# Patient Record
Sex: Male | Born: 1990 | Race: White | Hispanic: No | Marital: Single | Smoking: Never smoker
Health system: Southern US, Community
[De-identification: ages and names within clinical notes are randomized; demographics above are authoritative.]

## PROBLEM LIST (undated history)

## (undated) HISTORY — PX: KNEE SURGERY: SHX244

## (undated) HISTORY — PX: INCISION AND DRAINAGE ABSCESS: SHX5864

---
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
## 2012-03-13 ENCOUNTER — Emergency Department (HOSPITAL_COMMUNITY): Admission: EM | Admit: 2012-03-13 | Payer: BC Managed Care – PPO | Source: Home / Self Care
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
## 2012-11-01 ENCOUNTER — Ambulatory Visit
Admission: RE | Admit: 2012-11-01 | Discharge: 2012-11-01 | Disposition: A | Discharge status: Home / Self Care | Payer: No Typology Code available for payment source | Source: Ambulatory Visit | Attending: Physical Medicine and Rehabilitation | Admitting: Physical Medicine and Rehabilitation

## 2012-11-01 ENCOUNTER — Other Ambulatory Visit: Payer: Self-pay | Admitting: Physical Medicine and Rehabilitation

## 2012-11-01 DIAGNOSIS — Z0289 Encounter for other administrative examinations: Secondary | ICD-10-CM

## 2013-03-11 ENCOUNTER — Encounter (HOSPITAL_COMMUNITY): Payer: Self-pay | Admitting: Emergency Medicine

## 2013-03-11 ENCOUNTER — Inpatient Hospital Stay (HOSPITAL_COMMUNITY)
Admission: EM | Admit: 2013-03-11 | Discharge: 2013-03-15 | DRG: 872 | Disposition: A | Discharge status: Home / Self Care | Payer: 59 | Attending: Internal Medicine | Admitting: Internal Medicine

## 2013-03-11 ENCOUNTER — Emergency Department (HOSPITAL_COMMUNITY): Payer: 59

## 2013-03-11 DIAGNOSIS — E109 Type 1 diabetes mellitus without complications: Secondary | ICD-10-CM | POA: Diagnosis present

## 2013-03-11 DIAGNOSIS — E860 Dehydration: Secondary | ICD-10-CM | POA: Diagnosis present

## 2013-03-11 DIAGNOSIS — R748 Abnormal levels of other serum enzymes: Secondary | ICD-10-CM | POA: Diagnosis present

## 2013-03-11 DIAGNOSIS — Z794 Long term (current) use of insulin: Secondary | ICD-10-CM

## 2013-03-11 DIAGNOSIS — R6510 Systemic inflammatory response syndrome (SIRS) of non-infectious origin without acute organ dysfunction: Secondary | ICD-10-CM | POA: Diagnosis present

## 2013-03-11 DIAGNOSIS — B349 Viral infection, unspecified: Secondary | ICD-10-CM

## 2013-03-11 DIAGNOSIS — J209 Acute bronchitis, unspecified: Secondary | ICD-10-CM | POA: Diagnosis present

## 2013-03-11 DIAGNOSIS — B9789 Other viral agents as the cause of diseases classified elsewhere: Secondary | ICD-10-CM | POA: Diagnosis present

## 2013-03-11 DIAGNOSIS — R21 Rash and other nonspecific skin eruption: Secondary | ICD-10-CM

## 2013-03-11 DIAGNOSIS — R197 Diarrhea, unspecified: Secondary | ICD-10-CM

## 2013-03-11 DIAGNOSIS — E119 Type 2 diabetes mellitus without complications: Secondary | ICD-10-CM | POA: Diagnosis present

## 2013-03-11 DIAGNOSIS — R109 Unspecified abdominal pain: Secondary | ICD-10-CM

## 2013-03-11 DIAGNOSIS — A088 Other specified intestinal infections: Secondary | ICD-10-CM | POA: Diagnosis present

## 2013-03-11 DIAGNOSIS — I959 Hypotension, unspecified: Secondary | ICD-10-CM | POA: Diagnosis present

## 2013-03-11 DIAGNOSIS — A419 Sepsis, unspecified organism: Secondary | ICD-10-CM | POA: Diagnosis present

## 2013-03-11 DIAGNOSIS — R1110 Vomiting, unspecified: Secondary | ICD-10-CM

## 2013-03-11 DIAGNOSIS — Z833 Family history of diabetes mellitus: Secondary | ICD-10-CM

## 2013-03-11 DIAGNOSIS — R112 Nausea with vomiting, unspecified: Secondary | ICD-10-CM

## 2013-03-11 LAB — COMPREHENSIVE METABOLIC PANEL
ALT: 44 U/L (ref 0–53)
AST: 64 U/L — ABNORMAL HIGH (ref 0–37)
Albumin: 3.4 g/dL — ABNORMAL LOW (ref 3.5–5.2)
Alkaline Phosphatase: 102 U/L (ref 39–117)
BUN: 20 mg/dL (ref 6–23)
CO2: 22 mEq/L (ref 19–32)
Calcium: 9.1 mg/dL (ref 8.4–10.5)
Chloride: 99 mEq/L (ref 96–112)
Creatinine, Ser: 1.2 mg/dL (ref 0.50–1.35)
GFR calc Af Amer: >90 mL/min (ref >90)
GFR calc non Af Amer: 85 mL/min — ABNORMAL LOW (ref >90)
Glucose, Bld: 263 mg/dL — ABNORMAL HIGH (ref 70–99)
Potassium: 3.4 mEq/L — ABNORMAL LOW (ref 3.7–5.3)
Sodium: 139 mEq/L (ref 137–147)
Total Bilirubin: 0.6 mg/dL (ref 0.3–1.2)
Total Protein: 6.9 g/dL (ref 6.0–8.3)

## 2013-03-11 LAB — BASIC METABOLIC PANEL
BUN: 19 mg/dL (ref 6–23)
CO2: 21 mEq/L (ref 19–32)
Calcium: 7.1 mg/dL — ABNORMAL LOW (ref 8.4–10.5)
Chloride: 102 mEq/L (ref 96–112)
Creatinine, Ser: 1.12 mg/dL (ref 0.50–1.35)
GFR calc Af Amer: >90 mL/min (ref >90)
GFR calc non Af Amer: >90 mL/min (ref >90)
Glucose, Bld: 277 mg/dL — ABNORMAL HIGH (ref 70–99)
Potassium: 3.5 mEq/L — ABNORMAL LOW (ref 3.7–5.3)
Sodium: 134 mEq/L — ABNORMAL LOW (ref 137–147)

## 2013-03-11 LAB — CBC WITH DIFFERENTIAL/PLATELET
Basophils Absolute: 0 10*3/uL (ref 0.0–0.1)
Basophils Relative: 0 % (ref 0–1)
Eosinophils Absolute: 0.1 10*3/uL (ref 0.0–0.7)
Eosinophils Relative: 1 % (ref 0–5)
HCT: 45.4 % (ref 39.0–52.0)
Hemoglobin: 16.1 g/dL (ref 13.0–17.0)
Lymphocytes Relative: 4 % — ABNORMAL LOW (ref 12–46)
Lymphs Abs: 0.3 10*3/uL — ABNORMAL LOW (ref 0.7–4.0)
MCH: 31.6 pg (ref 26.0–34.0)
MCHC: 35.5 g/dL (ref 30.0–36.0)
MCV: 89 fL (ref 78.0–100.0)
Monocytes Absolute: 0.5 10*3/uL (ref 0.1–1.0)
Monocytes Relative: 6 % (ref 3–12)
Neutro Abs: 7.3 10*3/uL (ref 1.7–7.7)
Neutrophils Relative %: 90 % — ABNORMAL HIGH (ref 43–77)
Platelets: 213 10*3/uL (ref 150–400)
RBC: 5.1 MIL/uL (ref 4.22–5.81)
RDW: 13.4 % (ref 11.5–15.5)
WBC: 8.2 10*3/uL (ref 4.0–10.5)

## 2013-03-11 LAB — URINALYSIS, ROUTINE W REFLEX MICROSCOPIC
Glucose, UA: 250 mg/dL — AB
Hgb urine dipstick: NEGATIVE
Leukocytes, UA: NEGATIVE
Nitrite: NEGATIVE
Protein, ur: 100 mg/dL — AB
Specific Gravity, Urine: 1.015 (ref 1.005–1.030)
Urobilinogen, UA: 0.2 mg/dL (ref 0.0–1.0)
pH: 6 (ref 5.0–8.0)

## 2013-03-11 LAB — URINE MICROSCOPIC-ADD ON

## 2013-03-11 LAB — CG4 I-STAT (LACTIC ACID): Lactic Acid, Venous: 1.49 mmol/L (ref 0.5–2.2)

## 2013-03-11 LAB — LIPASE, BLOOD: Lipase: 152 U/L — ABNORMAL HIGH (ref 11–59)

## 2013-03-11 LAB — GLUCOSE, CAPILLARY: Glucose-Capillary: 221 mg/dL — ABNORMAL HIGH (ref 70–99)

## 2013-03-11 MED ORDER — ENOXAPARIN SODIUM 40 MG/0.4ML ~~LOC~~ SOLN
40.0000 mg | SUBCUTANEOUS | Status: DC
  Administered 2013-03-12 – 2013-03-13 (×2): 40 mg via SUBCUTANEOUS
  Filled 2013-03-11 (×2): qty 0.4

## 2013-03-11 MED ORDER — INSULIN ASPART 100 UNIT/ML ~~LOC~~ SOLN
0.0000 [IU] | Freq: Every day | SUBCUTANEOUS | Status: DC
  Administered 2013-03-12 – 2013-03-13 (×2): 2 [IU] via SUBCUTANEOUS
  Administered 2013-03-14: 3 [IU] via SUBCUTANEOUS

## 2013-03-11 MED ORDER — SODIUM CHLORIDE 0.9 % IV SOLN
1000.0000 mL | INTRAVENOUS | Status: DC
  Administered 2013-03-12 (×2): 1000 mL via INTRAVENOUS

## 2013-03-11 MED ORDER — ONDANSETRON HCL 4 MG PO TABS: 4.0000 mg | ORAL_TABLET | Freq: Four times a day (QID) | ORAL | Status: DC

## 2013-03-11 MED ORDER — ONDANSETRON HCL 4 MG/2ML IJ SOLN: 4.0000 mg | Freq: Three times a day (TID) | INTRAMUSCULAR | Status: AC | PRN

## 2013-03-11 MED ORDER — POTASSIUM CHLORIDE CRYS ER 20 MEQ PO TBCR
40.0000 meq | EXTENDED_RELEASE_TABLET | ORAL | Status: AC
  Administered 2013-03-12 (×2): 40 meq via ORAL
  Filled 2013-03-11 (×2): qty 2

## 2013-03-11 MED ORDER — ONDANSETRON HCL 4 MG/2ML IJ SOLN
4.0000 mg | Freq: Once | INTRAMUSCULAR | Status: AC
  Administered 2013-03-11: 4 mg via INTRAVENOUS
  Filled 2013-03-11: qty 2

## 2013-03-11 MED ORDER — INSULIN ASPART 100 UNIT/ML ~~LOC~~ SOLN
0.0000 [IU] | Freq: Three times a day (TID) | SUBCUTANEOUS | Status: DC
  Administered 2013-03-12: 2 [IU] via SUBCUTANEOUS
  Administered 2013-03-12: 5 [IU] via SUBCUTANEOUS
  Administered 2013-03-12: 3 [IU] via SUBCUTANEOUS
  Administered 2013-03-13: 2 [IU] via SUBCUTANEOUS
  Administered 2013-03-13 (×2): 3 [IU] via SUBCUTANEOUS
  Administered 2013-03-14: 2 [IU] via SUBCUTANEOUS
  Administered 2013-03-14 (×2): 3 [IU] via SUBCUTANEOUS
  Administered 2013-03-15: 2 [IU] via SUBCUTANEOUS

## 2013-03-11 MED ORDER — IBUPROFEN 800 MG PO TABS
800.0000 mg | ORAL_TABLET | Freq: Once | ORAL | Status: AC
  Administered 2013-03-11: 800 mg via ORAL
  Filled 2013-03-11: qty 1

## 2013-03-11 MED ORDER — ACETAMINOPHEN 650 MG RE SUPP: 650.0000 mg | Freq: Four times a day (QID) | RECTAL | Status: DC | PRN

## 2013-03-11 MED ORDER — SODIUM CHLORIDE 0.9 % IV SOLN
1000.0000 mL | Freq: Once | INTRAVENOUS | Status: AC
  Administered 2013-03-11: 1000 mL via INTRAVENOUS

## 2013-03-11 MED ORDER — OXYCODONE HCL 5 MG PO TABS: 5.0000 mg | ORAL_TABLET | ORAL | Status: DC | PRN

## 2013-03-11 MED ORDER — IOHEXOL 300 MG/ML  SOLN
50.0000 mL | Freq: Once | INTRAMUSCULAR | Status: AC | PRN
  Administered 2013-03-11: 50 mL via ORAL

## 2013-03-11 MED ORDER — DIPHENOXYLATE-ATROPINE 2.5-0.025 MG PO TABS: 2.0000 | ORAL_TABLET | Freq: Four times a day (QID) | ORAL | Status: DC | PRN

## 2013-03-11 MED ORDER — MORPHINE SULFATE 4 MG/ML IJ SOLN
4.0000 mg | Freq: Once | INTRAMUSCULAR | Status: AC
  Administered 2013-03-11: 4 mg via INTRAVENOUS
  Filled 2013-03-11: qty 1

## 2013-03-11 MED ORDER — SODIUM CHLORIDE 0.9 % IV SOLN: INTRAVENOUS | Status: DC

## 2013-03-11 MED ORDER — SODIUM CHLORIDE 0.9 % IV BOLUS (SEPSIS)
1000.0000 mL | Freq: Once | INTRAVENOUS | Status: AC
  Administered 2013-03-11: 1000 mL via INTRAVENOUS

## 2013-03-11 MED ORDER — SODIUM CHLORIDE 0.9 % IV SOLN
1000.0000 mL | Freq: Once | INTRAVENOUS | Status: AC
  Administered 2013-03-11 – 2013-03-12 (×2): 1000 mL via INTRAVENOUS

## 2013-03-11 MED ORDER — OSELTAMIVIR PHOSPHATE 75 MG PO CAPS
75.0000 mg | ORAL_CAPSULE | Freq: Two times a day (BID) | ORAL | Status: DC
  Administered 2013-03-11: 75 mg via ORAL
  Filled 2013-03-11: qty 1

## 2013-03-11 MED ORDER — ACETAMINOPHEN 500 MG PO TABS
1000.0000 mg | ORAL_TABLET | Freq: Once | ORAL | Status: AC
  Administered 2013-03-11: 1000 mg via ORAL
  Filled 2013-03-11: qty 2

## 2013-03-11 MED ORDER — IOHEXOL 300 MG/ML  SOLN
100.0000 mL | Freq: Once | INTRAMUSCULAR | Status: AC | PRN
  Administered 2013-03-11: 100 mL via INTRAVENOUS

## 2013-03-11 MED ORDER — INSULIN GLARGINE 100 UNIT/ML ~~LOC~~ SOLN
34.0000 [IU] | Freq: Every day | SUBCUTANEOUS | Status: DC
  Administered 2013-03-12 – 2013-03-14 (×4): 34 [IU] via SUBCUTANEOUS
  Filled 2013-03-11 (×4): qty 0.34

## 2013-03-11 MED ORDER — ACETAMINOPHEN 325 MG PO TABS
650.0000 mg | ORAL_TABLET | Freq: Four times a day (QID) | ORAL | Status: DC | PRN
  Administered 2013-03-12 – 2013-03-15 (×7): 650 mg via ORAL
  Filled 2013-03-11 (×7): qty 2

## 2013-03-11 NOTE — ED Provider Notes (Addendum)
CSN: [id number]     Arrival date & time 03/11/13  1518 History   First MD Initiated Contact with Patient 03/11/13 1600     Chief Complaint  Patient presents with  . Fever  . Emesis     (Consider location/radiation/quality/duration/timing/severity/associated sxs/prior Treatment) HPI Comments: Patient presents to ER for evaluation of nausea and vomiting. Patient reports that he awakened this morning with fever, chills, body aches. He developed headache, cough and then noticed vomiting and diarrhea. Symptoms have persisted through the course of today. She reports diffuse abdominal cramping which is waxing and waning in nature. He is a diabetic, blood sugars have been slightly elevated.  Patient is a 23 y.o. male presenting with fever and vomiting.  Fever Associated symptoms: chills, cough, diarrhea, myalgias, nausea and vomiting   Emesis Associated symptoms: abdominal pain, chills, diarrhea and myalgias     Past Medical History  Diagnosis Date  . Diabetes mellitus     insulin pump   History reviewed. No pertinent past surgical history. Family History  Problem Relation Age of Onset  . Diabetes     History  Substance Use Topics  . Smoking status: Never Smoker   . Smokeless tobacco: Never Used  . Alcohol Use: No    Review of Systems  Constitutional: Positive for fever and chills.  Respiratory: Positive for cough.   Gastrointestinal: Positive for nausea, vomiting, abdominal pain and diarrhea.  Musculoskeletal: Positive for myalgias.  All other systems reviewed and are negative.      Allergies  Amoxicillin  Home Medications   Current Outpatient Rx  Name  Route  Sig  Dispense  Refill  . insulin glargine (LANTUS) 100 UNIT/ML injection   Subcutaneous   Inject 34 Units into the skin at bedtime.         . insulin glulisine (APIDRA) 100 UNIT/ML injection   Subcutaneous   Inject 10-16 Units into the skin 3 (three) times daily before meals. According to sliding scale at  home         . diphenoxylate-atropine (LOMOTIL) 2.5-0.025 MG per tablet   Oral   Take 2 tablets by mouth 4 (four) times daily as needed for diarrhea or loose stools.   15 tablet   0   . ondansetron (ZOFRAN) 4 MG tablet   Oral   Take 1 tablet (4 mg total) by mouth every 6 (six) hours.   12 tablet   0    BP 92/32  Pulse 132  Temp(Src) 101.7 F (38.7 C) (Oral)  Resp 18  Ht 5\' 7"  (1.702 m)  Wt 165 lb (74.844 kg)  BMI 25.84 kg/m2  SpO2 99% Physical Exam  Constitutional: He is oriented to person, place, and time. He appears well-developed and well-nourished. No distress.  HENT:  Head: Normocephalic and atraumatic.  Right Ear: Hearing normal.  Left Ear: Hearing normal.  Nose: Nose normal.  Mouth/Throat: Oropharynx is clear and moist and mucous membranes are normal.  Eyes: Conjunctivae and EOM are normal. Pupils are equal, round, and reactive to light.  Neck: Normal range of motion. Neck supple.  Cardiovascular: Regular rhythm, S1 normal and S2 normal.  Exam reveals no gallop and no friction rub.   No murmur heard. Pulmonary/Chest: Effort normal and breath sounds normal. No respiratory distress. He exhibits no tenderness.  Abdominal: Soft. Normal appearance and bowel sounds are normal. There is no hepatosplenomegaly. There is tenderness in the right upper quadrant, epigastric area, periumbilical area and left upper quadrant. There is no rebound, no  guarding, no tenderness at McBurney's point and negative Murphy's sign. No hernia.  Musculoskeletal: Normal range of motion.  Neurological: He is alert and oriented to person, place, and time. He has normal strength. No cranial nerve deficit or sensory deficit. Coordination normal. GCS eye subscore is 4. GCS verbal subscore is 5. GCS motor subscore is 6.  Skin: Skin is warm, dry and intact. No rash noted. No cyanosis.  Psychiatric: He has a normal mood and affect. His speech is normal and behavior is normal. Thought content normal.     ED Course  Procedures (including critical care time) Labs Review Labs Reviewed  GLUCOSE, CAPILLARY - Abnormal; Notable for the following:    Glucose-Capillary 221 (*)    All other components within normal limits  CBC WITH DIFFERENTIAL - Abnormal; Notable for the following:    Neutrophils Relative % 90 (*)    Lymphocytes Relative 4 (*)    Lymphs Abs 0.3 (*)    All other components within normal limits  COMPREHENSIVE METABOLIC PANEL - Abnormal; Notable for the following:    Potassium 3.4 (*)    Glucose, Bld 263 (*)    Albumin 3.4 (*)    AST 64 (*)    GFR calc non Af Amer 85 (*)    All other components within normal limits  LIPASE, BLOOD - Abnormal; Notable for the following:    Lipase 152 (*)    All other components within normal limits  URINALYSIS, ROUTINE W REFLEX MICROSCOPIC - Abnormal; Notable for the following:    Color, Urine ORANGE (*)    Glucose, UA 250 (*)    Bilirubin Urine SMALL (*)    Ketones, ur TRACE (*)    Protein, ur 100 (*)    All other components within normal limits  URINE MICROSCOPIC-ADD ON - Abnormal; Notable for the following:    Casts HYALINE CASTS (*)    All other components within normal limits  CULTURE, BLOOD (ROUTINE X 2)  CULTURE, BLOOD (ROUTINE X 2)  CG4 I-STAT (LACTIC ACID)   Imaging Review Dg Chest 2 View  03/11/2013   CLINICAL DATA:  Stomach ache, vomiting, cough  EXAM: CHEST  2 VIEW  COMPARISON:  DG CHEST 1 VIEW dated 11/01/2012; DG CHEST 2 VIEW dated 06/28/2011  FINDINGS: Unchanged cardiac silhouette and mediastinal contours. No focal airspace opacities. No pleural effusion or pneumothorax. No evidence of edema. Unchanged bones.  IMPRESSION: No acute cardiopulmonary disease.   Electronically Signed   By: Simonne Come M.D.   On: 03/11/2013 21:40   Ct Abdomen Pelvis W Contrast  03/11/2013   CLINICAL DATA:  Abdominal pain.  EXAM: CT ABDOMEN AND PELVIS WITH CONTRAST  TECHNIQUE: Multidetector CT imaging of the abdomen and pelvis was performed  using the standard protocol following bolus administration of intravenous contrast.  CONTRAST:  50mL OMNIPAQUE IOHEXOL 300 MG/ML SOLN, OMNIPAQUE IOHEXOL 300 MG/ML SOLN  COMPARISON:  CT scan of April 09, 2010.  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 Visualized lung bases appear normal.  The liver, spleen and pancreas appear normal. No gallstones are noted. Adrenal glands appear normal. No hydronephrosis or renal obstruction is noted. Small nonobstructive calculus is seen in left kidney. No ureteral calculi are noted. The appendix appears normal. There is no evidence of bowel obstruction. Upper abdominal and mesenteric adenopathy is again noted and unchanged compared to prior exam. Bilateral inguinal adenopathy is also noted and unchanged. There is continued uniform wall thickening of the urinary  bladder whichwas present on prior exam. No abnormal fluid collection is noted.  IMPRESSION: Small nonobstructive left renal calculus. No hydronephrosis or renal obstruction is seen.  Stable adenopathy is noted in the retroperitoneal, mesenteric and bilateral inguinal regions which is unchanged compared to prior exam of 2012, and therefore most likely is inflammatory or reactive in nature.  Uniform wall thickening of urinary bladder is noted which was present on prior exam ; this is concerning for chronic cystitis or inflammation.   Electronically Signed   By: Roque LiasJames  Green M.D.   On: 03/11/2013 19:07    EKG Interpretation    Date/Time:  Friday March 11 2013 16:17:59 EST Ventricular Rate:  131 PR Interval:  150 QRS Duration: 98 QT Interval:  288 QTC Calculation: 425 R Axis:   52 Text Interpretation:  Sinus tachycardia Nonspecific ST and T wave abnormality Abnormal ECG When compared with ECG of 27-Dec-2007 08:40,  tachycardia is present Confirmed by Greenleigh Kauth  MD, Atziri Zubiate ([id number]) on 03/11/2013 5:32:06 PM            MDM   Final diagnoses:  Viral Syndrome Dehydration    Patient presents to ER with flulike symptoms. He denies fever, chills, myalgias, headache, respiratory symptoms as well as vomiting and diarrhea. Symptoms began early this morning. Patient was slightly hyperglycemic, does have a history of diabetes. Patient treated with IV fluids and Zofran, analgesia. Blood work is essentially unremarkable other than slightly elevated lipase. This has been seen previously in the patient, but because of this, CAT scan was obtained. No acute abnormalities are seen. Patient does not have any pain or tenderness globally about his, low suspicion for appendicitis. CAT scan did not show any inflammatory changes other than thickening of the bladder wall which has been seen previously.   Patient continued to be febrile here in the ER despite treatment. I believe this is the cause of the patient's tachycardia. After 2 L of fluid he was feeling much improved. Blood pressure is on the low side, but looking back at previous visits he has had a pressure in the 90s to 100 frequently during his visits. His lactate is negative. I do not think he is septic. Blood cultures are pending, will be followed.  Patient indicated that he was feeling better, but still was hypotensive, blood pressure in the 80s. Additionally he remained tachycardic. After a second liter of fluid, patient was ambulated. His blood pressure did not drop however he became significantly tachycardic, about 160 with standing. This indicates significant dehydration and I recommended admission to the patient for further hydration.    Gilda Creasehristopher J. Dalaysia Harms, MD 03/11/13 [id number]  Gilda Creasehristopher J. Takoya Jonas, MD 03/11/13 2200  Gilda Creasehristopher J. Ardith Lewman, MD 03/11/13 2223

## 2013-03-11 NOTE — ED Notes (Addendum)
Patient c/o flu like symptoms- body aches, vomiting, diarrhea, fevers, headache, and coughing that started this morning . Per patient sputum thick and yellow. Per father patient was fatigue yesterday.

## 2013-03-11 NOTE — H&P (Signed)
PCP:   WILLARD,JENNIFER, PA-C   Chief Complaint:  Nausea vomiting  HPI:  23 year old male with a past medical history of diabetes mellitus, who came to the ED after patient started having symptoms of cough, nausea vomiting and diarrhea which started this morning. As per patient he noticed that he was coughing up yellow phlegm and then had almost 20 episodes of vomiting and 3 episodes of diarrhea. He denies any blood in the vomitus or stool. Denies any sick contacts at home. He denies any chest pain or shortness of breath associated with them. He did have abdominal pain which was cramping intact her and 5/10 in intensity located around the umbilicus. Patient also has diabetes and takes Lantus and sliding scale Apidra at home, In the ED CAT scan of the abdomen was done which did not show significant abnormality. Chest x-ray is normal, patient continues to be hypotensive despite getting IV fluids boluses. Patient also has lactic acid of 1.49. WBC is normal, has mild elevation of lipase 152. Blood glucose is 263. He admits to having fever, temperature  is 101.7.  Allergies:   Allergies  Allergen Reactions  . Amoxicillin Rash      Past Medical History  Diagnosis Date  . Diabetes mellitus     insulin pump    History reviewed. No pertinent past surgical history.  Prior to Admission medications   Medication Sig Start Date End Date Taking? Authorizing Provider  insulin glargine (LANTUS) 100 UNIT/ML injection Inject 34 Units into the skin at bedtime.   Yes Historical Provider, MD  insulin glulisine (APIDRA) 100 UNIT/ML injection Inject 10-16 Units into the skin 3 (three) times daily before meals. According to sliding scale at home   Yes Historical Provider, MD    Social History:  reports that he has never smoked. He has never used smokeless tobacco. He reports that he does not drink alcohol or use illicit drugs.  Family History  Problem Relation Age of Onset  . Diabetes       All the  positives are listed in BOLD  Review of Systems:  HEENT: Headache, blurred vision, runny nose, sore throat Neck: Hypothyroidism, hyperthyroidism,,lymphadenopathy Chest : Shortness of breath, history of COPD, Asthma Heart : Chest pain, history of coronary arterey disease GI:  Nausea, vomiting, diarrhea, constipation, GERD GU: Dysuria, urgency, frequency of urination, hematuria Neuro: Stroke, seizures, syncope Psych: Depression, anxiety, hallucinations   Physical Exam: Blood pressure 82/52, pulse 161, temperature 101.7 F (38.7 C), temperature source Oral, resp. rate 18, height 5' 7"  (1.702 m), weight 74.844 kg (165 lb), SpO2 99.00%. Constitutional:   Patient is a well-developed and well-nourished male* in no acute distress and cooperative with exam. Head: Normocephalic and atraumatic Mouth: Mucus membranes moist Eyes: PERRL, EOMI, conjunctivae normal Neck: Supple, No Thyromegaly Cardiovascular: RRR, S1 normal, S2 normal Pulmonary/Chest: CTAB, no wheezes, rales, or rhonchi Abdominal: Soft. Non-tender, non-distended, bowel sounds are normal, no masses, organomegaly, or guarding present.  Neurological: A&O x3, Strenght is normal and symmetric bilaterally, cranial nerve II-XII are grossly intact, no focal motor deficit, sensory intact to light touch bilaterally.  Extremities : No Cyanosis, Clubbing or Edema   Labs on Admission:  Results for orders placed during the hospital encounter of 03/11/13 (from the past 48 hour(s))  URINALYSIS, ROUTINE W REFLEX MICROSCOPIC     Status: Abnormal   Collection Time    03/11/13  3:30 PM      Result Value Ref Range   Color, Urine ORANGE (*) YELLOW  Comment: BIOCHEMICALS MAY BE AFFECTED BY COLOR   APPearance CLEAR  CLEAR   Specific Gravity, Urine 1.015  1.005 - 1.030   pH 6.0  5.0 - 8.0   Glucose, UA 250 (*) NEGATIVE mg/dL   Hgb urine dipstick NEGATIVE  NEGATIVE   Bilirubin Urine SMALL (*) NEGATIVE   Ketones, ur TRACE (*) NEGATIVE mg/dL    Protein, ur 100 (*) NEGATIVE mg/dL   Urobilinogen, UA 0.2  0.0 - 1.0 mg/dL   Nitrite NEGATIVE  NEGATIVE   Leukocytes, UA NEGATIVE  NEGATIVE  URINE MICROSCOPIC-ADD ON     Status: Abnormal   Collection Time    03/11/13  3:30 PM      Result Value Ref Range   WBC, UA 3-6  <3 WBC/hpf   Bacteria, UA RARE  RARE   Casts HYALINE CASTS (*) NEGATIVE  GLUCOSE, CAPILLARY     Status: Abnormal   Collection Time    03/11/13  4:01 PM      Result Value Ref Range   Glucose-Capillary 221 (*) 70 - 99 mg/dL  CBC WITH DIFFERENTIAL     Status: Abnormal   Collection Time    03/11/13  4:28 PM      Result Value Ref Range   WBC 8.2  4.0 - 10.5 K/uL   RBC 5.10  4.22 - 5.81 MIL/uL   Hemoglobin 16.1  13.0 - 17.0 g/dL   HCT 45.4  39.0 - 52.0 %   MCV 89.0  78.0 - 100.0 fL   MCH 31.6  26.0 - 34.0 pg   MCHC 35.5  30.0 - 36.0 g/dL   RDW 13.4  11.5 - 15.5 %   Platelets 213  150 - 400 K/uL   Neutrophils Relative % 90 (*) 43 - 77 %   Neutro Abs 7.3  1.7 - 7.7 K/uL   Lymphocytes Relative 4 (*) 12 - 46 %   Lymphs Abs 0.3 (*) 0.7 - 4.0 K/uL   Monocytes Relative 6  3 - 12 %   Monocytes Absolute 0.5  0.1 - 1.0 K/uL   Eosinophils Relative 1  0 - 5 %   Eosinophils Absolute 0.1  0.0 - 0.7 K/uL   Basophils Relative 0  0 - 1 %   Basophils Absolute 0.0  0.0 - 0.1 K/uL  COMPREHENSIVE METABOLIC PANEL     Status: Abnormal   Collection Time    03/11/13  4:28 PM      Result Value Ref Range   Sodium 139  137 - 147 mEq/L   Potassium 3.4 (*) 3.7 - 5.3 mEq/L   Chloride 99  96 - 112 mEq/L   CO2 22  19 - 32 mEq/L   Glucose, Bld 263 (*) 70 - 99 mg/dL   BUN 20  6 - 23 mg/dL   Creatinine, Ser 1.20  0.50 - 1.35 mg/dL   Calcium 9.1  8.4 - 10.5 mg/dL   Total Protein 6.9  6.0 - 8.3 g/dL   Albumin 3.4 (*) 3.5 - 5.2 g/dL   AST 64 (*) 0 - 37 U/L   ALT 44  0 - 53 U/L   Alkaline Phosphatase 102  39 - 117 U/L   Total Bilirubin 0.6  0.3 - 1.2 mg/dL   GFR calc non Af Amer 85 (*) >90 mL/min   GFR calc Af Amer >90  >90 mL/min    Comment: (NOTE)     The eGFR has been calculated using the CKD EPI equation.  This calculation has not been validated in all clinical situations.     eGFR's persistently <90 mL/min signify possible Chronic Kidney     Disease.  LIPASE, BLOOD     Status: Abnormal   Collection Time    03/11/13  4:28 PM      Result Value Ref Range   Lipase 152 (*) 11 - 59 U/L  CG4 I-STAT (LACTIC ACID)     Status: None   Collection Time    03/11/13  9:36 PM      Result Value Ref Range   Lactic Acid, Venous 1.49  0.5 - 2.2 mmol/L    Radiological Exams on Admission: Dg Chest 2 View  03/11/2013   CLINICAL DATA:  Stomach ache, vomiting, cough  EXAM: CHEST  2 VIEW  COMPARISON:  DG CHEST 1 VIEW dated 11/01/2012; DG CHEST 2 VIEW dated 06/28/2011  FINDINGS: Unchanged cardiac silhouette and mediastinal contours. No focal airspace opacities. No pleural effusion or pneumothorax. No evidence of edema. Unchanged bones.  IMPRESSION: No acute cardiopulmonary disease.   Electronically Signed   By: Sandi Mariscal M.D.   On: 03/11/2013 21:40   Ct Abdomen Pelvis W Contrast  03/11/2013   CLINICAL DATA:  Abdominal pain.  EXAM: CT ABDOMEN AND PELVIS WITH CONTRAST  TECHNIQUE: Multidetector CT imaging of the abdomen and pelvis was performed using the standard protocol following bolus administration of intravenous contrast.  CONTRAST:  25m OMNIPAQUE IOHEXOL 300 MG/ML SOLN, 107mOMNIPAQUE IOHEXOL 300 MG/ML SOLN  COMPARISON:  CT scan of April 09, 2010.  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 Visualized lung bases appear normal.  The liver, spleen and pancreas appear normal. No gallstones are noted. Adrenal glands appear normal. No hydronephrosis or renal obstruction is noted. Small nonobstructive calculus is seen in left kidney. No ureteral calculi are noted. The appendix appears normal. There is no evidence of bowel obstruction. Upper abdominal and mesenteric adenopathy is again noted and unchanged compared to prior  exam. Bilateral inguinal adenopathy is also noted and unchanged. There is continued uniform wall thickening of the urinary bladder whichwas present on prior exam. No abnormal fluid collection is noted.  IMPRESSION: Small nonobstructive left renal calculus. No hydronephrosis or renal obstruction is seen.  Stable adenopathy is noted in the retroperitoneal, mesenteric and bilateral inguinal regions which is unchanged compared to prior exam of 2012, and therefore most likely is inflammatory or reactive in nature.  Uniform wall thickening of urinary bladder is noted which was present on prior exam ; this is concerning for chronic cystitis or inflammation.   Electronically Signed   By: JaSabino Dick.D.   On: 03/11/2013 19:07    Assessment/Plan Active Problems:   Acute bronchitis   Dehydration   DM (diabetes mellitus)  Dehydration Patient presented with nausea vomiting, has profound dehydration with hypotension. He has received 2 L of normal saline in the ED, we'll continue normal saline at 125 mL per hour. Patient will be admitted to the step down unit Patient's chloride was 99, bicarbonate 22. Sodium 139, chloride is low with high sodium due to dehydration, glucose 223. Does not appear to be DKA. Will repeat BMP stat.  Acute bronchitis Patient presented with symptoms of cough with phlegm, we'll start the patient empirically on Levaquin. Influenza PCR has been obtained, will also start Tamiflu empirically.  Diabetes mellitus Continue her Lantus 34 units at bedtime, and initiate sliding scale insulin.   DVT prophylaxis Lovenox   Code status: Full  code  Family discussion: Discussed with patient's father at bedside   Time Spent on Admission: 60 minutes  [facility] Pager: [phone] 03/11/2013, 10:58 PM  If 7PM-7AM, please contact night-coverage  [email or web address]  Password [email or web address]  And

## 2013-03-12 ENCOUNTER — Encounter (HOSPITAL_COMMUNITY): Payer: Self-pay | Admitting: *Deleted

## 2013-03-12 ENCOUNTER — Observation Stay (HOSPITAL_COMMUNITY): Payer: 59

## 2013-03-12 DIAGNOSIS — R197 Diarrhea, unspecified: Secondary | ICD-10-CM | POA: Diagnosis present

## 2013-03-12 DIAGNOSIS — R748 Abnormal levels of other serum enzymes: Secondary | ICD-10-CM | POA: Diagnosis not present

## 2013-03-12 DIAGNOSIS — R6510 Systemic inflammatory response syndrome (SIRS) of non-infectious origin without acute organ dysfunction: Secondary | ICD-10-CM | POA: Diagnosis present

## 2013-03-12 DIAGNOSIS — I959 Hypotension, unspecified: Secondary | ICD-10-CM | POA: Diagnosis present

## 2013-03-12 DIAGNOSIS — R112 Nausea with vomiting, unspecified: Secondary | ICD-10-CM | POA: Diagnosis present

## 2013-03-12 LAB — CBC
HCT: 37.5 % — ABNORMAL LOW (ref 39.0–52.0)
Hemoglobin: 13.2 g/dL (ref 13.0–17.0)
MCH: 31.3 pg (ref 26.0–34.0)
MCHC: 35.2 g/dL (ref 30.0–36.0)
MCV: 88.9 fL (ref 78.0–100.0)
Platelets: 157 10*3/uL (ref 150–400)
RBC: 4.22 MIL/uL (ref 4.22–5.81)
RDW: 13.6 % (ref 11.5–15.5)
WBC: 8.9 10*3/uL (ref 4.0–10.5)

## 2013-03-12 LAB — RAPID URINE DRUG SCREEN, HOSP PERFORMED
Amphetamines: NOT DETECTED
Barbiturates: NOT DETECTED
Benzodiazepines: NOT DETECTED
Cocaine: NOT DETECTED
Opiates: NOT DETECTED
Tetrahydrocannabinol: NOT DETECTED

## 2013-03-12 LAB — HEPATITIS PANEL, ACUTE
HCV Ab: NEGATIVE
Hep A IgM: NONREACTIVE
Hep B C IgM: NONREACTIVE
Hepatitis B Surface Ag: NEGATIVE

## 2013-03-12 LAB — MRSA PCR SCREENING: MRSA by PCR: NEGATIVE

## 2013-03-12 LAB — INFLUENZA PANEL BY PCR (TYPE A & B)
H1N1 flu by pcr: NOT DETECTED
Influenza A By PCR: NEGATIVE
Influenza B By PCR: NEGATIVE

## 2013-03-12 LAB — CORTISOL: Cortisol, Plasma: 19.3 ug/dL

## 2013-03-12 LAB — COMPREHENSIVE METABOLIC PANEL
ALT: 63 U/L — ABNORMAL HIGH (ref 0–53)
AST: 105 U/L — ABNORMAL HIGH (ref 0–37)
Albumin: 2.2 g/dL — ABNORMAL LOW (ref 3.5–5.2)
Alkaline Phosphatase: 85 U/L (ref 39–117)
BUN: 20 mg/dL (ref 6–23)
CO2: 22 mEq/L (ref 19–32)
Calcium: 6.9 mg/dL — ABNORMAL LOW (ref 8.4–10.5)
Chloride: 102 mEq/L (ref 96–112)
Creatinine, Ser: 1.02 mg/dL (ref 0.50–1.35)
GFR calc Af Amer: >90 mL/min (ref >90)
GFR calc non Af Amer: >90 mL/min (ref >90)
Glucose, Bld: 292 mg/dL — ABNORMAL HIGH (ref 70–99)
Potassium: 3.7 mEq/L (ref 3.7–5.3)
Sodium: 134 mEq/L — ABNORMAL LOW (ref 137–147)
Total Bilirubin: 0.5 mg/dL (ref 0.3–1.2)
Total Protein: 5.1 g/dL — ABNORMAL LOW (ref 6.0–8.3)

## 2013-03-12 LAB — GLUCOSE, CAPILLARY
Glucose-Capillary: 184 mg/dL — ABNORMAL HIGH (ref 70–99)
Glucose-Capillary: 200 mg/dL — ABNORMAL HIGH (ref 70–99)
Glucose-Capillary: 218 mg/dL — ABNORMAL HIGH (ref 70–99)
Glucose-Capillary: 244 mg/dL — ABNORMAL HIGH (ref 70–99)
Glucose-Capillary: 268 mg/dL — ABNORMAL HIGH (ref 70–99)

## 2013-03-12 LAB — ACETAMINOPHEN LEVEL: Acetaminophen (Tylenol), Serum: <15 ug/mL (ref 10–30)

## 2013-03-12 LAB — CLOSTRIDIUM DIFFICILE BY PCR: Toxigenic C. Difficile by PCR: NEGATIVE

## 2013-03-12 LAB — LIPASE, BLOOD: Lipase: 51 U/L (ref 11–59)

## 2013-03-12 MED ORDER — SODIUM CHLORIDE 0.9 % IV BOLUS (SEPSIS): 1000.0000 mL | Freq: Once | INTRAVENOUS | Status: DC

## 2013-03-12 MED ORDER — LEVOFLOXACIN IN D5W 750 MG/150ML IV SOLN
750.0000 mg | Freq: Once | INTRAVENOUS | Status: AC
  Administered 2013-03-12: 750 mg via INTRAVENOUS
  Filled 2013-03-12: qty 150

## 2013-03-12 MED ORDER — LEVOFLOXACIN IN D5W 750 MG/150ML IV SOLN
INTRAVENOUS | Status: AC
  Filled 2013-03-12: qty 150

## 2013-03-12 MED ORDER — METRONIDAZOLE IN NACL 5-0.79 MG/ML-% IV SOLN
500.0000 mg | Freq: Three times a day (TID) | INTRAVENOUS | Status: DC
  Administered 2013-03-12 – 2013-03-14 (×6): 500 mg via INTRAVENOUS
  Filled 2013-03-12 (×7): qty 100

## 2013-03-12 MED ORDER — LEVOFLOXACIN IN D5W 750 MG/150ML IV SOLN
750.0000 mg | INTRAVENOUS | Status: DC
  Administered 2013-03-12 – 2013-03-13 (×2): 750 mg via INTRAVENOUS
  Filled 2013-03-12 (×2): qty 150

## 2013-03-12 MED ORDER — SODIUM CHLORIDE 0.9 % IV SOLN
1000.0000 mL | INTRAVENOUS | Status: DC
  Administered 2013-03-12 – 2013-03-13 (×4): 1000 mL via INTRAVENOUS

## 2013-03-12 MED ORDER — GUAIFENESIN-DM 100-10 MG/5ML PO SYRP
5.0000 mL | ORAL_SOLUTION | ORAL | Status: DC | PRN
  Administered 2013-03-12 – 2013-03-15 (×9): 5 mL via ORAL
  Filled 2013-03-12 (×9): qty 5

## 2013-03-12 NOTE — Progress Notes (Signed)
WHEN PT UP TO BATHROOM HR INCREASED TO 150'S. THEN REMAINED IN THE 120'S.

## 2013-03-12 NOTE — Progress Notes (Signed)
ANTIBIOTIC CONSULT NOTE-Preliminary  Pharmacy Consult for Levofloxacin Indication: Bronchitis  Allergies  Allergen Reactions  . Amoxicillin Rash    Patient Measurements: Height: 5\' 7"  (170.2 cm) Weight: 165 lb (74.844 kg) IBW/kg (Calculated) : 66.1   Vital Signs: Temp: 99.4 F (37.4 C) (02/13 2318) Temp src: Oral (02/13 2318) BP: 72/33 mmHg (02/13 2318) Pulse Rate: 111 (02/13 2318)  Labs:  Recent Labs  03/11/13 1628 03/11/13 2303  WBC 8.2  --   HGB 16.1  --   PLT 213  --   CREATININE 1.20 1.12    Estimated Creatinine Clearance: 97.5 ml/min (by C-G formula based on Cr of 1.12).  No results found for this basename: VANCOTROUGH, VANCOPEAK, VANCORANDOM, GENTTROUGH, GENTPEAK, GENTRANDOM, TOBRATROUGH, TOBRAPEAK, TOBRARND, AMIKACINPEAK, AMIKACINTROU, AMIKACIN,  in the last 72 hours   Microbiology: No results found for this or any previous visit (from the past 720 hour(s)).  Medical History: Past Medical History  Diagnosis Date  . Diabetes mellitus     insulin pump    Medications:   Assessment: 23 yo insulin dependent diabetic male with productive cough, nausea/vomiting, dehydration, abdominal pain and elevated temperature. Chest x-ray normal, WBCs normal. Empiric Tx with IV Levaquin and Tamiflu.  Goal of Therapy:  Eradication of infection  Plan:  Preliminary review of pertinent patient information completed.  Protocol will be initiated with a one-time dose of levofloxacin 750 mg IV.  Jeani HawkingAnnie Penn clinical pharmacist will complete review during morning rounds to assess patient and finalize treatment regimen.  Arelia SneddonMason, Daneka Lantigua Anne, [facility] Medical CenterRPH 03/12/2013,12:31 AM

## 2013-03-12 NOTE — Progress Notes (Signed)
[facility] PROGRESS NOTE  PINCHOS TOPEL ONG:[id number] DOB: Nov 27, 1990 DOA: 03/11/2013 PCP: Ronnie Doss  Assessment/Plan: 1. SIRS. Patient was noted to be hypotensive, tachycardic and febrile on admission.  He has received aggressive hydration and blood pressure is now improving. Blood cultures have shown no growth. He is on antibiotics.   2. Hypotension.  Likely related to volume depletion.  Continue IV fluids.  Check cortisol 3. Vomiting and diarrhea.  CT abd was unremarkable.  Check GI pathogen panel. No recent antibiotic use, no sick contacts. Continue levofloxacin and flagyl.  Advance diet. 4. Diabetes type 1.  Continue lantus and sliding scale insulin 5. Elevated liver enzymes. Likely related to hypotension.  Will check RUQ ultrasound and hepatitis panel. Check tylenol level and urine drug screen.  Code Status: full code Family Communication: discussed with patient and parents at the bedside Disposition Plan: discharge home once improved   Consultants:    Procedures:    Antibiotics:  Levaquin 2/13  Flagyl 2/14  HPI/Subjective: Nausea and vomiting has improved, had one loose stool since admission, no abdominal pain  Objective: Filed Vitals:   03/12/13 0800  BP: 94/51  Pulse: 111  Temp:   Resp: 19    Intake/Output Summary (Last 24 hours) at 03/12/13 0859 Last data filed at 03/12/13 0800  Gross per 24 hour  Intake 3949.17 ml  Output      0 ml  Net 3949.17 ml   Filed Weights   03/11/13 1553 03/12/13 0007 03/12/13 0400  Weight: 74.844 kg (165 lb) 77 kg (169 lb 12.1 oz) 77.9 kg (171 lb 11.8 oz)    Exam:   General:  NAD  Cardiovascular: S1, S2 tachycardic  Respiratory: CTA B  Abdomen: soft, nt, nd, bs+  Musculoskeletal: no edema b/l   Data Reviewed: Basic Metabolic Panel:  Recent Labs Lab 03/11/13 1628 03/11/13 2303 03/12/13 0540  NA 139 134* 134*  K 3.4* 3.5* 3.7  CL 99 102 102  CO2 22 21 22   GLUCOSE 263* 277* 292*  BUN  20 19 20   CREATININE 1.20 1.12 1.02  CALCIUM 9.1 7.1* 6.9*   Liver Function Tests:  Recent Labs Lab 03/11/13 1628 03/12/13 0540  AST 64* 105*  ALT 44 63*  ALKPHOS 102 85  BILITOT 0.6 0.5  PROT 6.9 5.1*  ALBUMIN 3.4* 2.2*    Recent Labs Lab 03/11/13 1628  LIPASE 152*   No results found for this basename: AMMONIA,  in the last 168 hours CBC:  Recent Labs Lab 03/11/13 1628 03/12/13 0540  WBC 8.2 8.9  NEUTROABS 7.3  --   HGB 16.1 13.2  HCT 45.4 37.5*  MCV 89.0 88.9  PLT 213 157   Cardiac Enzymes: No results found for this basename: CKTOTAL, CKMB, CKMBINDEX, TROPONINI,  in the last 168 hours BNP (last 3 results) No results found for this basename: PROBNP,  in the last 8760 hours CBG:  Recent Labs Lab 03/11/13 1601 03/12/13 0022 03/12/13 0739  GLUCAP 221* 244* 268*    Recent Results (from the past 240 hour(s))  CULTURE, BLOOD (ROUTINE X 2)     Status: None   Collection Time    03/11/13  9:28 PM      Result Value Ref Range Status   Specimen Description BLOOD RIGHT ANTECUBITAL   Final   Special Requests BOTTLES DRAWN AEROBIC AND ANAEROBIC 10CC   Final   Culture NO GROWTH 1 DAY   Final   Report Status PENDING   Incomplete  CULTURE, BLOOD (  ROUTINE X 2)     Status: None   Collection Time    03/11/13  9:30 PM      Result Value Ref Range Status   Specimen Description BLOOD LEFT HAND   Final   Special Requests BOTTLES DRAWN AEROBIC AND ANAEROBIC 8CC   Final   Culture NO GROWTH 1 DAY   Final   Report Status PENDING   Incomplete  MRSA PCR SCREENING     Status: None   Collection Time    03/12/13  2:00 AM      Result Value Ref Range Status   MRSA by PCR NEGATIVE  NEGATIVE Final   Comment:            The GeneXpert MRSA Assay (FDA     approved for NASAL specimens     only), is one component of a     comprehensive MRSA colonization     surveillance program. It is not     intended to diagnose MRSA     infection nor to guide or     monitor treatment for      MRSA infections.     Studies: Dg Chest 2 View  03/11/2013   CLINICAL DATA:  Stomach ache, vomiting, cough  EXAM: CHEST  2 VIEW  COMPARISON:  DG CHEST 1 VIEW dated 11/01/2012; DG CHEST 2 VIEW dated 06/28/2011  FINDINGS: Unchanged cardiac silhouette and mediastinal contours. No focal airspace opacities. No pleural effusion or pneumothorax. No evidence of edema. Unchanged bones.  IMPRESSION: No acute cardiopulmonary disease.   Electronically Signed   By: Simonne ComeJohn  Watts M.D.   On: 03/11/2013 21:40   Ct Abdomen Pelvis W Contrast  03/11/2013   CLINICAL DATA:  Abdominal pain.  EXAM: CT ABDOMEN AND PELVIS WITH CONTRAST  TECHNIQUE: Multidetector CT imaging of the abdomen and pelvis was performed using the standard protocol following bolus administration of intravenous contrast.  CONTRAST:  50mL OMNIPAQUE IOHEXOL 300 MG/ML SOLN, 100mL OMNIPAQUE IOHEXOL 300 MG/ML SOLN  COMPARISON:  CT scan of April 09, 2010.  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 Visualized lung bases appear normal.  The liver, spleen and pancreas appear normal. No gallstones are noted. Adrenal glands appear normal. No hydronephrosis or renal obstruction is noted. Small nonobstructive calculus is seen in left kidney. No ureteral calculi are noted. The appendix appears normal. There is no evidence of bowel obstruction. Upper abdominal and mesenteric adenopathy is again noted and unchanged compared to prior exam. Bilateral inguinal adenopathy is also noted and unchanged. There is continued uniform wall thickening of the urinary bladder whichwas present on prior exam. No abnormal fluid collection is noted.  IMPRESSION: Small nonobstructive left renal calculus. No hydronephrosis or renal obstruction is seen.  Stable adenopathy is noted in the retroperitoneal, mesenteric and bilateral inguinal regions which is unchanged compared to prior exam of 2012, and therefore most likely is inflammatory or reactive in nature.  Uniform wall thickening  of urinary bladder is noted which was present on prior exam ; this is concerning for chronic cystitis or inflammation.   Electronically Signed   By: Roque LiasJames  Green M.D.   On: 03/11/2013 19:07    Scheduled Meds: . enoxaparin (LOVENOX) injection  40 mg Subcutaneous Q24H  . insulin aspart  0-5 Units Subcutaneous QHS  . insulin aspart  0-9 Units Subcutaneous TID WC  . insulin glargine  34 Units Subcutaneous QHS  . levofloxacin (LEVAQUIN) IV  750 mg Intravenous Q24H  . metronidazole  500 mg Intravenous Q8H   Continuous Infusions: . sodium chloride 1,000 mL (03/12/13 0800)    Active Problems:   Acute bronchitis   Dehydration   DM (diabetes mellitus)   Nausea with vomiting   Diarrhea   Elevated liver enzymes   SIRS (systemic inflammatory response syndrome)   Hypotension, unspecified    Time spent:    MEMON,JEHANZEB  [facility] Pager [phone]. If 7PM-7AM, please contact night-coverage at [email or web address], password [facility] 03/12/2013, 8:59 AM  LOS: 1 day

## 2013-03-12 NOTE — Progress Notes (Signed)
ANTIBIOTIC CONSULT NOTE  Pharmacy Consult for Levaquin Indication: acute bronchitis  Allergies  Allergen Reactions  . Amoxicillin Rash    Patient Measurements: Height: 5\' 7"  (170.2 cm) Weight: 171 lb 11.8 oz (77.9 kg) IBW/kg (Calculated) : 66.1  Vital Signs: Temp: 98.8 F (37.1 C) (02/14 0400) Temp src: Oral (02/14 0400) BP: 94/51 mmHg (02/14 0800) Pulse Rate: 111 (02/14 0800) Intake/Output from previous day: 02/13 0701 - 02/14 0700 In: 3824.2 [P.O.:720; I.V.:2954.2; IV Piggyback:150] Out: -  Intake/Output from this shift: Total I/O In: 125 [I.V.:125] Out: -   Labs:  Recent Labs  03/11/13 1628 03/11/13 2303 03/12/13 0540  WBC 8.2  --  8.9  HGB 16.1  --  13.2  PLT 213  --  157  CREATININE 1.20 1.12 1.02   Estimated Creatinine Clearance: 107.1 ml/min (by C-G formula based on Cr of 1.02). No results found for this basename: VANCOTROUGH, Leodis BinetVANCOPEAK, VANCORANDOM, GENTTROUGH, GENTPEAK, GENTRANDOM, TOBRATROUGH, TOBRAPEAK, TOBRARND, AMIKACINPEAK, AMIKACINTROU, AMIKACIN,  in the last 72 hours   Microbiology: Recent Results (from the past 720 hour(s))  CULTURE, BLOOD (ROUTINE X 2)     Status: None   Collection Time    03/11/13  9:28 PM      Result Value Ref Range Status   Specimen Description BLOOD RIGHT ANTECUBITAL   Final   Special Requests BOTTLES DRAWN AEROBIC AND ANAEROBIC 10CC   Final   Culture NO GROWTH 1 DAY   Final   Report Status PENDING   Incomplete  CULTURE, BLOOD (ROUTINE X 2)     Status: None   Collection Time    03/11/13  9:30 PM      Result Value Ref Range Status   Specimen Description BLOOD LEFT HAND   Final   Special Requests BOTTLES DRAWN AEROBIC AND ANAEROBIC 8CC   Final   Culture NO GROWTH 1 DAY   Final   Report Status PENDING   Incomplete  MRSA PCR SCREENING     Status: None   Collection Time    03/12/13  2:00 AM      Result Value Ref Range Status   MRSA by PCR NEGATIVE  NEGATIVE Final   Comment:            The GeneXpert MRSA Assay (FDA      approved for NASAL specimens     only), is one component of a     comprehensive MRSA colonization     surveillance program. It is not     intended to diagnose MRSA     infection nor to guide or     monitor treatment for     MRSA infections.    Medical History: Past Medical History  Diagnosis Date  . Diabetes mellitus     insulin pump    Medications:  Prescriptions prior to admission  Medication Sig Dispense Refill  . insulin glargine (LANTUS) 100 UNIT/ML injection Inject 34 Units into the skin at bedtime.      . insulin glulisine (APIDRA) 100 UNIT/ML injection Inject 10-16 Units into the skin 3 (three) times daily before meals. According to sliding scale at home       Assessment: Okay for Protocol protocol.  Received initial dose last evening.  Levaquin 2/14 Oseltamivir 2/13  Goal of Therapy:  Eradicate infection.  Plan:  Levaquin 750mg  IV every 24 hours. Follow up culture results  Mady GemmaHayes, Briella Hobday R 03/12/2013,8:32 AM

## 2013-03-13 DIAGNOSIS — E119 Type 2 diabetes mellitus without complications: Secondary | ICD-10-CM

## 2013-03-13 DIAGNOSIS — R748 Abnormal levels of other serum enzymes: Secondary | ICD-10-CM

## 2013-03-13 LAB — COMPREHENSIVE METABOLIC PANEL
ALT: 55 U/L — ABNORMAL HIGH (ref 0–53)
AST: 55 U/L — ABNORMAL HIGH (ref 0–37)
Albumin: 2.1 g/dL — ABNORMAL LOW (ref 3.5–5.2)
Alkaline Phosphatase: 123 U/L — ABNORMAL HIGH (ref 39–117)
BUN: 10 mg/dL (ref 6–23)
CO2: 20 mEq/L (ref 19–32)
Calcium: 7.1 mg/dL — ABNORMAL LOW (ref 8.4–10.5)
Chloride: 103 mEq/L (ref 96–112)
Creatinine, Ser: 0.93 mg/dL (ref 0.50–1.35)
GFR calc Af Amer: >90 mL/min (ref >90)
GFR calc non Af Amer: >90 mL/min (ref >90)
Glucose, Bld: 197 mg/dL — ABNORMAL HIGH (ref 70–99)
Potassium: 3.1 mEq/L — ABNORMAL LOW (ref 3.7–5.3)
Sodium: 136 mEq/L — ABNORMAL LOW (ref 137–147)
Total Bilirubin: 0.5 mg/dL (ref 0.3–1.2)
Total Protein: 5.3 g/dL — ABNORMAL LOW (ref 6.0–8.3)

## 2013-03-13 LAB — GLUCOSE, CAPILLARY
Glucose-Capillary: 181 mg/dL — ABNORMAL HIGH (ref 70–99)
Glucose-Capillary: 209 mg/dL — ABNORMAL HIGH (ref 70–99)
Glucose-Capillary: 217 mg/dL — ABNORMAL HIGH (ref 70–99)
Glucose-Capillary: 237 mg/dL — ABNORMAL HIGH (ref 70–99)

## 2013-03-13 LAB — CBC
HCT: 38.5 % — ABNORMAL LOW (ref 39.0–52.0)
Hemoglobin: 13.8 g/dL (ref 13.0–17.0)
MCH: 31.9 pg (ref 26.0–34.0)
MCHC: 35.8 g/dL (ref 30.0–36.0)
MCV: 88.9 fL (ref 78.0–100.0)
Platelets: 149 10*3/uL — ABNORMAL LOW (ref 150–400)
RBC: 4.33 MIL/uL (ref 4.22–5.81)
RDW: 13.5 % (ref 11.5–15.5)
WBC: 8.2 10*3/uL (ref 4.0–10.5)

## 2013-03-13 LAB — MAGNESIUM: Magnesium: 1 mg/dL — ABNORMAL LOW (ref 1.5–2.5)

## 2013-03-13 MED ORDER — MAGNESIUM SULFATE 40 MG/ML IJ SOLN
2.0000 g | Freq: Once | INTRAMUSCULAR | Status: AC
  Administered 2013-03-13: 2 g via INTRAVENOUS
  Filled 2013-03-13 (×2): qty 50

## 2013-03-13 MED ORDER — SODIUM CHLORIDE 0.9 % IV SOLN: 1000.0000 mL | INTRAVENOUS | Status: DC

## 2013-03-13 MED ORDER — POTASSIUM CHLORIDE CRYS ER 20 MEQ PO TBCR
40.0000 meq | EXTENDED_RELEASE_TABLET | ORAL | Status: AC
  Administered 2013-03-13 (×3): 40 meq via ORAL
  Filled 2013-03-13 (×3): qty 2

## 2013-03-13 NOTE — Progress Notes (Signed)
[facility] PROGRESS NOTE  Gerald DewRyan C Perry ZOX:[id number] DOB: 1990-12-12 DOA: 03/11/2013 PCP: Ronnie DossWILLARD,JENNIFER, [name]  Assessment/Plan: 1. SIRS. Hemodynamic status improved with IV fluids, although he still mildly hypotensive and tachycardic. Blood cultures show no growth. He is continued on antibiotics. He was still febrile overnight. Continue current treatments. Suspect a viral gastroenteritis. 2. Hypotension.  Likely related to volume depletion.  Continue IV fluids.  Cortisol level was normal. Improved with IV fluids. 3. Vomiting and diarrhea.  CT abd was unremarkable.  CT of PCR was negative. GI pathogen panel pending.. No recent antibiotic use, no sick contacts. Continue levofloxacin and flagyl.  Advance diet. Likely related to a viral gastroenteritis. 4. Diabetes type 1.  Continue lantus and sliding scale insulin 5. Elevated liver enzymes. Likely related to hypotension.  Right upper quadrant ultrasound unremarkable. Hepatitis panel, Tylenol and urine drug screen were negative. Appears to be improving with IV fluids.  Code Status: full code Family Communication: discussed with patient and parents at the bedside Disposition Plan: discharge home once improved, possibly tomorrow   Consultants:    Procedures:    Antibiotics:  Levaquin 2/13  Flagyl 2/14  HPI/Subjective: Patient is feeling better today. Last bowel movement was yesterday. No vomiting. No abdominal pain. He does have a mild nonproductive cough.  Objective: Filed Vitals:   03/13/13 0800  BP: 98/57  Pulse: 92  Temp:   Resp: 15    Intake/Output Summary (Last 24 hours) at 03/13/13 0917 Last data filed at 03/13/13 0800  Gross per 24 hour  Intake   5810 ml  Output   1350 ml  Net   4460 ml   Filed Weights   03/12/13 0007 03/12/13 0400 03/13/13 0500  Weight: 77 kg (169 lb 12.1 oz) 77.9 kg (171 lb 11.8 oz) 81.9 kg (180 lb 8.9 oz)    Exam:   General:  NAD  Cardiovascular: S1, S2  tachycardic  Respiratory: CTA B  Abdomen: soft, nt, nd, bs+  Musculoskeletal: no edema b/l   Data Reviewed: Basic Metabolic Panel:  Recent Labs Lab 03/11/13 1628 03/11/13 2303 03/12/13 0540 03/13/13 0450  NA 139 134* 134* 136*  K 3.4* 3.5* 3.7 3.1*  CL 99 102 102 103  CO2 22 21 22 20   GLUCOSE 263* 277* 292* 197*  BUN 20 19 20 10   CREATININE 1.20 1.12 1.02 0.93  CALCIUM 9.1 7.1* 6.9* 7.1*   Liver Function Tests:  Recent Labs Lab 03/11/13 1628 03/12/13 0540 03/13/13 0450  AST 64* 105* 55*  ALT 44 63* 55*  ALKPHOS 102 85 123*  BILITOT 0.6 0.5 0.5  PROT 6.9 5.1* 5.3*  ALBUMIN 3.4* 2.2* 2.1*    Recent Labs Lab 03/11/13 1628 03/12/13 0540  LIPASE 152* 51   No results found for this basename: AMMONIA,  in the last 168 hours CBC:  Recent Labs Lab 03/11/13 1628 03/12/13 0540 03/13/13 0450  WBC 8.2 8.9 8.2  NEUTROABS 7.3  --   --   HGB 16.1 13.2 13.8  HCT 45.4 37.5* 38.5*  MCV 89.0 88.9 88.9  PLT 213 157 149*   Cardiac Enzymes: No results found for this basename: CKTOTAL, CKMB, CKMBINDEX, TROPONINI,  in the last 168 hours BNP (last 3 results) No results found for this basename: PROBNP,  in the last 8760 hours CBG:  Recent Labs Lab 03/12/13 0739 03/12/13 1139 03/12/13 1629 03/12/13 2057 03/13/13 0722  GLUCAP 268* 184* 218* 200* 181*    Recent Results (from the past 240 hour(s))  CULTURE, BLOOD (  ROUTINE X 2)     Status: None   Collection Time    03/11/13  9:28 PM      Result Value Ref Range Status   Specimen Description BLOOD RIGHT ANTECUBITAL   Final   Special Requests BOTTLES DRAWN AEROBIC AND ANAEROBIC 10CC   Final   Culture NO GROWTH 2 DAYS   Final   Report Status PENDING   Incomplete  CULTURE, BLOOD (ROUTINE X 2)     Status: None   Collection Time    03/11/13  9:30 PM      Result Value Ref Range Status   Specimen Description BLOOD LEFT HAND   Final   Special Requests BOTTLES DRAWN AEROBIC AND ANAEROBIC 8CC   Final   Culture NO  GROWTH 2 DAYS   Final   Report Status PENDING   Incomplete  MRSA PCR SCREENING     Status: None   Collection Time    03/12/13  2:00 AM      Result Value Ref Range Status   MRSA by PCR NEGATIVE  NEGATIVE Final   Comment:            The GeneXpert MRSA Assay (FDA     approved for NASAL specimens     only), is one component of a     comprehensive MRSA colonization     surveillance program. It is not     intended to diagnose MRSA     infection nor to guide or     monitor treatment for     MRSA infections.  CLOSTRIDIUM DIFFICILE BY PCR     Status: None   Collection Time    03/12/13 11:24 AM      Result Value Ref Range Status   C difficile by pcr NEGATIVE  NEGATIVE Final     Studies: Dg Chest 2 View  03/11/2013   CLINICAL DATA:  Stomach ache, vomiting, cough  EXAM: CHEST  2 VIEW  COMPARISON:  DG CHEST 1 VIEW dated 11/01/2012; DG CHEST 2 VIEW dated 06/28/2011  FINDINGS: Unchanged cardiac silhouette and mediastinal contours. No focal airspace opacities. No pleural effusion or pneumothorax. No evidence of edema. Unchanged bones.  IMPRESSION: No acute cardiopulmonary disease.   Electronically Signed   By: Simonne Come M.D.   On: 03/11/2013 21:40   Ct Abdomen Pelvis W Contrast  03/11/2013   CLINICAL DATA:  Abdominal pain.  EXAM: CT ABDOMEN AND PELVIS WITH CONTRAST  TECHNIQUE: Multidetector CT imaging of the abdomen and pelvis was performed using the standard protocol following bolus administration of intravenous contrast.  CONTRAST:  50mL OMNIPAQUE IOHEXOL 300 MG/ML SOLN, OMNIPAQUE IOHEXOL 300 MG/ML SOLN  COMPARISON:  CT scan of April 09, 2010.  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 Visualized lung bases appear normal.  The liver, spleen and pancreas appear normal. No gallstones are noted. Adrenal glands appear normal. No hydronephrosis or renal obstruction is noted. Small nonobstructive calculus is seen in left kidney. No ureteral calculi are noted. The appendix appears  normal. There is no evidence of bowel obstruction. Upper abdominal and mesenteric adenopathy is again noted and unchanged compared to prior exam. Bilateral inguinal adenopathy is also noted and unchanged. There is continued uniform wall thickening of the urinary bladder whichwas present on prior exam. No abnormal fluid collection is noted.  IMPRESSION: Small nonobstructive left renal calculus. No hydronephrosis or renal obstruction is seen.  Stable adenopathy is noted in the retroperitoneal, mesenteric and bilateral inguinal  regions which is unchanged compared to prior exam of 2012, and therefore most likely is inflammatory or reactive in nature.  Uniform wall thickening of urinary bladder is noted which was present on prior exam ; this is concerning for chronic cystitis or inflammation.   Electronically Signed   By: Roque Lias M.D.   On: 03/11/2013 19:07   US Abdomen Limited Ruq  03/12/2013   CLINICAL DATA:  Elevated LFTs  EXAM: US ABDOMEN LIMITED - RIGHT UPPER QUADRANT  COMPARISON:  CT ABD - PELV W/ CM dated 03/11/2013; CT ABD/PELVIS W CM dated 04/09/2010  FINDINGS: Gallbladder:  There is a potential minimal amount of layering mixed echogenic sludge (image 46) within otherwise normal-appearing gallbladder. No gallbladder wall thickening or pericholecystic fluid. Negative sonographic Murphy's sign.  Common bile duct:  Diameter: Normal in size measuring 3.2 mm in diameter  Liver:  Homogeneous hepatic echotexture. No discrete hepatic lesions. No definite evidence of intrahepatic biliary ductal dilatation. No ascites.  IMPRESSION: Suspected minimal amount of layering echogenic sludge within otherwise normal-appearing gallbladder. Otherwise, unremarkable right upper quadrant abdominal ultrasound.   Electronically Signed   By: Simonne Come M.D.   On: 03/12/2013 12:25    Scheduled Meds: . enoxaparin (LOVENOX) injection  40 mg Subcutaneous Q24H  . insulin aspart  0-5 Units Subcutaneous QHS  . insulin aspart  0-9  Units Subcutaneous TID WC  . insulin glargine  34 Units Subcutaneous QHS  . levofloxacin (LEVAQUIN) IV  750 mg Intravenous Q24H  . metronidazole  500 mg Intravenous Q8H  . sodium chloride  1,000 mL Intravenous Once   Continuous Infusions: . sodium chloride 1,000 mL (03/13/13 0800)    Active Problems:   Acute bronchitis   Dehydration   DM (diabetes mellitus)   Nausea with vomiting   Diarrhea   Elevated liver enzymes   SIRS (systemic inflammatory response syndrome)   Hypotension, unspecified    Time spent:    MEMON,JEHANZEB  [facility] Pager [phone]. If 7PM-7AM, please contact night-coverage at [email or web address], password [facility] 03/13/2013, 9:17 AM  LOS: 2 days

## 2013-03-13 NOTE — Progress Notes (Signed)
Patient asked if he could shower.  Dr. Kerry HoughMemon paged.  Returned page and stated that patient may shower if not feeling dizzy and feels that he is able.  Someone will need to be in room with patient.

## 2013-03-13 NOTE — Progress Notes (Signed)
PT COMPLAINING OF RASH STARTING AT RIGHT HAND AND GOING UP ARM.

## 2013-03-13 NOTE — Progress Notes (Signed)
PT TRANSFERRING TO ROOM 325 ON TELEMETRY. HR 116. O2 SAT 98% ON ROOM AIR. VSS. RT AC IV INFUSING W/O DIFFICULTY.SKIN WARM AND DRY. MOTHER W/ PT AT BEDSIDE. TRANSFER REPORT GIVEN TO MARY BETH HAWKINS RN ON 300.

## 2013-03-14 ENCOUNTER — Inpatient Hospital Stay (HOSPITAL_COMMUNITY): Payer: 59

## 2013-03-14 DIAGNOSIS — R21 Rash and other nonspecific skin eruption: Secondary | ICD-10-CM | POA: Diagnosis not present

## 2013-03-14 LAB — GI PATHOGEN PANEL BY PCR, STOOL
C difficile toxin A/B: NEGATIVE
Campylobacter by PCR: NEGATIVE
Cryptosporidium by PCR: NEGATIVE
E coli (ETEC) LT/ST: NEGATIVE
E coli (STEC): NEGATIVE
E coli 0157 by PCR: NEGATIVE
G lamblia by PCR: NEGATIVE
Norovirus GI/GII: NEGATIVE
Rotavirus A by PCR: NEGATIVE
Salmonella by PCR: NEGATIVE
Shigella by PCR: NEGATIVE

## 2013-03-14 LAB — CBC
HCT: 37.8 % — ABNORMAL LOW (ref 39.0–52.0)
Hemoglobin: 13.1 g/dL (ref 13.0–17.0)
MCH: 30.7 pg (ref 26.0–34.0)
MCHC: 34.7 g/dL (ref 30.0–36.0)
MCV: 88.5 fL (ref 78.0–100.0)
Platelets: 138 10*3/uL — ABNORMAL LOW (ref 150–400)
RBC: 4.27 MIL/uL (ref 4.22–5.81)
RDW: 13.5 % (ref 11.5–15.5)
WBC: 9.9 10*3/uL (ref 4.0–10.5)

## 2013-03-14 LAB — COMPREHENSIVE METABOLIC PANEL
ALT: 38 U/L (ref 0–53)
AST: 20 U/L (ref 0–37)
Albumin: 2.1 g/dL — ABNORMAL LOW (ref 3.5–5.2)
Alkaline Phosphatase: 120 U/L — ABNORMAL HIGH (ref 39–117)
BUN: 5 mg/dL — ABNORMAL LOW (ref 6–23)
CO2: 23 mEq/L (ref 19–32)
Calcium: 7.8 mg/dL — ABNORMAL LOW (ref 8.4–10.5)
Chloride: 103 mEq/L (ref 96–112)
Creatinine, Ser: 0.78 mg/dL (ref 0.50–1.35)
GFR calc Af Amer: >90 mL/min (ref >90)
GFR calc non Af Amer: >90 mL/min (ref >90)
Glucose, Bld: 246 mg/dL — ABNORMAL HIGH (ref 70–99)
Potassium: 3.3 mEq/L — ABNORMAL LOW (ref 3.7–5.3)
Sodium: 139 mEq/L (ref 137–147)
Total Bilirubin: 0.5 mg/dL (ref 0.3–1.2)
Total Protein: 5.3 g/dL — ABNORMAL LOW (ref 6.0–8.3)

## 2013-03-14 LAB — GLUCOSE, CAPILLARY
Glucose-Capillary: 192 mg/dL — ABNORMAL HIGH (ref 70–99)
Glucose-Capillary: 233 mg/dL — ABNORMAL HIGH (ref 70–99)
Glucose-Capillary: 239 mg/dL — ABNORMAL HIGH (ref 70–99)
Glucose-Capillary: 286 mg/dL — ABNORMAL HIGH (ref 70–99)

## 2013-03-14 LAB — MAGNESIUM: Magnesium: 1.7 mg/dL (ref 1.5–2.5)

## 2013-03-14 LAB — HIV ANTIBODY (ROUTINE TESTING W REFLEX): HIV: NONREACTIVE

## 2013-03-14 MED ORDER — SODIUM CHLORIDE 0.9 % IV SOLN: 1000.0000 mL | INTRAVENOUS | Status: DC

## 2013-03-14 MED ORDER — GLUCERNA SHAKE PO LIQD
237.0000 mL | Freq: Three times a day (TID) | ORAL | Status: DC
  Administered 2013-03-14 – 2013-03-15 (×2): 237 mL via ORAL

## 2013-03-14 MED ORDER — ZOLPIDEM TARTRATE 5 MG PO TABS
5.0000 mg | ORAL_TABLET | Freq: Every evening | ORAL | Status: DC | PRN
  Administered 2013-03-14: 5 mg via ORAL
  Filled 2013-03-14: qty 1

## 2013-03-14 MED ORDER — INSULIN ASPART 100 UNIT/ML ~~LOC~~ SOLN
3.0000 [IU] | Freq: Three times a day (TID) | SUBCUTANEOUS | Status: DC
  Administered 2013-03-15: 3 [IU] via SUBCUTANEOUS

## 2013-03-14 MED ORDER — HYDROCOD POLST-CHLORPHEN POLST 10-8 MG/5ML PO LQCR
5.0000 mL | Freq: Two times a day (BID) | ORAL | Status: DC | PRN
  Administered 2013-03-14: 5 mL via ORAL
  Filled 2013-03-14: qty 5

## 2013-03-14 MED ORDER — DIPHENHYDRAMINE HCL 25 MG PO CAPS
25.0000 mg | ORAL_CAPSULE | Freq: Four times a day (QID) | ORAL | Status: DC | PRN
  Administered 2013-03-14: 25 mg via ORAL
  Filled 2013-03-14: qty 1

## 2013-03-14 NOTE — Progress Notes (Signed)
[facility] PROGRESS NOTE  [name] UXL:[id number] DOB: 1990-11-12 DOA: 03/11/2013 PCP: Ronnie Doss  Assessment/Plan: 1. SIRS. Hemodynamic status improved with IV fluids, although he still mildly hypotensive and tachycardic. Blood cultures show no growth. He is continued on antibiotics. He was still febrile overnight. Suspect a viral syndrome.  Continue supportive care. Check chest xray. 2. Macular Rash.  Possibly related to viral syndrome vs. Drug rash.  levaquin was discontinued yesterday.  Will also discontinue flagyl since he does not appear to be having any diarrhea.  Continue to follow clinically.  Cultures have shown no growth 3. Hypotension.  Likely related to volume depletion.  Continue IV fluids.  Cortisol level was normal. Improved with IV fluids. 4. Vomiting and diarrhea.  CT abd was unremarkable.  C diff PCR was negative. GI pathogen panel pending.. No recent antibiotic use, no sick contacts. Likely related to a viral gastroenteritis. He no longer appears to be having any GI complaints.  Will discontinue antibiotics. 5. Diabetes type 1.  Continue lantus and sliding scale insulin 6. Elevated liver enzymes. Likely related to hypotension.  Right upper quadrant ultrasound unremarkable. Hepatitis panel, Tylenol and urine drug screen were negative. Resolved with IV fluids.  Code Status: full code Family Communication: discussed with patient and parents at the bedside Disposition Plan: discharge home once improved, possibly tomorrow   Consultants:    Procedures:    Antibiotics:  Levaquin 2/13>> 2/16  Flagyl 2/14>> 2/16  HPI/Subjective: Did not sleep well overnight. Has a nonproductive cough. No vomiting or diarrhea. No abdominal pain. Felt that his rash may have started to itch overnight, but that has resolved.  Objective: Filed Vitals:   03/14/13 1445  BP: 111/74  Pulse: 100  Temp: 99.4 F (37.4 C)  Resp: 20    Intake/Output Summary (Last 24  hours) at 03/14/13 1953 Last data filed at 03/14/13 1700  Gross per 24 hour  Intake   1920 ml  Output   1600 ml  Net    320 ml   Filed Weights   03/12/13 0007 03/12/13 0400 03/13/13 0500  Weight: 77 kg (169 lb 12.1 oz) 77.9 kg (171 lb 11.8 oz) 81.9 kg (180 lb 8.9 oz)    Exam:   General:  NAD  Cardiovascular: S1, S2 tachycardic  Respiratory: CTA B  Abdomen: soft, nt, nd, bs+  Musculoskeletal: 1+ pitting edema noted bilaterally  Skin: patchy macular rash on upper and lower extremities bilaterally as well as trunk, spread from yesterday  Data Reviewed: Basic Metabolic Panel:  Recent Labs Lab 03/11/13 1628 03/11/13 2303 03/12/13 0540 03/13/13 0450 03/14/13 0517  NA 139 134* 134* 136* 139  K 3.4* 3.5* 3.7 3.1* 3.3*  CL 99 102 102 103 103  CO2 22 21 22 20 23   GLUCOSE 263* 277* 292* 197* 246*  BUN 20 19 20 10  5*  CREATININE 1.20 1.12 1.02 0.93 0.78  CALCIUM 9.1 7.1* 6.9* 7.1* 7.8*  MG  --   --   --  1.0* 1.7   Liver Function Tests:  Recent Labs Lab 03/11/13 1628 03/12/13 0540 03/13/13 0450 03/14/13 0517  AST 64* 105* 55* 20  ALT 44 63* 55* 38  ALKPHOS 102 85 123* 120*  BILITOT 0.6 0.5 0.5 0.5  PROT 6.9 5.1* 5.3* 5.3*  ALBUMIN 3.4* 2.2* 2.1* 2.1*    Recent Labs Lab 03/11/13 1628 03/12/13 0540  LIPASE 152* 51   No results found for this basename: AMMONIA,  in the last 168 hours CBC:  Recent Labs Lab 03/11/13 1628 03/12/13 0540 03/13/13 0450 03/14/13 0517  WBC 8.2 8.9 8.2 9.9  NEUTROABS 7.3  --   --   --   HGB 16.1 13.2 13.8 13.1  HCT 45.4 37.5* 38.5* 37.8*  MCV 89.0 88.9 88.9 88.5  PLT 213 157 149* 138*   Cardiac Enzymes: No results found for this basename: CKTOTAL, CKMB, CKMBINDEX, TROPONINI,  in the last 168 hours BNP (last 3 results) No results found for this basename: PROBNP,  in the last 8760 hours CBG:  Recent Labs Lab 03/13/13 1650 03/13/13 2218 03/14/13 0756 03/14/13 1153 03/14/13 1618  GLUCAP 217* 237* 192* 233* 239*     Recent Results (from the past 240 hour(s))  CULTURE, BLOOD (ROUTINE X 2)     Status: None   Collection Time    03/11/13  9:28 PM      Result Value Ref Range Status   Specimen Description BLOOD RIGHT ANTECUBITAL   Final   Special Requests BOTTLES DRAWN AEROBIC AND ANAEROBIC 10CC   Final   Culture NO GROWTH 3 DAYS   Final   Report Status PENDING   Incomplete  CULTURE, BLOOD (ROUTINE X 2)     Status: None   Collection Time    03/11/13  9:30 PM      Result Value Ref Range Status   Specimen Description BLOOD LEFT HAND   Final   Special Requests BOTTLES DRAWN AEROBIC AND ANAEROBIC 8CC   Final   Culture NO GROWTH 3 DAYS   Final   Report Status PENDING   Incomplete  MRSA PCR SCREENING     Status: None   Collection Time    03/12/13  2:00 AM      Result Value Ref Range Status   MRSA by PCR NEGATIVE  NEGATIVE Final   Comment:            The GeneXpert MRSA Assay (FDA     approved for NASAL specimens     only), is one component of a     comprehensive MRSA colonization     surveillance program. It is not     intended to diagnose MRSA     infection nor to guide or     monitor treatment for     MRSA infections.  CLOSTRIDIUM DIFFICILE BY PCR     Status: None   Collection Time    03/12/13 11:24 AM      Result Value Ref Range Status   C difficile by pcr NEGATIVE  NEGATIVE Final     Studies: Dg Chest 2 View  03/14/2013   CLINICAL DATA:  Fever question pneumonia  EXAM: CHEST  2 VIEW  COMPARISON:  03/11/2013  FINDINGS: Decreased lung volumes.  Upper normal heart size.  Mediastinal contours and pulmonary vascularity normal.  Bibasilar opacities favor left lower lobe infiltrate and atelectasis versus infiltrate at right base as well.  Upper lungs clear.  Probable small bilateral pleural effusions.  Bones unremarkable.  IMPRESSION: Small bilateral pleural effusions suspected.  Bibasilar opacities favoring left lower lobe pneumonia and atelectasis versus infiltrate at right base.    Electronically Signed   By: Ulyses Southward M.D.   On: 03/14/2013 12:50    Scheduled Meds: . enoxaparin (LOVENOX) injection  40 mg Subcutaneous Q24H  . insulin aspart  0-5 Units Subcutaneous QHS  . insulin aspart  0-9 Units Subcutaneous TID WC  . insulin glargine  34 Units Subcutaneous QHS  . sodium chloride  1,000 mL Intravenous  Once   Continuous Infusions: . sodium chloride 1,000 mL (03/14/13 1217)    Active Problems:   Acute bronchitis   Dehydration   DM (diabetes mellitus)   Nausea with vomiting   Diarrhea   Elevated liver enzymes   SIRS (systemic inflammatory response syndrome)   Hypotension, unspecified   Rash and nonspecific skin eruption    Time spent: 30mins    Dionta Larke  [facility] Pager [phone]. If 7PM-7AM, please contact night-coverage at [email or web address], password [email or web address] 03/14/2013, 7:53 PM  LOS: 3 days

## 2013-03-14 NOTE — Progress Notes (Signed)
Skin continues to feel warm to touch but oral temp remains 99.0. Rectal temp obtained, 99.3. States he feels like he is burning up. Rash starting to itch slightly on his legs.

## 2013-03-14 NOTE — Progress Notes (Signed)
Inpatient Diabetes Program Recommendations  AACE/ADA: New Consensus Statement on Inpatient Glycemic Control (2013)  Target Ranges:  Prepandial:   less than 140 mg/dL      Peak postprandial:   less than 180 mg/dL (1-2 hours)      Critically ill patients:  140 - 180 mg/dL   Reason for Assessment: Hyperglycemia  Diabetes history: type 1 diabetes Outpatient Diabetes medications: Lantus 34 units, Apidra 10 to 16 units tid   Current orders for Inpatient glycemic control: Lantus 34 units at HS and Novolog correction scale tid  Note:  Since patient has type 1 diabetes, request MD order some Novolog meal coverage, 3 units tid with meals with upward titration as indicated, in addition to correction scale.  (To be given if patient eats at least 50% of meal and CBG is at least 80 mg/dl).  Thank you.  Cheyna Retana S. Elsie Lincolnouth, RN, CNS, CDE Inpatient Diabetes Program, team pager [phone]

## 2013-03-14 NOTE — Progress Notes (Signed)
Patients rash is worse after Levaquin administration tonight. States its now itching. Paged Dr Sharl MaLama for orders. Discontinued Levaquin and ordered benadryl for itching rash.

## 2013-03-14 NOTE — Care Management Note (Signed)
    Page 1 of 1   03/14/2013     1:31:43 PM   CARE MANAGEMENT NOTE 03/14/2013  Patient:  [name],[name]   Account Number:  [id number]  Date Initiated:  03/14/2013  Documentation initiated by:  Rosemary HolmsOBSON,Sheleen Conchas  Subjective/Objective Assessment:   Pt admitted from home whre he lives with his parent. No needs identified.     Action/Plan:   Anticipated DC Date:     Anticipated DC Plan:           Choice offered to / List presented to:             Status of service:   Medicare Important Message given?   (If response is "NO", the following Medicare IM given date fields will be blank) Date Medicare IM given:   Date Additional Medicare IM given:    Discharge Disposition:    Per UR Regulation:    If discussed at Long Length of Stay Meetings, dates discussed:    Comments:  03/14/13 Rosemary HolmsAmy Dillian Feig RN BSN CM

## 2013-03-15 LAB — BASIC METABOLIC PANEL
BUN: 7 mg/dL (ref 6–23)
CO2: 26 mEq/L (ref 19–32)
Calcium: 8.1 mg/dL — ABNORMAL LOW (ref 8.4–10.5)
Chloride: 102 mEq/L (ref 96–112)
Creatinine, Ser: 0.74 mg/dL (ref 0.50–1.35)
GFR calc Af Amer: >90 mL/min (ref >90)
GFR calc non Af Amer: >90 mL/min (ref >90)
Glucose, Bld: 185 mg/dL — ABNORMAL HIGH (ref 70–99)
Potassium: 3.1 mEq/L — ABNORMAL LOW (ref 3.7–5.3)
Sodium: 140 mEq/L (ref 137–147)

## 2013-03-15 LAB — CBC
HCT: 35.6 % — ABNORMAL LOW (ref 39.0–52.0)
Hemoglobin: 12.5 g/dL — ABNORMAL LOW (ref 13.0–17.0)
MCH: 31.2 pg (ref 26.0–34.0)
MCHC: 35.1 g/dL (ref 30.0–36.0)
MCV: 88.8 fL (ref 78.0–100.0)
Platelets: 166 10*3/uL (ref 150–400)
RBC: 4.01 MIL/uL — ABNORMAL LOW (ref 4.22–5.81)
RDW: 13.5 % (ref 11.5–15.5)
WBC: 9 10*3/uL (ref 4.0–10.5)

## 2013-03-15 LAB — GLUCOSE, CAPILLARY: Glucose-Capillary: 171 mg/dL — ABNORMAL HIGH (ref 70–99)

## 2013-03-15 NOTE — Progress Notes (Signed)
Patient being d/c home with parents. Verbalizes understanding of instructions. Work note given to patient. IV cath removed and intact. No pain/swelling at site. Patient walked out with parents and a patient belonging cart.

## 2013-03-15 NOTE — Discharge Instructions (Signed)

## 2013-03-15 NOTE — Discharge Summary (Signed)
Physician Discharge Summary  [name]:[id number] DOB: 1990/05/02 DOA: 03/11/2013  PCP: Ronnie Doss  Admit date: 03/11/2013 Discharge date: 03/15/2013  Time spent:  40 minutes  Recommendations for Outpatient Follow-up:  1. Followup with primary care physician for reevaluation of rash  Discharge Diagnoses:  Active Problems:   Acute bronchitis   Dehydration   DM (diabetes mellitus)   Nausea with vomiting   Diarrhea   Elevated liver enzymes   SIRS (systemic inflammatory response syndrome)   Hypotension, unspecified   Rash and nonspecific skin eruption   Discharge Condition: improved  Diet recommendation: low carb  Filed Weights   03/12/13 0007 03/12/13 0400 03/13/13 0500  Weight: 77 kg (169 lb 12.1 oz) 77.9 kg (171 lb 11.8 oz) 81.9 kg (180 lb 8.9 oz)    History of present illness:  23 year old male with a past medical history of diabetes mellitus, who came to the ED after patient started having symptoms of cough, nausea vomiting and diarrhea which started this morning. As per patient he noticed that he was coughing up yellow phlegm and then had almost 20 episodes of vomiting and 3 episodes of diarrhea. He denies any blood in the vomitus or stool. Denies any sick contacts at home. He denies any chest pain or shortness of breath associated with them. He did have abdominal pain which was cramping intact her and 5/10 in intensity located around the umbilicus. Patient also has diabetes and takes Lantus and sliding scale Apidra at home,  In the ED CAT scan of the abdomen was done which did not show significant abnormality. Chest x-ray is normal, patient continues to be hypotensive despite getting IV fluids boluses. Patient also has lactic acid of 1.49. WBC is normal, has mild elevation of lipase 152. Blood glucose is 263.  He admits to having fever, temperature is 101.7.   Hospital Course:  This patient was admitted to the hospital with nausea, vomiting and diarrhea. He  was febrile and hypotensive. He was noted to be significantly dehydrated. He was admitted to the step down unit for further treatments. He was started on aggressive hydration and broad-spectrum antibiotics. He was continued on his diabetes medications. Blood cultures did not show any significant growth. Chest x-ray did not have any acute findings. Stool studies were unremarkable. Urinalysis did not show any signs of infection. He was likely the patient had a viral illness. He did develop a macular rash on his extremities as well as trunk during his hospital stay. It was unclear if this was a viral rash versus a drug rash. All antibiotics were discontinued. His rash subsequently improved. The patient is feeling significantly improved. His fevers have resolved and he was tolerating a diet. His vomiting and diarrhea had resolved. He was requesting discharge home. Patient will follow up with his primary care physician one to 2 weeks.  Procedures:  none  Consultations:  none  Discharge Exam: Filed Vitals:   03/15/13 0533  BP: 123/60  Pulse: 103  Temp: 98.7 F (37.1 C)  Resp: 20    General: NAD Cardiovascular: S1, S2 RRR Respiratory: CTA B  Discharge Instructions  Discharge Orders   Future Orders Complete By Expires   Call MD for:  difficulty breathing, headache or visual disturbances  As directed    Call MD for:  persistant dizziness or light-headedness  As directed    Call MD for:  temperature >100.4  As directed    Diet - low sodium heart healthy  As directed  Increase activity slowly  As directed        Medication List         APIDRA 100 UNIT/ML injection  Generic drug:  insulin glulisine  Inject 10-16 Units into the skin 3 (three) times daily before meals. According to sliding scale at home     insulin glargine 100 UNIT/ML injection  Commonly known as:  LANTUS  Inject 34 Units into the skin at bedtime.       Allergies  Allergen Reactions  . Amoxicillin Rash  .  Levaquin [Levofloxacin] Rash       Follow-up Information   Follow up with [facility] EMERGENCY DEPARTMENT. (If symptoms worsen)    Specialty:  Emergency Medicine   Contact information:   [id number] [place] [address] [phone]      Follow up with WILLARD,JENNIFER, PA-C. (follow up in 1-2 weeks, call for appiontment)    Specialty:  Family Medicine   Contact information:   [address] [place] [address] [phone]        The results of significant diagnostics from this hospitalization (including imaging, microbiology, ancillary and laboratory) are listed below for reference.    Significant Diagnostic Studies: Dg Chest 2 View  03/14/2013   CLINICAL DATA:  Fever question pneumonia  EXAM: CHEST  2 VIEW  COMPARISON:  03/11/2013  FINDINGS: Decreased lung volumes.  Upper normal heart size.  Mediastinal contours and pulmonary vascularity normal.  Bibasilar opacities favor left lower lobe infiltrate and atelectasis versus infiltrate at right base as well.  Upper lungs clear.  Probable small bilateral pleural effusions.  Bones unremarkable.  IMPRESSION: Small bilateral pleural effusions suspected.  Bibasilar opacities favoring left lower lobe pneumonia and atelectasis versus infiltrate at right base.   Electronically Signed   By: Ulyses Southward M.D.   On: 03/14/2013 12:50   Dg Chest 2 View  03/11/2013   CLINICAL DATA:  Stomach ache, vomiting, cough  EXAM: CHEST  2 VIEW  COMPARISON:  DG CHEST 1 VIEW dated 11/01/2012; DG CHEST 2 VIEW dated 06/28/2011  FINDINGS: Unchanged cardiac silhouette and mediastinal contours. No focal airspace opacities. No pleural effusion or pneumothorax. No evidence of edema. Unchanged bones.  IMPRESSION: No acute cardiopulmonary disease.   Electronically Signed   By: Simonne Come M.D.   On: 03/11/2013 21:40   Ct Abdomen Pelvis W Contrast  03/11/2013   CLINICAL DATA:  Abdominal pain.  EXAM: CT ABDOMEN AND PELVIS WITH CONTRAST   TECHNIQUE: Multidetector CT imaging of the abdomen and pelvis was performed using the standard protocol following bolus administration of intravenous contrast.  CONTRAST:  50mL OMNIPAQUE IOHEXOL 300 MG/ML SOLN, OMNIPAQUE IOHEXOL 300 MG/ML SOLN  COMPARISON:  CT scan of April 09, 2010.  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 Visualized lung bases appear normal.  The liver, spleen and pancreas appear normal. No gallstones are noted. Adrenal glands appear normal. No hydronephrosis or renal obstruction is noted. Small nonobstructive calculus is seen in left kidney. No ureteral calculi are noted. The appendix appears normal. There is no evidence of bowel obstruction. Upper abdominal and mesenteric adenopathy is again noted and unchanged compared to prior exam. Bilateral inguinal adenopathy is also noted and unchanged. There is continued uniform wall thickening of the urinary bladder whichwas present on prior exam. No abnormal fluid collection is noted.  IMPRESSION: Small nonobstructive left renal calculus. No hydronephrosis or renal obstruction is seen.  Stable adenopathy is noted  in the retroperitoneal, mesenteric and bilateral inguinal regions which is unchanged compared to prior exam of 2012, and therefore most likely is inflammatory or reactive in nature.  Uniform wall thickening of urinary bladder is noted which was present on prior exam ; this is concerning for chronic cystitis or inflammation.   Electronically Signed   By: Roque Lias M.D.   On: 03/11/2013 19:07   US Abdomen Limited Ruq  03/12/2013   CLINICAL DATA:  Elevated LFTs  EXAM: US ABDOMEN LIMITED - RIGHT UPPER QUADRANT  COMPARISON:  CT ABD - PELV W/ CM dated 03/11/2013; CT ABD/PELVIS W CM dated 04/09/2010  FINDINGS: Gallbladder:  There is a potential minimal amount of layering mixed echogenic sludge (image 46) within otherwise normal-appearing gallbladder. No gallbladder wall thickening or pericholecystic fluid. Negative  sonographic Murphy's sign.  Common bile duct:  Diameter: Normal in size measuring 3.2 mm in diameter  Liver:  Homogeneous hepatic echotexture. No discrete hepatic lesions. No definite evidence of intrahepatic biliary ductal dilatation. No ascites.  IMPRESSION: Suspected minimal amount of layering echogenic sludge within otherwise normal-appearing gallbladder. Otherwise, unremarkable right upper quadrant abdominal ultrasound.   Electronically Signed   By: Simonne Come M.D.   On: 03/12/2013 12:25    Microbiology: Recent Results (from the past 240 hour(s))  CULTURE, BLOOD (ROUTINE X 2)     Status: None   Collection Time    03/11/13  9:28 PM      Result Value Ref Range Status   Specimen Description BLOOD RIGHT ANTECUBITAL   Final   Special Requests BOTTLES DRAWN AEROBIC AND ANAEROBIC 10CC   Final   Culture NO GROWTH 4 DAYS   Final   Report Status PENDING   Incomplete  CULTURE, BLOOD (ROUTINE X 2)     Status: None   Collection Time    03/11/13  9:30 PM      Result Value Ref Range Status   Specimen Description BLOOD LEFT HAND   Final   Special Requests BOTTLES DRAWN AEROBIC AND ANAEROBIC 8CC   Final   Culture NO GROWTH 4 DAYS   Final   Report Status PENDING   Incomplete  MRSA PCR SCREENING     Status: None   Collection Time    03/12/13  2:00 AM      Result Value Ref Range Status   MRSA by PCR NEGATIVE  NEGATIVE Final   Comment:            The GeneXpert MRSA Assay (FDA     approved for NASAL specimens     only), is one component of a     comprehensive MRSA colonization     surveillance program. It is not     intended to diagnose MRSA     infection nor to guide or     monitor treatment for     MRSA infections.  CLOSTRIDIUM DIFFICILE BY PCR     Status: None   Collection Time    03/12/13 11:24 AM      Result Value Ref Range Status   C difficile by pcr NEGATIVE  NEGATIVE Final     Labs: Basic Metabolic Panel:  Recent Labs Lab 03/11/13 2303 03/12/13 0540 03/13/13 0450  03/14/13 0517 03/15/13 0539  NA 134* 134* 136* 139 140  K 3.5* 3.7 3.1* 3.3* 3.1*  CL 102 102 103 103 102  CO2 21 22 20 23 26   GLUCOSE 277* 292* 197* 246* 185*  BUN 19 20 10  5* 7  CREATININE 1.12 1.02 0.93 0.78 0.74  CALCIUM 7.1* 6.9* 7.1* 7.8* 8.1*  MG  --   --  1.0* 1.7  --    Liver Function Tests:  Recent Labs Lab 03/11/13 1628 03/12/13 0540 03/13/13 0450 03/14/13 0517  AST 64* 105* 55* 20  ALT 44 63* 55* 38  ALKPHOS 102 85 123* 120*  BILITOT 0.6 0.5 0.5 0.5  PROT 6.9 5.1* 5.3* 5.3*  ALBUMIN 3.4* 2.2* 2.1* 2.1*    Recent Labs Lab 03/11/13 1628 03/12/13 0540  LIPASE 152* 51   No results found for this basename: AMMONIA,  in the last 168 hours CBC:  Recent Labs Lab 03/11/13 1628 03/12/13 0540 03/13/13 0450 03/14/13 0517 03/15/13 0539  WBC 8.2 8.9 8.2 9.9 9.0  NEUTROABS 7.3  --   --   --   --   HGB 16.1 13.2 13.8 13.1 12.5*  HCT 45.4 37.5* 38.5* 37.8* 35.6*  MCV 89.0 88.9 88.9 88.5 88.8  PLT 213 157 149* 138* 166   Cardiac Enzymes: No results found for this basename: CKTOTAL, CKMB, CKMBINDEX, TROPONINI,  in the last 168 hours BNP: BNP (last 3 results) No results found for this basename: PROBNP,  in the last 8760 hours CBG:  Recent Labs Lab 03/14/13 0756 03/14/13 1153 03/14/13 1618 03/14/13 2105 03/15/13 0731  GLUCAP 192* 233* 239* 286* 171*       Signed:  MEMON,JEHANZEB  [facility] 03/15/2013, 6:06 PM

## 2013-03-16 LAB — CULTURE, BLOOD (ROUTINE X 2)
Culture: NO GROWTH
Culture: NO GROWTH

## 2013-04-12 ENCOUNTER — Emergency Department (HOSPITAL_COMMUNITY): Payer: 59

## 2013-04-12 ENCOUNTER — Emergency Department (HOSPITAL_COMMUNITY)
Admission: EM | Admit: 2013-04-12 | Discharge: 2013-04-13 | Disposition: A | Discharge status: Home / Self Care | Payer: 59 | Attending: Emergency Medicine | Admitting: Emergency Medicine

## 2013-04-12 ENCOUNTER — Encounter (HOSPITAL_COMMUNITY): Payer: Self-pay | Admitting: Emergency Medicine

## 2013-04-12 DIAGNOSIS — S161XXA Strain of muscle, fascia and tendon at neck level, initial encounter: Secondary | ICD-10-CM

## 2013-04-12 DIAGNOSIS — IMO0002 Reserved for concepts with insufficient information to code with codable children: Secondary | ICD-10-CM | POA: Insufficient documentation

## 2013-04-12 DIAGNOSIS — S298XXA Other specified injuries of thorax, initial encounter: Secondary | ICD-10-CM | POA: Insufficient documentation

## 2013-04-12 DIAGNOSIS — E119 Type 2 diabetes mellitus without complications: Secondary | ICD-10-CM | POA: Insufficient documentation

## 2013-04-12 DIAGNOSIS — Z794 Long term (current) use of insulin: Secondary | ICD-10-CM | POA: Insufficient documentation

## 2013-04-12 DIAGNOSIS — S139XXA Sprain of joints and ligaments of unspecified parts of neck, initial encounter: Secondary | ICD-10-CM | POA: Insufficient documentation

## 2013-04-12 DIAGNOSIS — S301XXA Contusion of abdominal wall, initial encounter: Secondary | ICD-10-CM | POA: Insufficient documentation

## 2013-04-12 DIAGNOSIS — S0990XA Unspecified injury of head, initial encounter: Secondary | ICD-10-CM | POA: Insufficient documentation

## 2013-04-12 DIAGNOSIS — J329 Chronic sinusitis, unspecified: Secondary | ICD-10-CM | POA: Insufficient documentation

## 2013-04-12 DIAGNOSIS — Y9389 Activity, other specified: Secondary | ICD-10-CM | POA: Insufficient documentation

## 2013-04-12 DIAGNOSIS — Y92410 Unspecified street and highway as the place of occurrence of the external cause: Secondary | ICD-10-CM | POA: Insufficient documentation

## 2013-04-12 LAB — CBC WITH DIFFERENTIAL/PLATELET
Basophils Absolute: 0 10*3/uL (ref 0.0–0.1)
Basophils Relative: 0 % (ref 0–1)
Eosinophils Absolute: 0.1 10*3/uL (ref 0.0–0.7)
Eosinophils Relative: 1 % (ref 0–5)
HCT: 43 % (ref 39.0–52.0)
Hemoglobin: 14.9 g/dL (ref 13.0–17.0)
Lymphocytes Relative: 17 % (ref 12–46)
Lymphs Abs: 1.8 10*3/uL (ref 0.7–4.0)
MCH: 30.3 pg (ref 26.0–34.0)
MCHC: 34.7 g/dL (ref 30.0–36.0)
MCV: 87.4 fL (ref 78.0–100.0)
Monocytes Absolute: 0.7 10*3/uL (ref 0.1–1.0)
Monocytes Relative: 7 % (ref 3–12)
Neutro Abs: 7.9 10*3/uL — ABNORMAL HIGH (ref 1.7–7.7)
Neutrophils Relative %: 75 % (ref 43–77)
Platelets: 263 10*3/uL (ref 150–400)
RBC: 4.92 MIL/uL (ref 4.22–5.81)
RDW: 12.9 % (ref 11.5–15.5)
WBC: 10.6 10*3/uL — ABNORMAL HIGH (ref 4.0–10.5)

## 2013-04-12 LAB — BASIC METABOLIC PANEL
BUN: 14 mg/dL (ref 6–23)
CO2: 25 mEq/L (ref 19–32)
Calcium: 10.1 mg/dL (ref 8.4–10.5)
Chloride: 103 mEq/L (ref 96–112)
Creatinine, Ser: 0.86 mg/dL (ref 0.50–1.35)
GFR calc Af Amer: >90 mL/min (ref >90)
GFR calc non Af Amer: >90 mL/min (ref >90)
Glucose, Bld: 380 mg/dL — ABNORMAL HIGH (ref 70–99)
Potassium: 3.9 mEq/L (ref 3.7–5.3)
Sodium: 142 mEq/L (ref 137–147)

## 2013-04-12 MED ORDER — SODIUM CHLORIDE 0.9 % IV BOLUS (SEPSIS)
1000.0000 mL | Freq: Once | INTRAVENOUS | Status: AC
  Administered 2013-04-12: 1000 mL via INTRAVENOUS
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
## 2013-04-13 MED ORDER — IOHEXOL 300 MG/ML  SOLN
100.0000 mL | Freq: Once | INTRAMUSCULAR | Status: AC | PRN
  Administered 2013-04-13: 100 mL via INTRAVENOUS

## 2013-04-13 MED ORDER — AZITHROMYCIN 250 MG PO TABS: ORAL_TABLET | ORAL | Status: DC

## 2013-04-13 MED ORDER — HYDROCODONE-ACETAMINOPHEN 5-325 MG PO TABS: 2.0000 | ORAL_TABLET | ORAL | Status: DC | PRN

## 2013-04-13 MED ORDER — HYDROCODONE-ACETAMINOPHEN 5-325 MG PO TABS
2.0000 | ORAL_TABLET | Freq: Once | ORAL | Status: AC
  Administered 2013-04-13: 2 via ORAL
  Filled 2013-04-13: qty 2
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
## 2013-09-23 ENCOUNTER — Emergency Department (HOSPITAL_BASED_OUTPATIENT_CLINIC_OR_DEPARTMENT_OTHER)
Admission: EM | Admit: 2013-09-23 | Discharge: 2013-09-23 | Disposition: A | Discharge status: Home / Self Care | Payer: Worker's Compensation | Attending: Emergency Medicine | Admitting: Emergency Medicine

## 2013-09-23 ENCOUNTER — Encounter (HOSPITAL_BASED_OUTPATIENT_CLINIC_OR_DEPARTMENT_OTHER): Payer: Self-pay | Admitting: Emergency Medicine

## 2013-09-23 DIAGNOSIS — H5710 Ocular pain, unspecified eye: Secondary | ICD-10-CM | POA: Insufficient documentation

## 2013-09-23 DIAGNOSIS — H5789 Other specified disorders of eye and adnexa: Secondary | ICD-10-CM | POA: Diagnosis not present

## 2013-09-23 DIAGNOSIS — Z794 Long term (current) use of insulin: Secondary | ICD-10-CM | POA: Insufficient documentation

## 2013-09-23 DIAGNOSIS — E119 Type 2 diabetes mellitus without complications: Secondary | ICD-10-CM | POA: Insufficient documentation

## 2013-09-23 DIAGNOSIS — Z79899 Other long term (current) drug therapy: Secondary | ICD-10-CM | POA: Insufficient documentation

## 2013-09-23 DIAGNOSIS — H5712 Ocular pain, left eye: Secondary | ICD-10-CM

## 2013-09-23 DIAGNOSIS — Z880 Allergy status to penicillin: Secondary | ICD-10-CM | POA: Diagnosis not present

## 2013-09-23 DIAGNOSIS — Z792 Long term (current) use of antibiotics: Secondary | ICD-10-CM | POA: Diagnosis not present

## 2013-09-23 MED ORDER — ERYTHROMYCIN 5 MG/GM OP OINT
TOPICAL_OINTMENT | OPHTHALMIC | Status: AC
  Administered 2013-09-23: 1 via OPHTHALMIC
  Filled 2013-09-23: qty 3.5

## 2013-09-23 MED ORDER — ERYTHROMYCIN 5 MG/GM OP OINT
TOPICAL_OINTMENT | Freq: Once | OPHTHALMIC | Status: AC
  Administered 2013-09-23: 1 via OPHTHALMIC

## 2013-09-23 MED ORDER — FLUORESCEIN SODIUM 1 MG OP STRP
1.0000 | ORAL_STRIP | Freq: Once | OPHTHALMIC | Status: AC
  Administered 2013-09-23: 1 via OPHTHALMIC
  Filled 2013-09-23: qty 1

## 2013-09-23 MED ORDER — ERYTHROMYCIN 2 % EX OINT: TOPICAL_OINTMENT | CUTANEOUS | Status: DC

## 2013-09-23 MED ORDER — TETRACAINE HCL 0.5 % OP SOLN
2.0000 [drp] | Freq: Once | OPHTHALMIC | Status: AC
  Administered 2013-09-23: 2 [drp] via OPHTHALMIC
  Filled 2013-09-23: qty 2

## 2013-09-23 NOTE — ED Provider Notes (Signed)
CSN: [id number]     Arrival date & time 09/23/13  1333 History   First MD Initiated Contact with Patient 09/23/13 1458     Chief Complaint  Patient presents with  . Eye Pain     (Consider location/radiation/quality/duration/timing/severity/associated sxs/prior Treatment) Patient is a 23 y.o. male presenting with eye pain. The history is provided by the patient.  Eye Pain This is a new problem. The current episode started 3 to 5 hours ago. The problem occurs constantly. The problem has not changed since onset.Pertinent negatives include no chest pain and no abdominal pain. Nothing aggravates the symptoms. Nothing relieves the symptoms. He has tried nothing for the symptoms.    Past Medical History  Diagnosis Date  . Diabetes mellitus     insulin pump   History reviewed. No pertinent past surgical history. Family History  Problem Relation Age of Onset  . Diabetes     History  Substance Use Topics  . Smoking status: Never Smoker   . Smokeless tobacco: Never Used  . Alcohol Use: Yes     Comment: occasionally    Review of Systems  Constitutional: Negative for fever and chills.  Eyes: Positive for pain.  Cardiovascular: Negative for chest pain.  Gastrointestinal: Negative for abdominal pain.  All other systems reviewed and are negative.     Allergies  Amoxicillin and Levaquin  Home Medications   Prior to Admission medications   Medication Sig Start Date End Date Taking? Authorizing Provider  azithromycin (ZITHROMAX Z-PAK) 250 MG tablet 2 po day one, then 1 daily x 4 days 04/13/13   Geoffery Lyons, MD  HYDROcodone-acetaminophen (NORCO) 5-325 MG per tablet Take 2 tablets by mouth every 4 (four) hours as needed. 04/13/13   Geoffery Lyons, MD  insulin glargine (LANTUS) 100 UNIT/ML injection Inject 34 Units into the skin at bedtime.    Historical Provider, MD  insulin glulisine (APIDRA) 100 UNIT/ML injection Inject 10-16 Units into the skin 3 (three) times daily before meals.  According to sliding scale at home    Historical Provider, MD   BP 138/82  Pulse 89  Temp(Src) 98 F (36.7 C) (Oral)  Resp 18  Ht  (1.702 m)  Wt 180 lb (81.647 kg)  BMI 28.19 kg/m2  SpO2 100% Physical Exam  Constitutional: He is oriented to person, place, and time. He appears well-developed and well-nourished. No distress.  HENT:  Head: Normocephalic and atraumatic.  Mouth/Throat: Oropharynx is clear and moist. No oropharyngeal exudate.  Eyes: EOM are normal. Pupils are equal, round, and reactive to light. Lids are everted and swept, no foreign bodies found. Left eye exhibits no discharge and no exudate. No foreign body present in the left eye. Left conjunctiva is injected (mild, diffusely).  Neck: Normal range of motion. Neck supple.  Cardiovascular: Normal rate and regular rhythm.  Exam reveals no friction rub.   No murmur heard. Pulmonary/Chest: Effort normal and breath sounds normal. No respiratory distress. He has no wheezes. He has no rales.  Abdominal: He exhibits no distension. There is no tenderness. There is no rebound.  Musculoskeletal: Normal range of motion. He exhibits no edema.  Neurological: He is alert and oriented to person, place, and time.  Skin: He is not diaphoretic.    ED Course  Procedures (including critical care time) Labs Review Labs Reviewed - No data to display  Imaging Review No results found.   EKG Interpretation None      MDM   Final diagnoses:  Eye pain,  left    73M presents with L eye pain. Was banging a ladder with a hammer and felt L eye pain. Tearing, redness. No blurry vision. Diabetic on insulin, tetanus UTD.  No FB seen when lids everted. EOMs intact, pupil round, no irregularities. No uptake on fluoro stain. Lids swept. Instructed to f/u with Opto or Ophtho. Given erythromycin.  Elwin Mocha, MD 09/23/13 [phone]

## 2013-09-23 NOTE — ED Notes (Signed)
At work moving a ladder and a FB flew into his left eye. Pain and redness.

## 2014-10-05 ENCOUNTER — Encounter (HOSPITAL_BASED_OUTPATIENT_CLINIC_OR_DEPARTMENT_OTHER): Payer: Self-pay | Admitting: *Deleted

## 2014-10-05 ENCOUNTER — Emergency Department (HOSPITAL_BASED_OUTPATIENT_CLINIC_OR_DEPARTMENT_OTHER)
Admission: EM | Admit: 2014-10-05 | Discharge: 2014-10-05 | Disposition: A | Discharge status: Home / Self Care | Payer: BLUE CROSS/BLUE SHIELD | Attending: Emergency Medicine | Admitting: Emergency Medicine

## 2014-10-05 ENCOUNTER — Emergency Department (HOSPITAL_BASED_OUTPATIENT_CLINIC_OR_DEPARTMENT_OTHER): Payer: BLUE CROSS/BLUE SHIELD

## 2014-10-05 DIAGNOSIS — Z794 Long term (current) use of insulin: Secondary | ICD-10-CM | POA: Insufficient documentation

## 2014-10-05 DIAGNOSIS — Z880 Allergy status to penicillin: Secondary | ICD-10-CM | POA: Insufficient documentation

## 2014-10-05 DIAGNOSIS — R0789 Other chest pain: Secondary | ICD-10-CM | POA: Diagnosis not present

## 2014-10-05 DIAGNOSIS — R079 Chest pain, unspecified: Secondary | ICD-10-CM | POA: Diagnosis present

## 2014-10-05 DIAGNOSIS — E119 Type 2 diabetes mellitus without complications: Secondary | ICD-10-CM | POA: Diagnosis not present

## 2014-10-05 LAB — CBC WITH DIFFERENTIAL/PLATELET
Basophils Absolute: 0 10*3/uL (ref 0.0–0.1)
Basophils Relative: 0 % (ref 0–1)
Eosinophils Absolute: 0.2 10*3/uL (ref 0.0–0.7)
Eosinophils Relative: 3 % (ref 0–5)
HCT: 44.2 % (ref 39.0–52.0)
Hemoglobin: 15.1 g/dL (ref 13.0–17.0)
Lymphocytes Relative: 29 % (ref 12–46)
Lymphs Abs: 2 10*3/uL (ref 0.7–4.0)
MCH: 29.3 pg (ref 26.0–34.0)
MCHC: 34.2 g/dL (ref 30.0–36.0)
MCV: 85.8 fL (ref 78.0–100.0)
Monocytes Absolute: 0.7 10*3/uL (ref 0.1–1.0)
Monocytes Relative: 10 % (ref 3–12)
Neutro Abs: 4 10*3/uL (ref 1.7–7.7)
Neutrophils Relative %: 58 % (ref 43–77)
Platelets: 243 10*3/uL (ref 150–400)
RBC: 5.15 MIL/uL (ref 4.22–5.81)
RDW: 13.1 % (ref 11.5–15.5)
WBC: 6.9 10*3/uL (ref 4.0–10.5)

## 2014-10-05 LAB — BASIC METABOLIC PANEL
Anion gap: 8 (ref 5–15)
BUN: 20 mg/dL (ref 6–20)
CO2: 27 mmol/L (ref 22–32)
Calcium: 9.2 mg/dL (ref 8.9–10.3)
Chloride: 103 mmol/L (ref 101–111)
Creatinine, Ser: 0.86 mg/dL (ref 0.61–1.24)
GFR calc Af Amer: >60 mL/min (ref >60)
GFR calc non Af Amer: >60 mL/min (ref >60)
Glucose, Bld: 197 mg/dL — ABNORMAL HIGH (ref 65–99)
Potassium: 4.1 mmol/L (ref 3.5–5.1)
Sodium: 138 mmol/L (ref 135–145)

## 2014-10-05 LAB — TROPONIN I: Troponin I: <0.03 ng/mL (ref <0.031)

## 2014-10-05 LAB — CBG MONITORING, ED: Glucose-Capillary: 198 mg/dL — ABNORMAL HIGH (ref 65–99)

## 2014-10-05 NOTE — ED Provider Notes (Signed)
CSN: [id number]     Arrival date & time 10/05/14  1134 History   First MD Initiated Contact with Patient 10/05/14 1139     Chief Complaint  Patient presents with  . Chest Pain     (Consider location/radiation/quality/duration/timing/severity/associated sxs/prior Treatment) Patient is a 24 y.o. male presenting with chest pain. The history is provided by the patient.  Chest Pain Pain location:  Substernal area and L chest Pain quality: sharp   Pain quality comment:  Squeezing Pain radiates to:  Does not radiate Pain radiates to the back: no   Pain severity:  Moderate Onset quality:  Sudden Duration:  8 hours Timing:  Intermittent Progression:  Waxing and waning Chronicity:  New Context: at rest   Context: not breathing   Relieved by:  Nothing Worsened by:  Nothing tried Ineffective treatments:  None tried Associated symptoms: no cough, no fever and no shortness of breath     Past Medical History  Diagnosis Date  . Diabetes mellitus     insulin pump   History reviewed. No pertinent past surgical history. Family History  Problem Relation Age of Onset  . Diabetes     Social History  Substance Use Topics  . Smoking status: Never Smoker   . Smokeless tobacco: Never Used  . Alcohol Use: Yes     Comment: occasionally    Review of Systems  Constitutional: Negative for fever.  Respiratory: Negative for cough and shortness of breath.   Cardiovascular: Positive for chest pain.  All other systems reviewed and are negative.     Allergies  Amoxicillin and Levaquin  Home Medications   Prior to Admission medications   Medication Sig Start Date End Date Taking? Authorizing Provider  insulin aspart (NOVOLOG) 100 unit/mL injection Inject 10 Units into the skin 3 (three) times daily before meals.   Yes Historical Provider, MD  Insulin Glargine (TOUJEO SOLOSTAR [place]) Inject into the skin.   Yes Historical Provider, MD  azithromycin (ZITHROMAX Z-PAK) 250 MG tablet 2 po day  one, then 1 daily x 4 days 04/13/13   Geoffery Lyons, MD  Erythromycin 2 % ointment Apply to affected area 2 times daily 09/23/13   Elwin Mocha, MD  HYDROcodone-acetaminophen [facility]) 5-325 MG per tablet Take 2 tablets by mouth every 4 (four) hours as needed. 04/13/13   Geoffery Lyons, MD  insulin glargine (LANTUS) 100 UNIT/ML injection Inject 34 Units into the skin at bedtime.    Historical Provider, MD  insulin glulisine (APIDRA) 100 UNIT/ML injection Inject 10-16 Units into the skin 3 (three) times daily before meals. According to sliding scale at home    Historical Provider, MD   BP 115/71 mmHg  Pulse 87  Temp(Src) 98.1 F (36.7 C) (Oral)  Resp 16  Ht 5\' 7"  (1.702 m)  Wt 190 lb (86.183 kg)  BMI 29.75 kg/m2  SpO2 98% Physical Exam  Constitutional: He is oriented to person, place, and time. He appears well-developed and well-nourished. No distress.  HENT:  Head: Normocephalic and atraumatic.  Eyes: Conjunctivae are normal.  Neck: Neck supple. No tracheal deviation present.  Cardiovascular: Normal rate and regular rhythm.   Pulmonary/Chest: Effort normal. No respiratory distress.  Abdominal: Soft. He exhibits no distension.  Neurological: He is alert and oriented to person, place, and time.  Skin: Skin is warm and dry.  Psychiatric: He has a normal mood and affect.    ED Course  Procedures (including critical care time) Labs Review Labs Reviewed  BASIC METABOLIC PANEL -  Abnormal; Notable for the following:    Glucose, Bld 197 (*)    All other components within normal limits  CBG MONITORING, ED - Abnormal; Notable for the following:    Glucose-Capillary 198 (*)    All other components within normal limits  TROPONIN I  CBC WITH DIFFERENTIAL/PLATELET    Imaging Review Dg Chest 2 View  10/05/2014   CLINICAL DATA:  Left-sided chest pain for 2 days. Initial encounter.  EXAM: CHEST  2 VIEW  COMPARISON:  04/12/2013.  FINDINGS: Cardiopericardial silhouette within normal limits.  Mediastinal contours normal. Trachea midline. No airspace disease or effusion.  IMPRESSION: No active cardiopulmonary disease.   Electronically Signed   By: Andreas Newport M.D.   On: 10/05/2014 13:45   I have personally reviewed and evaluated these images and lab results as part of my medical decision-making.   EKG Interpretation   Date/Time:  Thursday October 05 2014 11:48:59 EDT Ventricular Rate:  67 PR Interval:  140 QRS Duration: 104 QT Interval:  390 QTC Calculation: 412 R Axis:   33 Text Interpretation:  Normal sinus rhythm with sinus arrhythmia RSR prime  in V1 likley normal variant Abnormal ECG Confirmed by Doroteo Nickolson MD, Reuel Boom  ([id number]) on 10/05/2014 12:20:01 PM      MDM   Final diagnoses:  Chest wall pain   24 y.o. male type 1 diabetic male presents with chest pain. Has atypical symptoms, heart score low, doubt ACS. PERC negative. Suspect MSK etiology currently. Plan to follow up with PCP as needed and return precautions discussed for worsening or new concerning symptoms.     Lyndal Pulley, MD 10/06/14 [phone]

## 2014-10-05 NOTE — Discharge Instructions (Signed)

## 2014-10-05 NOTE — ED Notes (Signed)
Left sided chest pain x 2 days. Sometimes squeezing and other times it is sharp.

## 2014-10-17 ENCOUNTER — Encounter (INDEPENDENT_AMBULATORY_CARE_PROVIDER_SITE_OTHER): Payer: BLUE CROSS/BLUE SHIELD | Admitting: Ophthalmology

## 2014-10-17 DIAGNOSIS — H4603 Optic papillitis, bilateral: Secondary | ICD-10-CM | POA: Diagnosis not present

## 2014-10-17 DIAGNOSIS — E11339 Type 2 diabetes mellitus with moderate nonproliferative diabetic retinopathy without macular edema: Secondary | ICD-10-CM

## 2014-10-17 DIAGNOSIS — H43813 Vitreous degeneration, bilateral: Secondary | ICD-10-CM

## 2014-10-17 DIAGNOSIS — E11331 Type 2 diabetes mellitus with moderate nonproliferative diabetic retinopathy with macular edema: Secondary | ICD-10-CM | POA: Diagnosis not present

## 2014-10-17 DIAGNOSIS — E10311 Type 1 diabetes mellitus with unspecified diabetic retinopathy with macular edema: Secondary | ICD-10-CM | POA: Diagnosis not present

## 2015-02-19 ENCOUNTER — Ambulatory Visit (INDEPENDENT_AMBULATORY_CARE_PROVIDER_SITE_OTHER): Payer: BLUE CROSS/BLUE SHIELD | Admitting: Ophthalmology

## 2015-03-20 ENCOUNTER — Ambulatory Visit (INDEPENDENT_AMBULATORY_CARE_PROVIDER_SITE_OTHER): Payer: Worker's Compensation

## 2015-03-20 ENCOUNTER — Ambulatory Visit (INDEPENDENT_AMBULATORY_CARE_PROVIDER_SITE_OTHER): Payer: Worker's Compensation | Admitting: Family Medicine

## 2015-03-20 VITALS — BP 111/74 | HR 92 | Temp 97.5°F | Ht 66.0 in | Wt 200.0 lb

## 2015-03-20 DIAGNOSIS — S8391XA Sprain of unspecified site of right knee, initial encounter: Secondary | ICD-10-CM | POA: Diagnosis not present

## 2015-03-20 DIAGNOSIS — M25561 Pain in right knee: Secondary | ICD-10-CM | POA: Diagnosis not present

## 2015-03-20 MED ORDER — DICLOFENAC SODIUM 75 MG PO TBEC: 75.0000 mg | DELAYED_RELEASE_TABLET | Freq: Two times a day (BID) | ORAL | Status: DC

## 2015-03-20 NOTE — Progress Notes (Signed)
   Subjective:  Patient ID: [name], male    DOB: 1990/06/03  Age: 25 y.o. MRN: [id number]  CC: Knee Pain   HPI DEJAUN VIDRIO presents for fell backwards at work yesterday when pallet jack slipped.. To prevent hitting his head hemoved foot backward, but fell on top of it. Ice & elevation since. Painful up inside of leg to the knee at work this morning [employer].). Pain is now a dull ache throughout the anterior medial and lateral aspects of the knee including the patella. With the exception of the medial joint line which is sharp. Pain is 6.5/10. Worse with ambulation than at rest.    ROS Review of Systems  Objective:  BP 111/74 mmHg  Pulse 92  Temp(Src) 97.5 F (36.4 C) (Oral)  Ht  (1.676 m)  Wt 200 lb (90.719 kg)  BMI 32.30 kg/m2  SpO2 97%  Physical Exam  Constitutional: He appears well-developed and well-nourished.  HENT:  Head: Normocephalic and atraumatic.  Right Ear: External ear normal.  Left Ear: External ear normal.  Mouth/Throat: No oropharyngeal exudate or posterior oropharyngeal erythema.  Eyes: Pupils are equal, round, and reactive to light.  Neck: Normal range of motion. Neck supple.  Cardiovascular: Normal rate and regular rhythm.   No murmur heard. Pulmonary/Chest: Breath sounds normal. No respiratory distress.  Abdominal: Bowel sounds are normal.  Musculoskeletal: He exhibits no edema.  Theright knee has full range of motion without discomfort actively and passively. Gait is normal without a limp. The joint lines are tender. The patella is palpable without  edema.  Lachman / anterior drawer signs are negative for signs of instability and pain free. McMurray testing reveals no pop or excessive discomfort. Varus and valgus stress maneuvers do not cause ligamentous stretch or instability.   Vitals reviewed.   Assessment & Plan:   Cato was seen today for knee pain.  Diagnoses and all orders for this visit:  Knee pain, acute, right -      DG Knee 1-2 Views Right -     Knee brace  Knee sprain, right, initial encounter  Other orders -     diclofenac (VOLTAREN) 75 MG EC tablet; Take 1 tablet (75 mg total) by mouth 2 (two) times daily.   I have discontinued Mr. Lalor insulin glargine, insulin glulisine, HYDROcodone-acetaminophen, azithromycin, and Erythromycin. I am also having him start on diclofenac. Additionally, I am having him maintain his Insulin Glargine (TOUJEO SOLOSTAR [place]) and insulin aspart.  Meds ordered this encounter  Medications  . diclofenac (VOLTAREN) 75 MG EC tablet    Sig: Take 1 tablet (75 mg total) by mouth 2 (two) times daily.    Dispense:  60 tablet    Refill:  2     Follow-up: Return in about 2 weeks (around 04/03/2015).  Mechele Claude, M.D.

## 2015-03-23 ENCOUNTER — Encounter: Payer: Self-pay | Admitting: Family Medicine

## 2015-03-23 ENCOUNTER — Ambulatory Visit (INDEPENDENT_AMBULATORY_CARE_PROVIDER_SITE_OTHER): Payer: Worker's Compensation | Admitting: Family Medicine

## 2015-03-23 ENCOUNTER — Telehealth: Payer: Self-pay | Admitting: Family Medicine

## 2015-03-23 VITALS — BP 127/78 | HR 117 | Temp 98.8°F | Ht 66.0 in | Wt 204.4 lb

## 2015-03-23 DIAGNOSIS — S83241D Other tear of medial meniscus, current injury, right knee, subsequent encounter: Secondary | ICD-10-CM | POA: Diagnosis not present

## 2015-03-23 DIAGNOSIS — S83241S Other tear of medial meniscus, current injury, right knee, sequela: Secondary | ICD-10-CM

## 2015-03-23 MED ORDER — METHYLPREDNISOLONE ACETATE 80 MG/ML IJ SUSP
80.0000 mg | Freq: Once | INTRAMUSCULAR | Status: AC
  Administered 2015-03-23: 80 mg via INTRA_ARTICULAR

## 2015-03-23 NOTE — Telephone Encounter (Signed)
Left mother detailed message that if patient wants to be seen again to contact employer and they will have to call and make appt

## 2015-03-23 NOTE — Progress Notes (Signed)
BP 127/78 mmHg  Pulse 117  Temp(Src) 98.8 F (37.1 C) (Oral)  Ht  (1.676 m)  Wt 204 lb 6.4 oz (92.715 kg)  BMI 33.01 kg/m2   Subjective:    Patient ID: [name], male    DOB: [birth year], 25 y.o.   MRN: [id number]  HPI: [name] is a 25 y.o. male presenting on 03/23/2015 for Worker's comp followup   HPI Knee pain Worker's Comp. Patient initially injured his knee on 03/19/2015 and was seen here in our office on 03/20/2015. He works a Herbalist. He initially fell back on his knee awkwardly and landed with his knee pulled up underneath him on his backside and had a lot of swelling in his knee and pain especially with walking and prolonged standing. Today is coming back because yesterday when he was working he tried lifts something that was higher than what he probably should've been and felt the knee give out underneath him as well and then he had more pain. The pain today is 5 out of 10. He has been using anti-inflammatories such as Advil and the Voltaren. They have been helping some but not completely. He does feel over the past week that his knee does catch and give out underneath him  Relevant past medical, surgical, family and social history reviewed and updated as indicated. Interim medical history since our last visit reviewed. Allergies and medications reviewed and updated.  Review of Systems  Constitutional: Negative for fever.  HENT: Negative for ear discharge and ear pain.   Eyes: Negative for discharge and visual disturbance.  Respiratory: Negative for shortness of breath and wheezing.   Cardiovascular: Negative for chest pain and leg swelling.  Gastrointestinal: Negative for abdominal pain, diarrhea and constipation.  Genitourinary: Negative for difficulty urinating.  Musculoskeletal: Positive for joint swelling and arthralgias. Negative for back pain and gait problem.  Skin: Negative for rash.  Neurological: Negative for syncope, light-headedness and  headaches.  All other systems reviewed and are negative.   Per HPI unless specifically indicated above     Medication List       This list is accurate as of: 03/23/15  2:17 PM.  Always use your most recent med list.               diclofenac 75 MG EC tablet  Commonly known as:  VOLTAREN  Take 1 tablet (75 mg total) by mouth 2 (two) times daily.     insulin aspart 100 unit/mL injection  Commonly known as:  novoLOG  Inject 10 Units into the skin 3 (three) times daily before meals.     TOUJEO SOLOSTAR   Inject into the skin.           Objective:    BP 127/78 mmHg  Pulse 117  Temp(Src) 98.8 F (37.1 C) (Oral)  Ht  (1.676 m)  Wt 204 lb 6.4 oz (92.715 kg)  BMI 33.01 kg/m2  Wt Readings from Last 3 Encounters:  03/23/15 204 lb 6.4 oz (92.715 kg)  03/20/15 200 lb (90.719 kg)  10/05/14 190 lb (86.183 kg)    Physical Exam  Constitutional: He is oriented to person, place, and time. He appears well-developed and well-nourished. No distress.  Eyes: Conjunctivae and EOM are normal. Pupils are equal, round, and reactive to light. Right eye exhibits no discharge. No scleral icterus.  Cardiovascular: Normal rate, regular rhythm, normal heart sounds and intact distal pulses.   No murmur heard. Pulmonary/Chest: Effort normal  and breath sounds normal. No respiratory distress. He has no wheezes.  Musculoskeletal: Normal range of motion. He exhibits no edema.       Right knee: He exhibits effusion (mild effusion) and abnormal meniscus. He exhibits normal range of motion, no swelling, no erythema, normal alignment, no LCL laxity, normal patellar mobility, no bony tenderness and no MCL laxity. Tenderness found. Medial joint line tenderness noted. No MCL, no LCL and no patellar tendon tenderness noted.  Neurological: He is alert and oriented to person, place, and time. Coordination normal.  Skin: Skin is warm and dry. No rash noted. He is not diaphoretic.  Psychiatric: He has a  normal mood and affect. His behavior is normal.  Nursing note and vitals reviewed.   Knee injection: Risk factors of bleeding and infection discussed with patient and patient is agreeable towards injection. Patient prepped with Betadine. Lateral approach towards injection used. Injected 80 mg of Depo-Medrol and 1 mL of 2% lidocaine. Patient tolerated procedure well and no side effects from noted. Minimal to no bleeding. Simple bandage applied after.     Assessment & Plan:   Problem List Items Addressed This Visit    None    Visit Diagnoses    Acute medial meniscal tear, right, sequela    -  Primary    We'll do steroid injection and referral to orthopedic, worker's comp case    Relevant Medications    methylPREDNISolone acetate (DEPO-MEDROL) injection 80 mg (Start on 03/23/2015  2:30 PM)    Other Relevant Orders    Ambulatory referral to Orthopedic Surgery       Limitations: Patient is to not lift carry or pressure for more than 20 pounds. He is also to alternate sitting and standing and avoid prolonged standing.  Follow up plan: Return if symptoms worsen or fail to improve.  Counseling provided for all of the vaccine components Orders Placed This Encounter  Procedures  . Ambulatory referral to Orthopedic Surgery    Arville Care, MD [facility] 03/23/2015, 2:17 PM

## 2015-03-26 ENCOUNTER — Other Ambulatory Visit: Payer: Self-pay | Admitting: Family Medicine

## 2015-04-10 ENCOUNTER — Other Ambulatory Visit: Payer: Self-pay | Admitting: Family Medicine

## 2015-05-21 ENCOUNTER — Ambulatory Visit (HOSPITAL_COMMUNITY): Payer: BLUE CROSS/BLUE SHIELD | Admitting: Physical Therapy

## 2017-01-09 IMAGING — CR DG CHEST 2V
2 series · 2 of 2 positions shown · non-contrast
Comparison: 04/12/2013.

CLINICAL DATA: Left-sided chest pain for 2 days. Initial encounter.

EXAM:
CHEST  2 VIEW

[w chest pa]
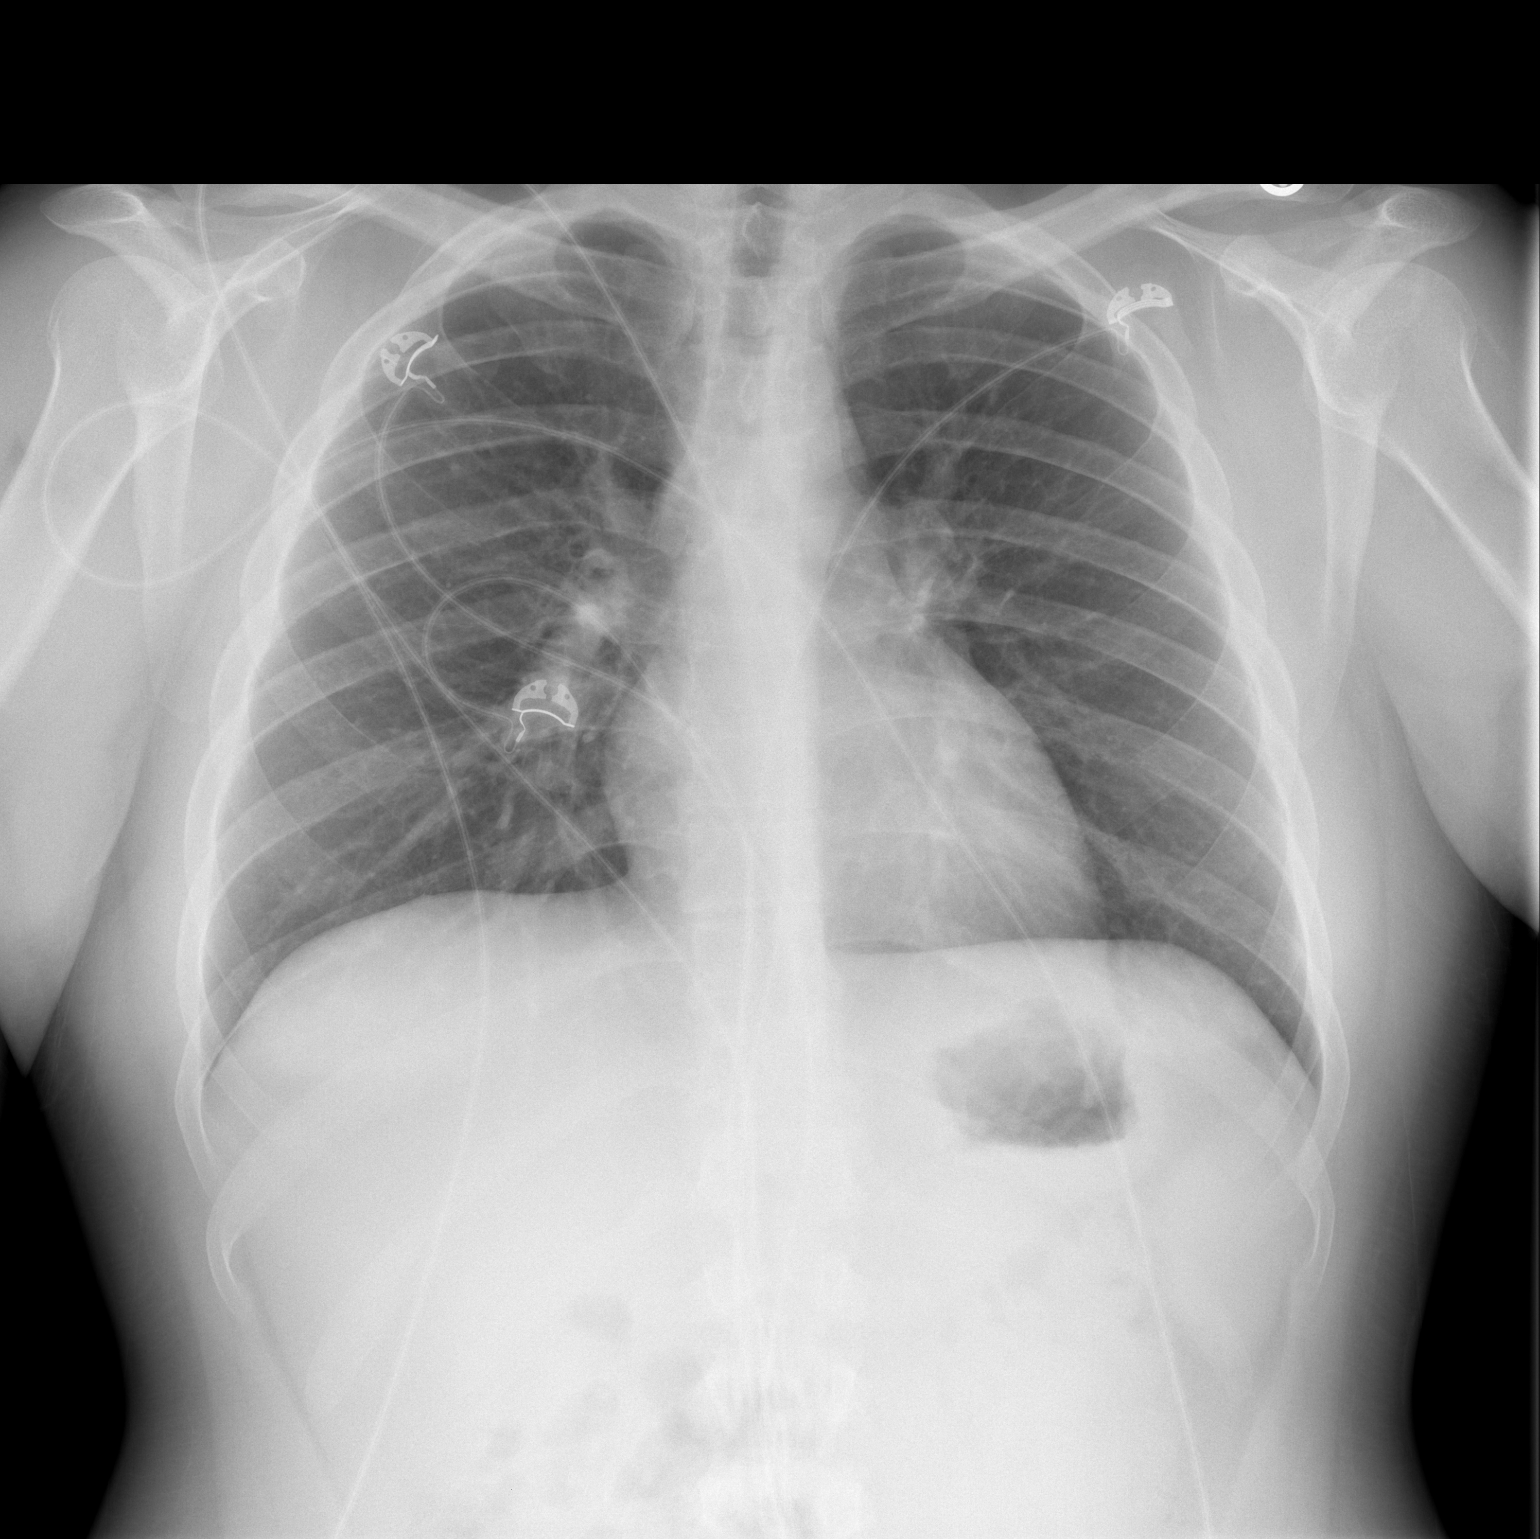

[w chest lat]
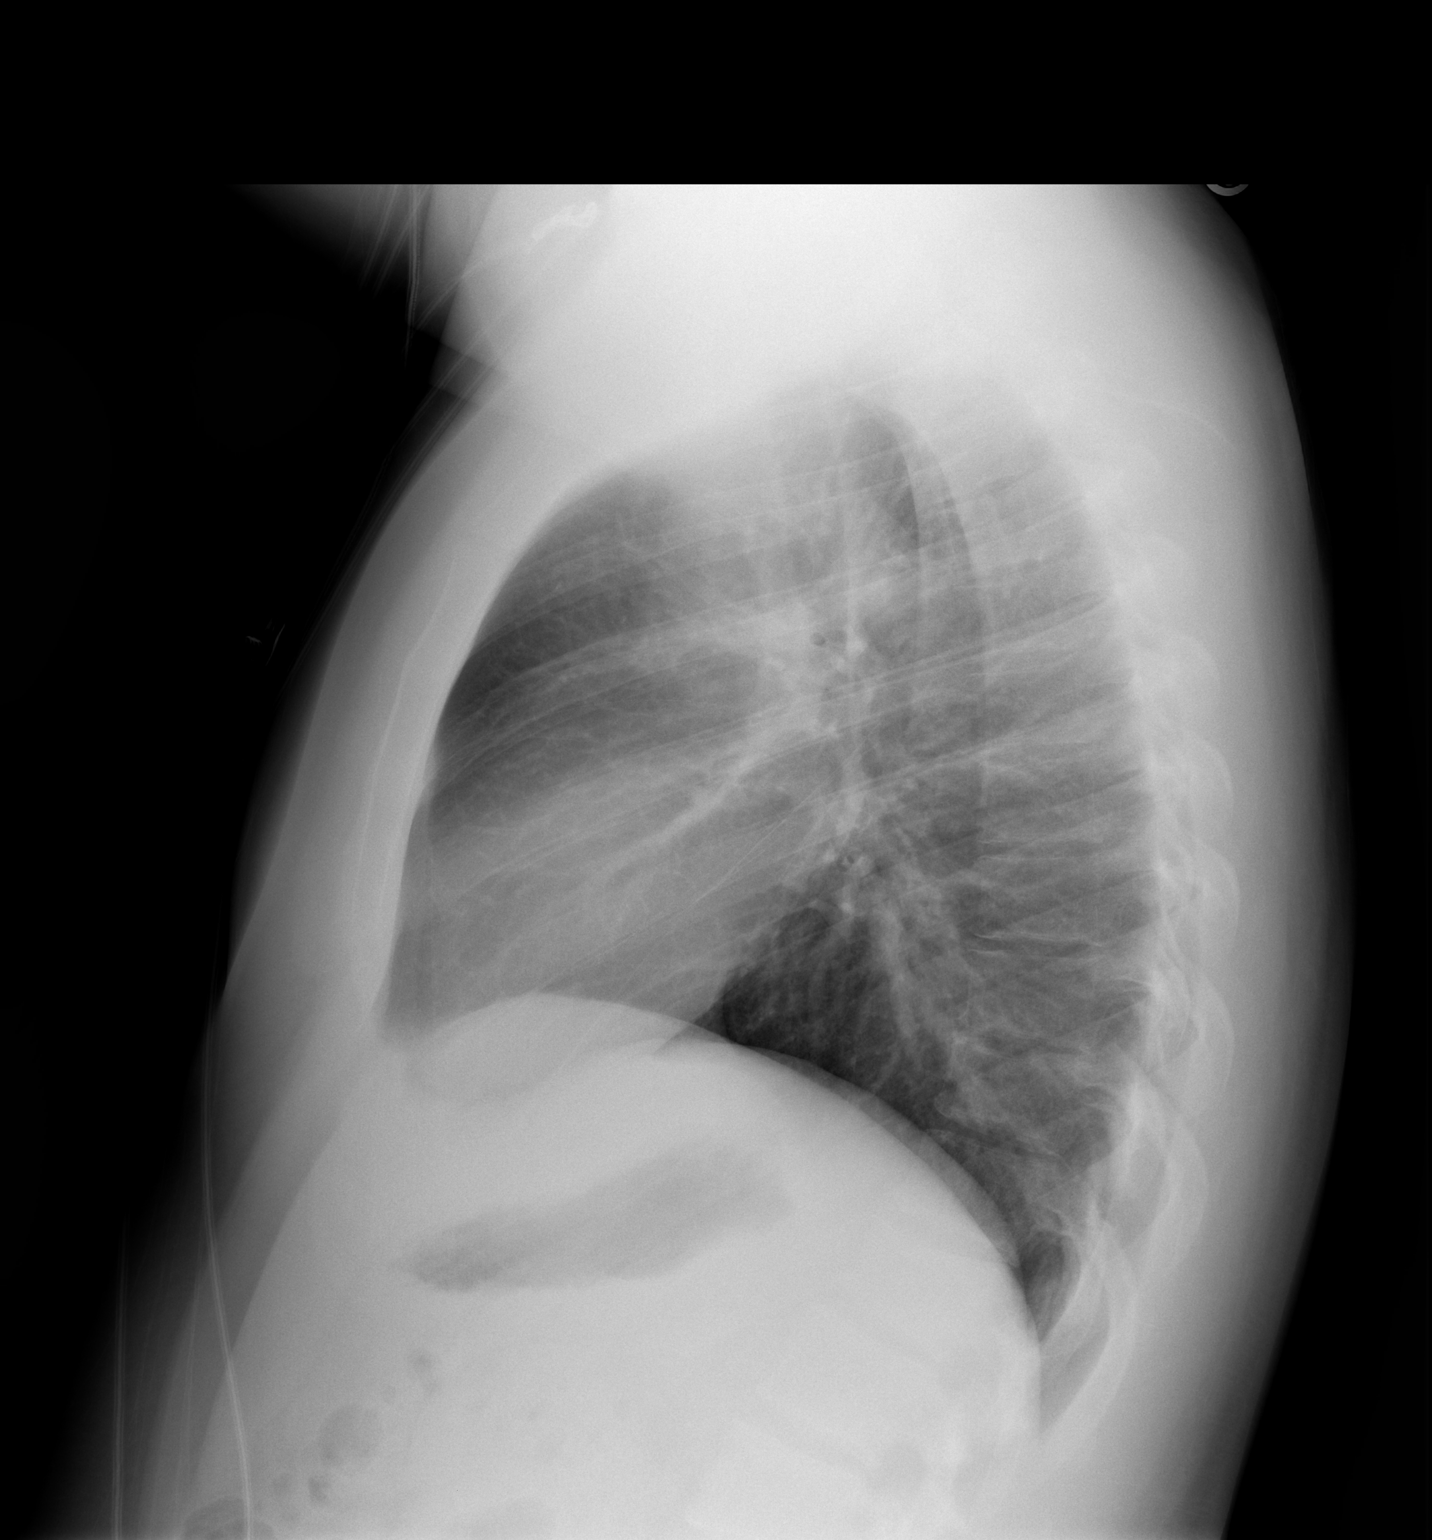

[2 of 2 positions shown; findings below may reference images not displayed]

FINDINGS: Cardiopericardial silhouette within normal limits. Mediastinal
contours normal. Trachea midline. No airspace disease or effusion.
IMPRESSION: No active cardiopulmonary disease.

## 2020-04-25 DIAGNOSIS — F321 Major depressive disorder, single episode, moderate: Secondary | ICD-10-CM | POA: Diagnosis present

## 2020-04-25 DIAGNOSIS — E669 Obesity, unspecified: Secondary | ICD-10-CM | POA: Insufficient documentation

## 2020-11-19 DIAGNOSIS — E1065 Type 1 diabetes mellitus with hyperglycemia: Secondary | ICD-10-CM | POA: Diagnosis present

## 2022-06-29 ENCOUNTER — Inpatient Hospital Stay (HOSPITAL_COMMUNITY)
Admission: EM | Admit: 2022-06-29 | Discharge: 2022-07-10 | DRG: 853 | Disposition: A | Discharge status: Home Health | Payer: BLUE CROSS/BLUE SHIELD | Attending: Internal Medicine | Admitting: Internal Medicine

## 2022-06-29 ENCOUNTER — Emergency Department (HOSPITAL_COMMUNITY): Payer: BLUE CROSS/BLUE SHIELD

## 2022-06-29 ENCOUNTER — Other Ambulatory Visit: Payer: Self-pay

## 2022-06-29 ENCOUNTER — Encounter (HOSPITAL_COMMUNITY): Payer: Self-pay | Admitting: Emergency Medicine

## 2022-06-29 DIAGNOSIS — K612 Anorectal abscess: Secondary | ICD-10-CM | POA: Diagnosis not present

## 2022-06-29 DIAGNOSIS — G9341 Metabolic encephalopathy: Secondary | ICD-10-CM | POA: Diagnosis not present

## 2022-06-29 DIAGNOSIS — D75839 Thrombocytosis, unspecified: Secondary | ICD-10-CM | POA: Diagnosis not present

## 2022-06-29 DIAGNOSIS — F4321 Adjustment disorder with depressed mood: Secondary | ICD-10-CM | POA: Diagnosis present

## 2022-06-29 DIAGNOSIS — E11649 Type 2 diabetes mellitus with hypoglycemia without coma: Secondary | ICD-10-CM | POA: Diagnosis not present

## 2022-06-29 DIAGNOSIS — J810 Acute pulmonary edema: Secondary | ICD-10-CM | POA: Diagnosis not present

## 2022-06-29 DIAGNOSIS — J9601 Acute respiratory failure with hypoxia: Secondary | ICD-10-CM | POA: Diagnosis present

## 2022-06-29 DIAGNOSIS — K611 Rectal abscess: Secondary | ICD-10-CM

## 2022-06-29 DIAGNOSIS — Z794 Long term (current) use of insulin: Secondary | ICD-10-CM | POA: Diagnosis not present

## 2022-06-29 DIAGNOSIS — E876 Hypokalemia: Secondary | ICD-10-CM | POA: Diagnosis not present

## 2022-06-29 DIAGNOSIS — E860 Dehydration: Secondary | ICD-10-CM | POA: Diagnosis present

## 2022-06-29 DIAGNOSIS — L0231 Cutaneous abscess of buttock: Secondary | ICD-10-CM | POA: Diagnosis present

## 2022-06-29 DIAGNOSIS — Z881 Allergy status to other antibiotic agents status: Secondary | ICD-10-CM | POA: Diagnosis not present

## 2022-06-29 DIAGNOSIS — A401 Sepsis due to streptococcus, group B: Secondary | ICD-10-CM | POA: Diagnosis not present

## 2022-06-29 DIAGNOSIS — E1152 Type 2 diabetes mellitus with diabetic peripheral angiopathy with gangrene: Secondary | ICD-10-CM | POA: Diagnosis present

## 2022-06-29 DIAGNOSIS — N493 Fournier gangrene: Secondary | ICD-10-CM | POA: Diagnosis not present

## 2022-06-29 DIAGNOSIS — F321 Major depressive disorder, single episode, moderate: Secondary | ICD-10-CM | POA: Diagnosis not present

## 2022-06-29 DIAGNOSIS — N5089 Other specified disorders of the male genital organs: Secondary | ICD-10-CM | POA: Diagnosis present

## 2022-06-29 DIAGNOSIS — R6520 Severe sepsis without septic shock: Secondary | ICD-10-CM | POA: Diagnosis present

## 2022-06-29 DIAGNOSIS — N179 Acute kidney failure, unspecified: Secondary | ICD-10-CM | POA: Diagnosis present

## 2022-06-29 DIAGNOSIS — D638 Anemia in other chronic diseases classified elsewhere: Secondary | ICD-10-CM | POA: Diagnosis present

## 2022-06-29 DIAGNOSIS — Z880 Allergy status to penicillin: Secondary | ICD-10-CM

## 2022-06-29 DIAGNOSIS — Z833 Family history of diabetes mellitus: Secondary | ICD-10-CM | POA: Diagnosis not present

## 2022-06-29 DIAGNOSIS — J80 Acute respiratory distress syndrome: Secondary | ICD-10-CM | POA: Diagnosis not present

## 2022-06-29 DIAGNOSIS — F411 Generalized anxiety disorder: Secondary | ICD-10-CM | POA: Diagnosis present

## 2022-06-29 DIAGNOSIS — L03317 Cellulitis of buttock: Secondary | ICD-10-CM

## 2022-06-29 DIAGNOSIS — E1165 Type 2 diabetes mellitus with hyperglycemia: Secondary | ICD-10-CM | POA: Diagnosis present

## 2022-06-29 DIAGNOSIS — E8770 Fluid overload, unspecified: Secondary | ICD-10-CM | POA: Diagnosis not present

## 2022-06-29 DIAGNOSIS — E872 Acidosis, unspecified: Secondary | ICD-10-CM | POA: Diagnosis not present

## 2022-06-29 DIAGNOSIS — E669 Obesity, unspecified: Secondary | ICD-10-CM | POA: Diagnosis not present

## 2022-06-29 DIAGNOSIS — A0472 Enterocolitis due to Clostridium difficile, not specified as recurrent: Secondary | ICD-10-CM | POA: Insufficient documentation

## 2022-06-29 DIAGNOSIS — Z79899 Other long term (current) drug therapy: Secondary | ICD-10-CM

## 2022-06-29 DIAGNOSIS — Z6834 Body mass index (BMI) 34.0-34.9, adult: Secondary | ICD-10-CM

## 2022-06-29 DIAGNOSIS — A419 Sepsis, unspecified organism: Secondary | ICD-10-CM

## 2022-06-29 DIAGNOSIS — E1065 Type 1 diabetes mellitus with hyperglycemia: Secondary | ICD-10-CM

## 2022-06-29 LAB — LACTIC ACID, PLASMA
Lactic Acid, Venous: 2.3 mmol/L (ref 0.5–1.9)
Lactic Acid, Venous: 2.7 mmol/L (ref 0.5–1.9)
Lactic Acid, Venous: 1 mmol/L (ref 0.5–1.9)
Lactic Acid, Venous: 2.3 mmol/L (ref 0.5–1.9)

## 2022-06-29 LAB — CBC WITH DIFFERENTIAL/PLATELET
Abs Immature Granulocytes: 0.13 10*3/uL — ABNORMAL HIGH (ref 0.00–0.07)
Basophils Absolute: 0.1 10*3/uL (ref 0.0–0.1)
Basophils Relative: 1 %
Eosinophils Absolute: 0.1 10*3/uL (ref 0.0–0.5)
Eosinophils Relative: 1 %
HCT: 41.8 % (ref 39.0–52.0)
Hemoglobin: 13.9 g/dL (ref 13.0–17.0)
Immature Granulocytes: 1 %
Lymphocytes Relative: 7 %
Lymphs Abs: 1.3 10*3/uL (ref 0.7–4.0)
MCH: 28.5 pg (ref 26.0–34.0)
MCHC: 33.3 g/dL (ref 30.0–36.0)
MCV: 85.8 fL (ref 80.0–100.0)
Monocytes Absolute: 1.1 10*3/uL — ABNORMAL HIGH (ref 0.1–1.0)
Monocytes Relative: 6 %
Neutro Abs: 16.9 10*3/uL — ABNORMAL HIGH (ref 1.7–7.7)
Neutrophils Relative %: 84 %
Platelets: 308 10*3/uL (ref 150–400)
RBC: 4.87 MIL/uL (ref 4.22–5.81)
RDW: 13.5 % (ref 11.5–15.5)
WBC: 19.6 10*3/uL — ABNORMAL HIGH (ref 4.0–10.5)
nRBC: 0 % (ref 0.0–0.2)

## 2022-06-29 LAB — COMPREHENSIVE METABOLIC PANEL
ALT: 20 U/L (ref 0–44)
AST: 21 U/L (ref 15–41)
Albumin: 3.3 g/dL — ABNORMAL LOW (ref 3.5–5.0)
Alkaline Phosphatase: 105 U/L (ref 38–126)
Anion gap: 10 (ref 5–15)
BUN: 21 mg/dL — ABNORMAL HIGH (ref 6–20)
CO2: 23 mmol/L (ref 22–32)
Calcium: 8.6 mg/dL — ABNORMAL LOW (ref 8.9–10.3)
Chloride: 101 mmol/L (ref 98–111)
Creatinine, Ser: 1.48 mg/dL — ABNORMAL HIGH (ref 0.61–1.24)
GFR, Estimated: >60 mL/min (ref >60)
Glucose, Bld: 109 mg/dL — ABNORMAL HIGH (ref 70–99)
Potassium: 3.2 mmol/L — ABNORMAL LOW (ref 3.5–5.1)
Sodium: 134 mmol/L — ABNORMAL LOW (ref 135–145)
Total Bilirubin: 1 mg/dL (ref 0.3–1.2)
Total Protein: 7.3 g/dL (ref 6.5–8.1)

## 2022-06-29 LAB — URINALYSIS, W/ REFLEX TO CULTURE (INFECTION SUSPECTED)
Bacteria, UA: NONE SEEN
Bilirubin Urine: NEGATIVE
Glucose, UA: >=500 mg/dL — AB
Ketones, ur: NEGATIVE mg/dL
Leukocytes,Ua: NEGATIVE
Nitrite: NEGATIVE
Protein, ur: 100 mg/dL — AB
Specific Gravity, Urine: 1.018 (ref 1.005–1.030)
pH: 5 (ref 5.0–8.0)

## 2022-06-29 LAB — AEROBIC CULTURE W GRAM STAIN (SUPERFICIAL SPECIMEN)

## 2022-06-29 LAB — APTT: aPTT: 28 seconds (ref 24–36)

## 2022-06-29 LAB — BLOOD GAS, VENOUS
Acid-Base Excess: 2 mmol/L (ref 0.0–2.0)
Bicarbonate: 27.3 mmol/L (ref 20.0–28.0)
Drawn by: 27160
O2 Saturation: 40.9 %
Patient temperature: 37.6
pCO2, Ven: 45 mmHg (ref 44–60)
pH, Ven: 7.39 (ref 7.25–7.43)
pO2, Ven: <31 mmHg — CL (ref 32–45)

## 2022-06-29 LAB — LIPASE, BLOOD: Lipase: 30 U/L (ref 11–51)

## 2022-06-29 LAB — CULTURE, BLOOD (ROUTINE X 2)

## 2022-06-29 LAB — PROTIME-INR
INR: 1.1 (ref 0.8–1.2)
Prothrombin Time: 14 seconds (ref 11.4–15.2)

## 2022-06-29 LAB — CBG MONITORING, ED: Glucose-Capillary: 104 mg/dL — ABNORMAL HIGH (ref 70–99)

## 2022-06-29 MED ORDER — SODIUM CHLORIDE 0.9 % IV SOLN
2.0000 g | Freq: Once | INTRAVENOUS | Status: AC
  Administered 2022-06-29: 2 g via INTRAVENOUS
  Filled 2022-06-29: qty 20

## 2022-06-29 MED ORDER — KETOROLAC TROMETHAMINE 30 MG/ML IJ SOLN
30.0000 mg | Freq: Once | INTRAMUSCULAR | Status: AC
  Administered 2022-06-29: 30 mg via INTRAVENOUS
  Filled 2022-06-29: qty 1

## 2022-06-29 MED ORDER — LACTATED RINGERS IV BOLUS
2000.0000 mL | Freq: Once | INTRAVENOUS | Status: AC
  Administered 2022-06-29: 2000 mL via INTRAVENOUS

## 2022-06-29 MED ORDER — LACTATED RINGERS IV BOLUS
1000.0000 mL | Freq: Once | INTRAVENOUS | Status: AC
  Administered 2022-06-29: 1000 mL via INTRAVENOUS

## 2022-06-29 MED ORDER — ACETAMINOPHEN 500 MG PO TABS
1000.0000 mg | ORAL_TABLET | Freq: Once | ORAL | Status: AC
  Administered 2022-06-29: 1000 mg via ORAL
  Filled 2022-06-29: qty 2

## 2022-06-29 MED ORDER — IOHEXOL 300 MG/ML  SOLN
100.0000 mL | Freq: Once | INTRAMUSCULAR | Status: AC | PRN
  Administered 2022-06-29: 100 mL via INTRAVENOUS

## 2022-06-29 MED ORDER — LIDOCAINE-EPINEPHRINE 1 %-1:100000 IJ SOLN
10.0000 mL | Freq: Once | INTRAMUSCULAR | Status: AC
  Administered 2022-06-29: 10 mL
  Filled 2022-06-29: qty 10

## 2022-06-29 MED ORDER — ONDANSETRON HCL 4 MG/2ML IJ SOLN
4.0000 mg | Freq: Once | INTRAMUSCULAR | Status: AC
  Administered 2022-06-29: 4 mg via INTRAVENOUS
  Filled 2022-06-29: qty 2

## 2022-06-29 NOTE — ED Notes (Signed)
Notified provider of temp of 100 and heart rate of 119.

## 2022-06-29 NOTE — ED Notes (Signed)
Patient will be transported to [facility] per PA for admission and continuity of care.. Patient and wife notified of transfer.

## 2022-06-29 NOTE — ED Notes (Signed)
Patient repositioned, rectal exam performed by PA nurse assisted in room. Patient tolerated procedure with out any problems noted.

## 2022-06-29 NOTE — Progress Notes (Signed)
[facility]  Perianal abscess ED has attempted drainage but no major pus. Poorly controlled diabetic, definitely needs to rule out necrotizing fasciitis given the location and his diabetes. Needs CT sooner rather than later. No CT at [facility] as it is broken. No MRI on weekend.  ED PA describes no crepitus, no bullae, no pain out of proportion.   If only has perianal abscess can set up for drainage  tomorrow, hospitalist admission, fluids, antibiotics.  If necrotizing infection, he would ultimately get transferred to [facility] as we are not equipped to do serial debridements.  I think more reasonable to stay at [facility] after CT if nec fas on the table given the eventual transfer.   PA will keep me updated.  Algis Greenhouse, MD

## 2022-06-29 NOTE — ED Triage Notes (Addendum)
Pt via POV sent from Urgent Care of dx cellulitis. Pt had a small abscess to left buttock since Wednesday which has increased in size, redness, warmth, and pain and now covers most of the area. Pain 10/10, allergic to amoxicillin and levaquin. Last tdap over 10 years ago

## 2022-06-29 NOTE — Sepsis Progress Note (Signed)
Elink following code sepsis °

## 2022-06-29 NOTE — ED Provider Notes (Signed)
[place] EMERGENCY DEPARTMENT AT [facility] Provider Note   CSN: [id number] Arrival date & time: 06/29/22  1512     History  Chief Complaint  Patient presents with   Cellulitis    [name] is a 32 y.o. male.  With a history of insulin-dependent type 1 diabetes who presents to the ED for evaluation of cellulitis of the left buttock.  He noticed a boil on Wednesday.  He states the area has progressively gotten larger.  He has since developed some fevers, chills, nausea, vomiting and headache.  He presented to urgent care prior to arrival and was encouraged to come to the ED for further evaluation.  States his blood sugar has been approximately 400 lately.  He has been using his insulin. Used insulin prior to arrival as well.  He denies bowel or bladder difficulties.  He rates the pain a 10 out of 10. He received an injection of Toradol at urgent care prior to arrival.  HPI     Home Medications Prior to Admission medications   Medication Sig Start Date End Date Taking? Authorizing Provider  clonazePAM (KLONOPIN) 0.5 MG tablet Take 0.5 mg by mouth as needed for anxiety. 10/23/21  Yes [provider]  escitalopram (LEXAPRO) 5 MG tablet Take 1 tablet by mouth at bedtime. 06/25/22  Yes [provider]  ibuprofen (ADVIL) 200 MG tablet Take 400 mg by mouth every 6 (six) hours as needed for moderate pain.   Yes [provider]  Insulin Glargine (TOUJEO SOLOSTAR Aneth) Inject 60 Units into the skin at bedtime.   Yes [provider]  insulin lispro (HUMALOG) 100 UNIT/ML KwikPen Inject 10-20 Units into the skin in the morning, at noon, in the evening, and at bedtime. Before meals sliding scale 05/28/21  Yes [provider]      Allergies    Amoxicillin and Levaquin [levofloxacin]    Review of Systems   Review of Systems  Physical Exam Updated Vital Signs BP 126/68   Pulse (!) 128   Temp (!) 100.8 F (38.2 C) (Oral)   Resp 17   Ht  5\' 7"  (1.702 m)   Wt 100.7 kg   SpO2 94%   BMI 34.77 kg/m  Physical Exam Vitals and nursing note reviewed. Exam conducted with a chaperone present ([place], Charity fundraiser).  Constitutional:      General: He is not in acute distress.    Appearance: Normal appearance. He is well-developed. He is not ill-appearing, toxic-appearing or diaphoretic.  HENT:     Head: Normocephalic and atraumatic.  Eyes:     Conjunctiva/sclera: Conjunctivae normal.  Cardiovascular:     Rate and Rhythm: Normal rate and regular rhythm.     Heart sounds: No murmur heard. Pulmonary:     Effort: Pulmonary effort is normal. No respiratory distress.     Breath sounds: Normal breath sounds.  Abdominal:     Palpations: Abdomen is soft.     Tenderness: There is no abdominal tenderness. There is no guarding.  Genitourinary:      Comments: Approximately 4 cm x 2 cm area of erythema and induration to the left medial gluteus.  No fluctuance.  No purulent drainage.  No obvious masses or deformities on digital rectal exam. Musculoskeletal:        General: No swelling.     Cervical back: Neck supple.  Skin:    General: Skin is warm and dry.     Capillary Refill: Capillary refill takes less  than 2 seconds.  Neurological:     Mental Status: He is alert.  Psychiatric:        Mood and Affect: Mood normal.     ED Results / Procedures / Treatments   Labs (all labs ordered are listed, but only abnormal results are displayed) Labs Reviewed  LACTIC ACID, PLASMA - Abnormal; Notable for the following components:      Result Value   Lactic Acid, Venous 2.3 (*)    All other components within normal limits  LACTIC ACID, PLASMA - Abnormal; Notable for the following components:   Lactic Acid, Venous 2.7 (*)    All other components within normal limits  COMPREHENSIVE METABOLIC PANEL - Abnormal; Notable for the following components:   Sodium 134 (*)    Potassium 3.2 (*)    Glucose, Bld 109 (*)    BUN 21 (*)    Creatinine, Ser 1.48  (*)    Calcium 8.6 (*)    Albumin 3.3 (*)    All other components within normal limits  CBC WITH DIFFERENTIAL/PLATELET - Abnormal; Notable for the following components:   WBC 19.6 (*)    Neutro Abs 16.9 (*)    Monocytes Absolute 1.1 (*)    Abs Immature Granulocytes 0.13 (*)    All other components within normal limits  URINALYSIS, W/ REFLEX TO CULTURE (INFECTION SUSPECTED) - Abnormal; Notable for the following components:   Glucose, UA >=500 (*)    Hgb urine dipstick MODERATE (*)    Protein, ur 100 (*)    All other components within normal limits  BLOOD GAS, VENOUS - Abnormal; Notable for the following components:   pO2, Ven <31 (*)    All other components within normal limits  LACTIC ACID, PLASMA - Abnormal; Notable for the following components:   Lactic Acid, Venous 2.3 (*)    All other components within normal limits  CBG MONITORING, ED - Abnormal; Notable for the following components:   Glucose-Capillary 104 (*)    All other components within normal limits  CULTURE, BLOOD (ROUTINE X 2)  CULTURE, BLOOD (ROUTINE X 2)  AEROBIC CULTURE W GRAM STAIN (SUPERFICIAL SPECIMEN)  LIPASE, BLOOD  LACTIC ACID, PLASMA  APTT  PROTIME-INR    EKG EKG Interpretation  Date/Time:  Sunday June 29 2022 16:01:52 EDT Ventricular Rate:  125 PR Interval:  142 QRS Duration: 102 QT Interval:  302 QTC Calculation: 436 R Axis:   142 Text Interpretation: Sinus tachycardia Consider right ventricular hypertrophy Borderline ST elevation, lateral leads No acute changes No significant change since last tracing Confirmed by Derwood Kaplan [phone]) on 06/29/2022 4:38:26 PM  Radiology No results found.  Procedures .Marland KitchenIncision and Drainage  Date/Time: 06/29/2022 5:07 PM  Performed by: Michelle Piper, PA-C Authorized by: Michelle Piper, PA-C   Consent:    Consent obtained:  Verbal   Risks discussed:  Bleeding, incomplete drainage and infection   Alternatives discussed:  No  treatment Universal protocol:    Procedure explained and questions answered to patient or proxy's satisfaction: yes     Relevant documents present and verified: yes     Test results available : yes     Immediately prior to procedure, a time out was called: yes     Patient identity confirmed:  Verbally with patient and arm band Location:    Type:  Abscess   Size:  4 cm x 2 cm   Location:  Anogenital   Anogenital location:  Gluteal cleft Pre-procedure details:  Skin preparation:  Chlorhexidine with alcohol Anesthesia:    Anesthesia method:  Local infiltration   Local anesthetic:  Lidocaine 1% WITH epi Procedure type:    Complexity:  Simple Procedure details:    Incision types:  Single straight   Wound management:  Probed and deloculated and irrigated with saline   Drainage:  Bloody   Drainage amount:  Scant   Wound treatment:  Wound left open Post-procedure details:    Procedure completion:  Tolerated well, no immediate complications .Critical Care  Performed by: Michelle Piper, PA-C Authorized by: Michelle Piper, PA-C   Critical care provider statement:    Critical care time (minutes):  120   Critical care was necessary to treat or prevent imminent or life-threatening deterioration of the following conditions:  Sepsis   Critical care was time spent personally by me on the following activities:  Development of treatment plan with patient or surrogate, discussions with consultants, evaluation of patient's response to treatment, examination of patient, ordering and review of laboratory studies, ordering and review of radiographic studies, ordering and performing treatments and interventions, pulse oximetry, re-evaluation of patient's condition and review of old charts   Care discussed with: admitting provider   Comments:     Sepsis due to cellulitis in the setting of poorly controlled type 1 diabetes     Medications Ordered in ED Medications  lactated ringers bolus  2,000 mL (0 mLs Intravenous Stopped 06/29/22 1750)  ondansetron (ZOFRAN) injection 4 mg (4 mg Intravenous Given 06/29/22 1638)  lidocaine-EPINEPHrine (XYLOCAINE W/EPI) 1 %-1:100000 (with pres) injection 10 mL (10 mLs Infiltration Given 06/29/22 1701)  cefTRIAXone (ROCEPHIN) 2 g in sodium chloride 0.9 % 100 mL IVPB (0 g Intravenous Stopped 06/29/22 1910)  acetaminophen (TYLENOL) tablet 1,000 mg (1,000 mg Oral Given 06/29/22 1919)  lactated ringers bolus 1,000 mL (0 mLs Intravenous Stopped 06/29/22 2047)  ondansetron (ZOFRAN) injection 4 mg (4 mg Intravenous Given 06/29/22 1957)  ketorolac (TORADOL) 30 MG/ML injection 30 mg (30 mg Intravenous Given 06/29/22 2045)    ED Course/ Medical Decision Making/ A&P Clinical Course as of 06/29/22 2131  Sun Jun 29, 2022  1832 Spoke with hospitalist Dr. Mariea Clonts who requests general surgery consultation.  May need CT pelvis to further assess area of infection which is not possible at this time since our CT machine is currently nonfunctional [AS]  1903 Spoke with general surgery Dr. Henreitta Leber.  She recommends obtaining CT abdomen and pelvis prior to decision admission.  If necrotizing fasciitis is present, will need admission to [facility].  Otherwise he would be okay being transferred back to [facility] for admission. [AS]  1949 Plan to transfer to [facility].  Dr.Trifan accepting physician [AS]    Clinical Course User Index [AS] Britlyn Martine, Edsel Petrin, PA-C                             Medical Decision Making Amount and/or Complexity of Data Reviewed Labs: ordered. Radiology: ordered. ECG/medicine tests: ordered.  Risk OTC drugs. Prescription drug management.  This patient presents to the ED for concern of gluteal cellulitis, this involves an extensive number of treatment options, and is a complaint that carries with it a high risk of complications and morbidity.  The differential diagnosis includes cellulitis, abscess, sepsis  Co morbidities that complicate the  patient evaluation   type 1 diabetes  My initial workup includes sepsis workup, I&D  Additional history obtained from: Nursing notes  from this visit. Family significant other is at bedside and provides a portion of the history  I ordered, reviewed and interpreted labs which include: CBC, CMP, lipase, urinalysis, blood cultures, lactate, VBG, APTT, INR.  Initial lactate 2.3.  Delta lactate 2.7.  Third lactate 1.0.  Mild hyponatremia of 134.  Hypokalemia of 3.2.  Hyperglycemia of 109.  Creatinine elevated to 1.48.  Leukocytosis of 19.6  I ordered imaging studies including CT abdomen pelvis I independently visualized and interpreted imaging which showed pending I agree with the radiologist interpretation  Cardiac Monitoring:  The patient was maintained on a cardiac monitor.  I personally viewed and interpreted the cardiac monitored which showed an underlying rhythm of: Sinus tachycardia  Consultations Obtained:  Consultations described in ED clinical course  32 year old male presenting to the ED meeting SIRS criteria.  Has cellulitis to the left buttock.  Type 1 diabetes which appears to be poorly controlled.  Had a blood sugar of greater than 400 prior to arrival.  Sepsis workup was initiated.  Labs described above.  Unfortunately we do not have CT imaging capabilities at [facility] as the machine is currently inoperable.  I also do not have nonemergent ultrasound or MRI capabilities.  Patient will be transferred to [facility] for CT abdomen pelvis to identify possible surgical etiologies of his sepsis.  If there are emergent abnormalities such as necrotizing fasciitis or deeper fluid collections, general surgery will be consulted at [facility].  Otherwise he will be admitted to hospitalist service for continued management of his sepsis.  Plan was discussed with patient.  He is in agreement with this plan.  Patient's case discussed with Dr. Rhunette Croft who agrees with plan to transfer to  [facility].  Case discussed with Dr. Renaye Rakers at [facility] who will assume care of this patient.  Final disposition and decision making are pending CT abdomen pelvis.  Note: Portions of this report may have been transcribed using voice recognition software. Every effort was made to ensure accuracy; however, inadvertent computerized transcription errors may still be present.        Final Clinical Impression(s) / ED Diagnoses Final diagnoses:  Sepsis without acute organ dysfunction, due to unspecified organism [facility])  Cellulitis of buttock, left    Rx / DC Orders ED Discharge Orders     None         Michelle Piper, Cordelia Poche 06/29/22 2131    Derwood Kaplan, MD 07/01/22 [phone]

## 2022-06-30 ENCOUNTER — Other Ambulatory Visit: Payer: Self-pay

## 2022-06-30 ENCOUNTER — Encounter (HOSPITAL_COMMUNITY)
Admission: EM | Disposition: A | Discharge status: Home Health | Payer: Self-pay | Source: Home / Self Care | Attending: Family Medicine

## 2022-06-30 ENCOUNTER — Inpatient Hospital Stay (HOSPITAL_COMMUNITY): Payer: BLUE CROSS/BLUE SHIELD | Admitting: Certified Registered Nurse Anesthetist

## 2022-06-30 ENCOUNTER — Encounter (HOSPITAL_COMMUNITY): Payer: Self-pay | Admitting: Internal Medicine

## 2022-06-30 DIAGNOSIS — A419 Sepsis, unspecified organism: Secondary | ICD-10-CM

## 2022-06-30 DIAGNOSIS — E876 Hypokalemia: Secondary | ICD-10-CM | POA: Diagnosis present

## 2022-06-30 DIAGNOSIS — N179 Acute kidney failure, unspecified: Secondary | ICD-10-CM | POA: Diagnosis present

## 2022-06-30 DIAGNOSIS — G9341 Metabolic encephalopathy: Secondary | ICD-10-CM | POA: Diagnosis not present

## 2022-06-30 DIAGNOSIS — E1152 Type 2 diabetes mellitus with diabetic peripheral angiopathy with gangrene: Secondary | ICD-10-CM | POA: Diagnosis present

## 2022-06-30 DIAGNOSIS — N493 Fournier gangrene: Secondary | ICD-10-CM | POA: Diagnosis present

## 2022-06-30 DIAGNOSIS — L03317 Cellulitis of buttock: Secondary | ICD-10-CM

## 2022-06-30 DIAGNOSIS — D638 Anemia in other chronic diseases classified elsewhere: Secondary | ICD-10-CM | POA: Diagnosis present

## 2022-06-30 DIAGNOSIS — D75839 Thrombocytosis, unspecified: Secondary | ICD-10-CM | POA: Diagnosis not present

## 2022-06-30 DIAGNOSIS — E860 Dehydration: Secondary | ICD-10-CM | POA: Diagnosis present

## 2022-06-30 DIAGNOSIS — R6520 Severe sepsis without septic shock: Secondary | ICD-10-CM | POA: Diagnosis present

## 2022-06-30 DIAGNOSIS — E1165 Type 2 diabetes mellitus with hyperglycemia: Secondary | ICD-10-CM | POA: Diagnosis present

## 2022-06-30 DIAGNOSIS — J80 Acute respiratory distress syndrome: Secondary | ICD-10-CM | POA: Diagnosis not present

## 2022-06-30 DIAGNOSIS — Z881 Allergy status to other antibiotic agents status: Secondary | ICD-10-CM | POA: Diagnosis not present

## 2022-06-30 DIAGNOSIS — A401 Sepsis due to streptococcus, group B: Secondary | ICD-10-CM | POA: Diagnosis present

## 2022-06-30 DIAGNOSIS — K611 Rectal abscess: Secondary | ICD-10-CM | POA: Diagnosis not present

## 2022-06-30 DIAGNOSIS — Z794 Long term (current) use of insulin: Secondary | ICD-10-CM

## 2022-06-30 DIAGNOSIS — L0231 Cutaneous abscess of buttock: Secondary | ICD-10-CM | POA: Diagnosis present

## 2022-06-30 DIAGNOSIS — K612 Anorectal abscess: Secondary | ICD-10-CM | POA: Diagnosis present

## 2022-06-30 DIAGNOSIS — Z833 Family history of diabetes mellitus: Secondary | ICD-10-CM | POA: Diagnosis not present

## 2022-06-30 DIAGNOSIS — E10628 Type 1 diabetes mellitus with other skin complications: Secondary | ICD-10-CM | POA: Diagnosis not present

## 2022-06-30 DIAGNOSIS — J9601 Acute respiratory failure with hypoxia: Secondary | ICD-10-CM | POA: Diagnosis not present

## 2022-06-30 DIAGNOSIS — E669 Obesity, unspecified: Secondary | ICD-10-CM | POA: Diagnosis present

## 2022-06-30 DIAGNOSIS — E11649 Type 2 diabetes mellitus with hypoglycemia without coma: Secondary | ICD-10-CM | POA: Diagnosis not present

## 2022-06-30 DIAGNOSIS — F321 Major depressive disorder, single episode, moderate: Secondary | ICD-10-CM | POA: Diagnosis present

## 2022-06-30 DIAGNOSIS — R9431 Abnormal electrocardiogram [ECG] [EKG]: Secondary | ICD-10-CM | POA: Diagnosis not present

## 2022-06-30 DIAGNOSIS — E872 Acidosis, unspecified: Secondary | ICD-10-CM | POA: Diagnosis present

## 2022-06-30 DIAGNOSIS — J810 Acute pulmonary edema: Secondary | ICD-10-CM | POA: Diagnosis not present

## 2022-06-30 DIAGNOSIS — R0609 Other forms of dyspnea: Secondary | ICD-10-CM | POA: Diagnosis not present

## 2022-06-30 HISTORY — PX: INCISION AND DRAINAGE PERIRECTAL ABSCESS: SHX1804

## 2022-06-30 HISTORY — DX: Cutaneous abscess of buttock: L02.31

## 2022-06-30 LAB — LACTIC ACID, PLASMA
Lactic Acid, Venous: 1.8 mmol/L (ref 0.5–1.9)
Lactic Acid, Venous: 2.2 mmol/L (ref 0.5–1.9)

## 2022-06-30 LAB — GLUCOSE, CAPILLARY
Glucose-Capillary: 260 mg/dL — ABNORMAL HIGH (ref 70–99)
Glucose-Capillary: 242 mg/dL — ABNORMAL HIGH (ref 70–99)
Glucose-Capillary: 272 mg/dL — ABNORMAL HIGH (ref 70–99)
Glucose-Capillary: 244 mg/dL — ABNORMAL HIGH (ref 70–99)
Glucose-Capillary: 226 mg/dL — ABNORMAL HIGH (ref 70–99)
Glucose-Capillary: 242 mg/dL — ABNORMAL HIGH (ref 70–99)

## 2022-06-30 LAB — CBC WITH DIFFERENTIAL/PLATELET
Abs Immature Granulocytes: 0 10*3/uL (ref 0.00–0.07)
Basophils Absolute: 0 10*3/uL (ref 0.0–0.1)
Basophils Relative: 0 %
Eosinophils Absolute: 0.2 10*3/uL (ref 0.0–0.5)
Eosinophils Relative: 1 %
HCT: 35.6 % — ABNORMAL LOW (ref 39.0–52.0)
Hemoglobin: 11.2 g/dL — ABNORMAL LOW (ref 13.0–17.0)
Lymphocytes Relative: 4 %
Lymphs Abs: 0.8 10*3/uL (ref 0.7–4.0)
MCH: 27.9 pg (ref 26.0–34.0)
MCHC: 31.5 g/dL (ref 30.0–36.0)
MCV: 88.8 fL (ref 80.0–100.0)
Monocytes Absolute: 1.5 10*3/uL — ABNORMAL HIGH (ref 0.1–1.0)
Monocytes Relative: 7 %
Neutro Abs: 18.6 10*3/uL — ABNORMAL HIGH (ref 1.7–7.7)
Neutrophils Relative %: 88 %
Platelets: 240 10*3/uL (ref 150–400)
RBC: 4.01 MIL/uL — ABNORMAL LOW (ref 4.22–5.81)
RDW: 13.5 % (ref 11.5–15.5)
WBC: 21.1 10*3/uL — ABNORMAL HIGH (ref 4.0–10.5)
nRBC: 0 % (ref 0.0–0.2)

## 2022-06-30 LAB — BASIC METABOLIC PANEL
Anion gap: 12 (ref 5–15)
BUN: 24 mg/dL — ABNORMAL HIGH (ref 6–20)
CO2: 20 mmol/L — ABNORMAL LOW (ref 22–32)
Calcium: 7.4 mg/dL — ABNORMAL LOW (ref 8.9–10.3)
Chloride: 103 mmol/L (ref 98–111)
Creatinine, Ser: 2.04 mg/dL — ABNORMAL HIGH (ref 0.61–1.24)
GFR, Estimated: 44 mL/min — ABNORMAL LOW (ref >60)
Glucose, Bld: 303 mg/dL — ABNORMAL HIGH (ref 70–99)
Potassium: 4.1 mmol/L (ref 3.5–5.1)
Sodium: 135 mmol/L (ref 135–145)

## 2022-06-30 LAB — COMPREHENSIVE METABOLIC PANEL
ALT: 26 U/L (ref 0–44)
AST: 27 U/L (ref 15–41)
Albumin: 2.6 g/dL — ABNORMAL LOW (ref 3.5–5.0)
Alkaline Phosphatase: 102 U/L (ref 38–126)
Anion gap: 16 — ABNORMAL HIGH (ref 5–15)
BUN: 22 mg/dL — ABNORMAL HIGH (ref 6–20)
CO2: 18 mmol/L — ABNORMAL LOW (ref 22–32)
Calcium: 7.8 mg/dL — ABNORMAL LOW (ref 8.9–10.3)
Chloride: 100 mmol/L (ref 98–111)
Creatinine, Ser: 1.91 mg/dL — ABNORMAL HIGH (ref 0.61–1.24)
GFR, Estimated: 48 mL/min — ABNORMAL LOW (ref >60)
Glucose, Bld: 258 mg/dL — ABNORMAL HIGH (ref 70–99)
Potassium: 4.1 mmol/L (ref 3.5–5.1)
Sodium: 134 mmol/L — ABNORMAL LOW (ref 135–145)
Total Bilirubin: 1.3 mg/dL — ABNORMAL HIGH (ref 0.3–1.2)
Total Protein: 6 g/dL — ABNORMAL LOW (ref 6.5–8.1)

## 2022-06-30 LAB — C-REACTIVE PROTEIN: CRP: 28.9 mg/dL — ABNORMAL HIGH (ref <1.0)

## 2022-06-30 LAB — AEROBIC/ANAEROBIC CULTURE W GRAM STAIN (SURGICAL/DEEP WOUND)

## 2022-06-30 LAB — MRSA NEXT GEN BY PCR, NASAL: MRSA by PCR Next Gen: NOT DETECTED

## 2022-06-30 SURGERY — INCISION AND DRAINAGE, ABSCESS, PERIRECTAL
Anesthesia: General

## 2022-06-30 MED ORDER — VANCOMYCIN HCL 1750 MG/350ML IV SOLN
1750.0000 mg | INTRAVENOUS | Status: DC
  Administered 2022-07-01: 1750 mg via INTRAVENOUS
  Filled 2022-06-30: qty 350

## 2022-06-30 MED ORDER — VANCOMYCIN HCL 750 MG/150ML IV SOLN
750.0000 mg | Freq: Two times a day (BID) | INTRAVENOUS | Status: DC
  Filled 2022-06-30: qty 150

## 2022-06-30 MED ORDER — LIDOCAINE 2% (20 MG/ML) 5 ML SYRINGE
INTRAMUSCULAR | Status: DC | PRN
  Administered 2022-06-30: 100 mg via INTRAVENOUS

## 2022-06-30 MED ORDER — SUGAMMADEX SODIUM 200 MG/2ML IV SOLN
INTRAVENOUS | Status: DC | PRN
  Administered 2022-06-30: 200 mg via INTRAVENOUS

## 2022-06-30 MED ORDER — SODIUM CHLORIDE 0.9 % IV SOLN
2.0000 g | Freq: Three times a day (TID) | INTRAVENOUS | Status: DC
  Administered 2022-06-30: 2 g via INTRAVENOUS
  Filled 2022-06-30: qty 12.5

## 2022-06-30 MED ORDER — PROPOFOL 10 MG/ML IV BOLUS
INTRAVENOUS | Status: AC
  Filled 2022-06-30: qty 20

## 2022-06-30 MED ORDER — ROCURONIUM BROMIDE 10 MG/ML (PF) SYRINGE
PREFILLED_SYRINGE | INTRAVENOUS | Status: AC
  Filled 2022-06-30: qty 10

## 2022-06-30 MED ORDER — FENTANYL CITRATE (PF) 100 MCG/2ML IJ SOLN
INTRAMUSCULAR | Status: DC | PRN
  Administered 2022-06-30 (×2): 50 ug via INTRAVENOUS

## 2022-06-30 MED ORDER — ONDANSETRON HCL 4 MG/2ML IJ SOLN
INTRAMUSCULAR | Status: AC
  Filled 2022-06-30: qty 2

## 2022-06-30 MED ORDER — ONDANSETRON HCL 4 MG PO TABS
4.0000 mg | ORAL_TABLET | Freq: Four times a day (QID) | ORAL | Status: DC | PRN
  Administered 2022-07-05: 4 mg via ORAL
  Filled 2022-06-30: qty 1

## 2022-06-30 MED ORDER — SUCCINYLCHOLINE CHLORIDE 200 MG/10ML IV SOSY
PREFILLED_SYRINGE | INTRAVENOUS | Status: AC
  Filled 2022-06-30: qty 10

## 2022-06-30 MED ORDER — FENTANYL CITRATE (PF) 100 MCG/2ML IJ SOLN
INTRAMUSCULAR | Status: AC
  Filled 2022-06-30: qty 2

## 2022-06-30 MED ORDER — ONDANSETRON HCL 4 MG/2ML IJ SOLN
4.0000 mg | Freq: Four times a day (QID) | INTRAMUSCULAR | Status: DC | PRN
  Administered 2022-06-30 – 2022-07-08 (×6): 4 mg via INTRAVENOUS
  Filled 2022-06-30 (×6): qty 2

## 2022-06-30 MED ORDER — INSULIN ASPART 100 UNIT/ML IJ SOLN
0.0000 [IU] | Freq: Every day | INTRAMUSCULAR | Status: DC
  Administered 2022-06-30: 2 [IU] via SUBCUTANEOUS

## 2022-06-30 MED ORDER — INSULIN ASPART 100 UNIT/ML IJ SOLN: 4.0000 [IU] | Freq: Three times a day (TID) | INTRAMUSCULAR | Status: DC

## 2022-06-30 MED ORDER — ROCURONIUM BROMIDE 10 MG/ML (PF) SYRINGE
PREFILLED_SYRINGE | INTRAVENOUS | Status: DC | PRN
  Administered 2022-06-30: 20 mg via INTRAVENOUS

## 2022-06-30 MED ORDER — INSULIN ASPART 100 UNIT/ML IJ SOLN
0.0000 [IU] | Freq: Three times a day (TID) | INTRAMUSCULAR | Status: DC
  Administered 2022-07-01: 3 [IU] via SUBCUTANEOUS
  Administered 2022-07-01: 7 [IU] via SUBCUTANEOUS

## 2022-06-30 MED ORDER — OXYCODONE HCL 5 MG PO TABS: 5.0000 mg | ORAL_TABLET | ORAL | Status: DC | PRN

## 2022-06-30 MED ORDER — PROCHLORPERAZINE EDISYLATE 10 MG/2ML IJ SOLN
10.0000 mg | INTRAMUSCULAR | Status: DC | PRN
  Administered 2022-06-30 – 2022-07-06 (×6): 10 mg via INTRAVENOUS
  Filled 2022-06-30 (×7): qty 2

## 2022-06-30 MED ORDER — FENTANYL CITRATE PF 50 MCG/ML IJ SOSY
100.0000 ug | PREFILLED_SYRINGE | Freq: Once | INTRAMUSCULAR | Status: AC
  Administered 2022-06-30: 100 ug via INTRAVENOUS
  Filled 2022-06-30: qty 2

## 2022-06-30 MED ORDER — PROPOFOL 10 MG/ML IV BOLUS
INTRAVENOUS | Status: DC | PRN
  Administered 2022-06-30 (×2): 200 mg via INTRAVENOUS

## 2022-06-30 MED ORDER — SODIUM CHLORIDE 0.9 % IV BOLUS
2000.0000 mL | Freq: Once | INTRAVENOUS | Status: AC
  Administered 2022-06-30: 2000 mL via INTRAVENOUS

## 2022-06-30 MED ORDER — MIDAZOLAM HCL 2 MG/2ML IJ SOLN
INTRAMUSCULAR | Status: AC
  Filled 2022-06-30: qty 2

## 2022-06-30 MED ORDER — SODIUM CHLORIDE 0.9% FLUSH
3.0000 mL | Freq: Two times a day (BID) | INTRAVENOUS | Status: DC
  Administered 2022-06-30 – 2022-07-10 (×14): 3 mL via INTRAVENOUS

## 2022-06-30 MED ORDER — BUPIVACAINE HCL (PF) 0.25 % IJ SOLN
INTRAMUSCULAR | Status: DC | PRN
  Administered 2022-06-30: 20 mL

## 2022-06-30 MED ORDER — MIDAZOLAM HCL 5 MG/5ML IJ SOLN
INTRAMUSCULAR | Status: DC | PRN
  Administered 2022-06-30: 2 mg via INTRAVENOUS

## 2022-06-30 MED ORDER — DEXMEDETOMIDINE HCL IN NACL 80 MCG/20ML IV SOLN
INTRAVENOUS | Status: AC
  Filled 2022-06-30: qty 20

## 2022-06-30 MED ORDER — OXYCODONE HCL 5 MG/5ML PO SOLN: 5.0000 mg | Freq: Once | ORAL | Status: DC | PRN

## 2022-06-30 MED ORDER — ONDANSETRON HCL 4 MG/2ML IJ SOLN: 4.0000 mg | Freq: Once | INTRAMUSCULAR | Status: DC | PRN

## 2022-06-30 MED ORDER — ACETAMINOPHEN 325 MG PO TABS
650.0000 mg | ORAL_TABLET | Freq: Once | ORAL | Status: AC
  Administered 2022-06-30: 650 mg via ORAL
  Filled 2022-06-30: qty 2

## 2022-06-30 MED ORDER — SUCCINYLCHOLINE CHLORIDE 200 MG/10ML IV SOSY
PREFILLED_SYRINGE | INTRAVENOUS | Status: DC | PRN
  Administered 2022-06-30: 100 mg via INTRAVENOUS

## 2022-06-30 MED ORDER — ONDANSETRON HCL 4 MG/2ML IJ SOLN
4.0000 mg | Freq: Once | INTRAMUSCULAR | Status: AC
  Administered 2022-06-30: 4 mg via INTRAVENOUS
  Filled 2022-06-30: qty 2

## 2022-06-30 MED ORDER — VANCOMYCIN HCL 2000 MG/400ML IV SOLN
2000.0000 mg | INTRAVENOUS | Status: AC
  Administered 2022-06-30: 2000 mg via INTRAVENOUS
  Filled 2022-06-30: qty 400

## 2022-06-30 MED ORDER — INSULIN DETEMIR 100 UNIT/ML ~~LOC~~ SOLN
25.0000 [IU] | Freq: Two times a day (BID) | SUBCUTANEOUS | Status: DC
  Administered 2022-06-30 – 2022-07-02 (×4): 25 [IU] via SUBCUTANEOUS
  Filled 2022-06-30 (×5): qty 0.25

## 2022-06-30 MED ORDER — ENOXAPARIN SODIUM 40 MG/0.4ML IJ SOSY
40.0000 mg | PREFILLED_SYRINGE | INTRAMUSCULAR | Status: DC
  Administered 2022-06-30 – 2022-07-06 (×7): 40 mg via SUBCUTANEOUS
  Filled 2022-06-30 (×7): qty 0.4

## 2022-06-30 MED ORDER — 0.9 % SODIUM CHLORIDE (POUR BTL) OPTIME
TOPICAL | Status: DC | PRN
  Administered 2022-06-30: 1000 mL

## 2022-06-30 MED ORDER — HYDROMORPHONE HCL 1 MG/ML IJ SOLN
0.2500 mg | INTRAMUSCULAR | Status: DC | PRN
  Administered 2022-06-30: 0.5 mg via INTRAVENOUS

## 2022-06-30 MED ORDER — ACETAMINOPHEN 325 MG PO TABS: 650.0000 mg | ORAL_TABLET | Freq: Once | ORAL | Status: DC

## 2022-06-30 MED ORDER — OXYCODONE HCL 5 MG PO TABS
5.0000 mg | ORAL_TABLET | ORAL | Status: DC | PRN
  Administered 2022-07-01 – 2022-07-02 (×6): 10 mg via ORAL
  Filled 2022-06-30 (×7): qty 2

## 2022-06-30 MED ORDER — LIDOCAINE HCL (PF) 2 % IJ SOLN
INTRAMUSCULAR | Status: AC
  Filled 2022-06-30: qty 5

## 2022-06-30 MED ORDER — SODIUM CHLORIDE 0.9 % IV BOLUS
1000.0000 mL | Freq: Once | INTRAVENOUS | Status: AC
  Administered 2022-06-30: 1000 mL via INTRAVENOUS

## 2022-06-30 MED ORDER — LACTATED RINGERS IV SOLN: INTRAVENOUS | Status: AC

## 2022-06-30 MED ORDER — HYDROMORPHONE HCL 1 MG/ML IJ SOLN
INTRAMUSCULAR | Status: AC
  Administered 2022-06-30: 0.5 mg via INTRAVENOUS
  Filled 2022-06-30: qty 1

## 2022-06-30 MED ORDER — SODIUM CHLORIDE 0.9 % IV SOLN
2.0000 g | Freq: Two times a day (BID) | INTRAVENOUS | Status: DC
  Administered 2022-06-30 – 2022-07-01 (×3): 2 g via INTRAVENOUS
  Filled 2022-06-30 (×3): qty 12.5

## 2022-06-30 MED ORDER — INSULIN ASPART 100 UNIT/ML IJ SOLN
0.0000 [IU] | INTRAMUSCULAR | Status: DC
  Administered 2022-06-30: 3 [IU] via SUBCUTANEOUS
  Administered 2022-06-30: 5 [IU] via SUBCUTANEOUS

## 2022-06-30 MED ORDER — ACETAMINOPHEN 650 MG RE SUPP: 650.0000 mg | Freq: Four times a day (QID) | RECTAL | Status: DC | PRN

## 2022-06-30 MED ORDER — DEXMEDETOMIDINE HCL IN NACL 200 MCG/50ML IV SOLN
INTRAVENOUS | Status: DC | PRN
  Administered 2022-06-30: 8 ug via INTRAVENOUS

## 2022-06-30 MED ORDER — MORPHINE SULFATE (PF) 2 MG/ML IV SOLN
1.0000 mg | INTRAVENOUS | Status: DC | PRN
  Administered 2022-06-30: 1 mg via INTRAVENOUS
  Filled 2022-06-30: qty 1

## 2022-06-30 MED ORDER — KETOROLAC TROMETHAMINE 30 MG/ML IJ SOLN
INTRAMUSCULAR | Status: AC
  Filled 2022-06-30: qty 1

## 2022-06-30 MED ORDER — KETOROLAC TROMETHAMINE 30 MG/ML IJ SOLN
30.0000 mg | Freq: Once | INTRAMUSCULAR | Status: AC | PRN
  Administered 2022-06-30: 30 mg via INTRAVENOUS

## 2022-06-30 MED ORDER — INSULIN ASPART 100 UNIT/ML IJ SOLN
INTRAMUSCULAR | Status: AC
  Administered 2022-06-30: 5 [IU] via SUBCUTANEOUS
  Filled 2022-06-30: qty 1

## 2022-06-30 MED ORDER — ORAL CARE MOUTH RINSE: 15.0000 mL | OROMUCOSAL | Status: DC | PRN

## 2022-06-30 MED ORDER — LACTATED RINGERS IV BOLUS
1000.0000 mL | Freq: Once | INTRAVENOUS | Status: AC
  Administered 2022-06-30: 1000 mL via INTRAVENOUS

## 2022-06-30 MED ORDER — INSULIN DETEMIR 100 UNIT/ML ~~LOC~~ SOLN
20.0000 [IU] | Freq: Two times a day (BID) | SUBCUTANEOUS | Status: DC
  Administered 2022-06-30: 20 [IU] via SUBCUTANEOUS
  Filled 2022-06-30 (×2): qty 0.2

## 2022-06-30 MED ORDER — MORPHINE SULFATE (PF) 2 MG/ML IV SOLN: 2.0000 mg | INTRAVENOUS | Status: DC | PRN

## 2022-06-30 MED ORDER — ACETAMINOPHEN 325 MG PO TABS: 650.0000 mg | ORAL_TABLET | Freq: Four times a day (QID) | ORAL | Status: DC | PRN

## 2022-06-30 MED ORDER — OXYCODONE HCL 5 MG PO TABS: 5.0000 mg | ORAL_TABLET | Freq: Once | ORAL | Status: DC | PRN

## 2022-06-30 MED ORDER — ONDANSETRON HCL 4 MG/2ML IJ SOLN
INTRAMUSCULAR | Status: DC | PRN
  Administered 2022-06-30: 4 mg via INTRAVENOUS

## 2022-06-30 MED ORDER — ACETAMINOPHEN 500 MG PO TABS
1000.0000 mg | ORAL_TABLET | Freq: Four times a day (QID) | ORAL | Status: DC
  Administered 2022-06-30 – 2022-07-02 (×7): 1000 mg via ORAL
  Filled 2022-06-30 (×7): qty 2

## 2022-06-30 MED ORDER — BUPIVACAINE HCL 0.25 % IJ SOLN
INTRAMUSCULAR | Status: AC
  Filled 2022-06-30: qty 1

## 2022-06-30 MED ORDER — INSULIN DETEMIR 100 UNIT/ML ~~LOC~~ SOLN
30.0000 [IU] | Freq: Two times a day (BID) | SUBCUTANEOUS | Status: DC
  Filled 2022-06-30: qty 0.3

## 2022-06-30 MED ORDER — MORPHINE SULFATE (PF) 2 MG/ML IV SOLN
1.0000 mg | INTRAVENOUS | Status: DC | PRN
  Administered 2022-07-02: 2 mg via INTRAVENOUS
  Filled 2022-06-30: qty 1

## 2022-06-30 SURGICAL SUPPLY — 34 items
BAG COUNTER SPONGE SURGICOUNT (BAG) IMPLANT
BAG SPNG CNTER NS LX DISP (BAG)
BLADE HEX COATED 2.75 (ELECTRODE) ×1 IMPLANT
BLADE SURG 15 STRL LF DISP TIS (BLADE) ×1 IMPLANT
BLADE SURG 15 STRL SS (BLADE) ×1
COVER SURGICAL LIGHT HANDLE (MISCELLANEOUS) ×1 IMPLANT
ELECT REM PT RETURN 15FT ADLT (MISCELLANEOUS) ×1 IMPLANT
GAUZE 4X4 16PLY ~~LOC~~+RFID DBL (SPONGE) ×1 IMPLANT
GAUZE PACKING IODOFORM 1/2INX (GAUZE/BANDAGES/DRESSINGS) IMPLANT
GAUZE PACKING IODOFORM 1/4X15 (PACKING) IMPLANT
GAUZE PAD ABD 8X10 STRL (GAUZE/BANDAGES/DRESSINGS) IMPLANT
GAUZE SPONGE 4X4 12PLY STRL (GAUZE/BANDAGES/DRESSINGS) ×1 IMPLANT
GLOVE BIO SURGEON STRL SZ7 (GLOVE) ×2 IMPLANT
GLOVE BIOGEL PI IND STRL 7.0 (GLOVE) ×1 IMPLANT
GLOVE BIOGEL PI IND STRL 7.5 (GLOVE) ×2 IMPLANT
GOWN STRL REUS W/ TWL LRG LVL3 (GOWN DISPOSABLE) ×2 IMPLANT
GOWN STRL REUS W/ TWL XL LVL3 (GOWN DISPOSABLE) ×1 IMPLANT
GOWN STRL REUS W/TWL LRG LVL3 (GOWN DISPOSABLE) ×2
GOWN STRL REUS W/TWL XL LVL3 (GOWN DISPOSABLE) ×1
KIT BASIN OR (CUSTOM PROCEDURE TRAY) ×1 IMPLANT
KIT TURNOVER KIT A (KITS) IMPLANT
NDL HYPO 25X1 1.5 SAFETY (NEEDLE) IMPLANT
NEEDLE HYPO 25X1 1.5 SAFETY (NEEDLE) ×1 IMPLANT
PACK LITHOTOMY IV (CUSTOM PROCEDURE TRAY) ×1 IMPLANT
PENCIL SMOKE EVACUATOR (MISCELLANEOUS) IMPLANT
SOL SCRUB PVP POV-IOD 4OZ 7.5% (MISCELLANEOUS) ×1
SOLUTION SCRB POV-IOD 4OZ 7.5% (MISCELLANEOUS) ×1 IMPLANT
SURGILUBE 2OZ TUBE FLIPTOP (MISCELLANEOUS) ×1 IMPLANT
SUT CHROMIC 2 0 SH (SUTURE) IMPLANT
SUT CHROMIC 3 0 SH 27 (SUTURE) IMPLANT
SWAB COLLECTION DEVICE MRSA (MISCELLANEOUS) IMPLANT
SYR CONTROL 10ML LL (SYRINGE) IMPLANT
TOWEL OR 17X26 10 PK STRL BLUE (TOWEL DISPOSABLE) ×1 IMPLANT
UNDERPAD 30X36 HEAVY ABSORB (UNDERPADS AND DIAPERS) IMPLANT

## 2022-06-30 NOTE — Transfer of Care (Signed)
Immediate Anesthesia Transfer of Care Note  Patient: [name]  Procedure(s) Performed: IRRIGATION AND DEBRIDEMENT PERIRECTAL ABSCESS  Patient Location: PACU  Anesthesia Type:General  Level of Consciousness: sedated  Airway & Oxygen Therapy: Patient Spontanous Breathing and Patient connected to face mask oxygen  Post-op Assessment: Report given to RN and Post -op Vital signs reviewed and stable  Post vital signs: Reviewed and stable  Last Vitals:  Vitals Value Taken Time  BP 110/70 06/30/22 1415  Temp 37.7 C 06/30/22 1414  Pulse 122 06/30/22 1416  Resp 43 06/30/22 1416  SpO2 96 % 06/30/22 1416  Vitals shown include unvalidated device data.  Last Pain:  Vitals:   06/30/22 1414  TempSrc: Oral  PainSc:       Patients Stated Pain Goal: 4 (06/30/22 0946)  Complications: No notable events documented.

## 2022-06-30 NOTE — Anesthesia Preprocedure Evaluation (Addendum)
Anesthesia Evaluation  Patient identified by MRN, date of birth, ID band Patient awake    Reviewed: Allergy & Precautions, NPO status , Patient's Chart, lab work & pertinent test results  Airway Mallampati: II  TM Distance: >3 FB Neck ROM: Full    Dental no notable dental hx. (+) Teeth Intact, Dental Advisory Given   Pulmonary neg pulmonary ROS   Pulmonary exam normal breath sounds clear to auscultation       Cardiovascular negative cardio ROS Normal cardiovascular exam Rhythm:Regular Rate:Normal     Neuro/Psych negative neurological ROS  negative psych ROS   GI/Hepatic negative GI ROS, Neg liver ROS,,,  Endo/Other  diabetes, Type 1    Renal/GU ARF and Renal InsufficiencyRenal diseaseLab Results      Component                Value               Date                      CREATININE               1.91 (H)            06/30/2022                BUN                      22 (H)              06/30/2022                NA                       134 (L)             06/30/2022                K                        4.1                 06/30/2022                    Musculoskeletal   Abdominal   Peds  Hematology negative hematology ROS (+) Lab Results      Component                Value               Date                      WBC                      21.1 (H)            06/30/2022                HGB                      11.2 (L)            06/30/2022                HCT                      35.6 (L)  06/30/2022                MCV                      88.8                06/30/2022                PLT                      240                 06/30/2022              Anesthesia Other Findings All: Amoxiciallin, Levaquin  Reproductive/Obstetrics                             Anesthesia Physical Anesthesia Plan  ASA: 3 and emergent  Anesthesia Plan: General   Post-op Pain Management:     Induction: Intravenous, Rapid sequence and Cricoid pressure planned  PONV Risk Score and Plan: 3 and Treatment may vary due to age or medical condition, Midazolam, Dexamethasone and Metaclopromide  Airway Management Planned: Oral ETT  Additional Equipment: None  Intra-op Plan:   Post-operative Plan: Extubation in OR  Informed Consent: I have reviewed the patients History and Physical, chart, labs and discussed the procedure including the risks, benefits and alternatives for the proposed anesthesia with the patient or authorized representative who has indicated his/her understanding and acceptance.     Dental advisory given  Plan Discussed with: CRNA and Anesthesiologist  Anesthesia Plan Comments:         Anesthesia Quick Evaluation

## 2022-06-30 NOTE — Consult Note (Signed)
[name] [birth year]  [id number].    Requesting MD: Junious Silk, NP Chief Complaint/Reason for Consult: perirectal abscess  HPI:  This is a pleasant 32 yo male with a history of Type I Diabetes with a last hgba1c in the 8s who has started having perirectal pain last Wednesday.  He denies any drainage.  He started having some fevers, up to 102.9 at [facility].  His symptoms worsened and his CBGs went over 400.  This area was I&D'd in the ED with minimal findings.  He was admitted for IV abx therapy and a CT scanner was unavailable at [facility] so he was transferred down to [facility] hospital for further management.  We have been asked to see for further evaluation.  ROS: ROS: Please see HPI  Family History  Problem Relation Age of Onset   Diabetes Other     Past Medical History:  Diagnosis Date   Diabetes mellitus    insulin pump    Past Surgical History:  Procedure Laterality Date   INCISION AND DRAINAGE ABSCESS     5-6 yrs ago, buttock   KNEE SURGERY      Social History:  reports that he has never smoked. He has never used smokeless tobacco. He reports current alcohol use. He reports that he does not use drugs.  Allergies:  Allergies  Allergen Reactions   Amoxicillin Rash   Levaquin [Levofloxacin] Rash    Medications Prior to Admission  Medication Sig Dispense Refill   clonazePAM (KLONOPIN) 0.5 MG tablet Take 0.5 mg by mouth as needed for anxiety.     escitalopram (LEXAPRO) 5 MG tablet Take 1 tablet by mouth at bedtime.     ibuprofen (ADVIL) 200 MG tablet Take 400 mg by mouth every 6 (six) hours as needed for moderate pain.     Insulin Glargine (TOUJEO SOLOSTAR [place]) Inject 60 Units into the skin at bedtime.     insulin lispro (HUMALOG) 100 UNIT/ML KwikPen Inject 10-20 Units into the skin in the morning, at noon, in the evening, and at bedtime. Before meals sliding scale       Physical Exam: Blood pressure (!) 103/54, pulse 60, temperature 99.7 F (37.6 C), temperature  source Oral, resp. rate 17, height 5\' 7"  (1.702 m), weight 100.7 kg, SpO2 (!) 83 %. General: pleasant, WD, WN white male who is laying in bed in NAD HEENT: head is normocephalic, atraumatic.  Sclera are noninjected.  PERRL.  Ears and nose without any masses or lesions.  Mouth is pink and moist Heart: regular, rate, and rhythm.   Lungs: CTAB, no wheezes, rhonchi, or rales noted.  Respiratory effort nonlabored Abd: soft, NT, ND, +BS, no masses, hernias, or organomegaly Skin: left perirectal erythema and induration.  Small previous I&D site with no active drainage noted.  Tender to palpation.  This does track anteriorly, but no involvement in the scrotum. Psych: A&Ox3 with an appropriate affect.   Results for orders placed or performed during the hospital encounter of 06/29/22 (from the past 48 hour(s))  Blood Culture (routine x 2)     Status: None (Preliminary result)   Collection Time: 06/29/22  3:46 PM   Specimen: BLOOD RIGHT HAND  Result Value Ref Range   Specimen Description      BLOOD RIGHT HAND BOTTLES DRAWN AEROBIC AND ANAEROBIC   Special Requests      Blood Culture adequate volume Performed at [facility], [address] [place], [place] [address]    Culture PENDING  Report Status PENDING   CBG monitoring, ED     Status: Abnormal   Collection Time: 06/29/22  3:48 PM  Result Value Ref Range   Glucose-Capillary 104 (H) 70 - 99 mg/dL    Comment: Glucose reference range applies only to samples taken after fasting for at least 8 hours.  Lactic acid, plasma     Status: Abnormal   Collection Time: 06/29/22  3:55 PM  Result Value Ref Range   Lactic Acid, Venous 2.3 (HH) 0.5 - 1.9 mmol/L    Comment: CRITICAL RESULT CALLED TO, READ BACK BY AND VERIFIED WITH WELCH, K AT 1636 ON 06/29/22 BY SMN. Performed at [facility], [address] [place], [place] [address]   Comprehensive metabolic panel     Status: Abnormal   Collection Time: 06/29/22  3:55 PM  Result Value Ref Range    Sodium 134 (L) 135 - 145 mmol/L   Potassium 3.2 (L) 3.5 - 5.1 mmol/L   Chloride 101 98 - 111 mmol/L   CO2 23 22 - 32 mmol/L   Glucose, Bld 109 (H) 70 - 99 mg/dL    Comment: Glucose reference range applies only to samples taken after fasting for at least 8 hours.   BUN 21 (H) 6 - 20 mg/dL   Creatinine, Ser 8.29 (H) 0.61 - 1.24 mg/dL   Calcium 8.6 (L) 8.9 - 10.3 mg/dL   Total Protein 7.3 6.5 - 8.1 g/dL   Albumin 3.3 (L) 3.5 - 5.0 g/dL   AST 21 15 - 41 U/L   ALT 20 0 - 44 U/L   Alkaline Phosphatase 105 38 - 126 U/L   Total Bilirubin 1.0 0.3 - 1.2 mg/dL   GFR, Estimated >56 >21 mL/min    Comment: (NOTE) Calculated using the CKD-EPI Creatinine Equation (2021)    Anion gap 10 5 - 15    Comment: Performed at [facility] [address] [place], [place] [address]  CBC with Differential     Status: Abnormal   Collection Time: 06/29/22  3:55 PM  Result Value Ref Range   WBC 19.6 (H) 4.0 - 10.5 K/uL   RBC 4.87 4.22 - 5.81 MIL/uL   Hemoglobin 13.9 13.0 - 17.0 g/dL   HCT 78.4 69.6 - 29.5 %   MCV 85.8 80.0 - 100.0 fL   MCH 28.5 26.0 - 34.0 pg   MCHC 33.3 30.0 - 36.0 g/dL   RDW 28.4 13.2 - 44.0 %   Platelets 308 150 - 400 K/uL   nRBC 0.0 0.0 - 0.2 %   Neutrophils Relative % 84 %   Neutro Abs 16.9 (H) 1.7 - 7.7 K/uL   Lymphocytes Relative 7 %   Lymphs Abs 1.3 0.7 - 4.0 K/uL   Monocytes Relative 6 %   Monocytes Absolute 1.1 (H) 0.1 - 1.0 K/uL   Eosinophils Relative 1 %   Eosinophils Absolute 0.1 0.0 - 0.5 K/uL   Basophils Relative 1 %   Basophils Absolute 0.1 0.0 - 0.1 K/uL   Immature Granulocytes 1 %   Abs Immature Granulocytes 0.13 (H) 0.00 - 0.07 K/uL    Comment: Performed at [facility], [address], [place], [place] [address]  Blood Culture (routine x 2)     Status: None (Preliminary result)   Collection Time: 06/29/22  3:55 PM   Specimen: Right Antecubital; Blood  Result Value Ref Range   Specimen Description      RIGHT ANTECUBITAL BOTTLES DRAWN AEROBIC AND ANAEROBIC    Special  Requests      Blood Culture results may not be optimal due to an excessive volume of blood received in culture bottles Performed at [facility], [address], [place], [place] [address]    Culture PENDING    Report Status PENDING   Lipase, blood     Status: None   Collection Time: 06/29/22  3:55 PM  Result Value Ref Range   Lipase 30 11 - 51 U/L    Comment: Performed at [facility], [address], [place], [place] [address]  Urinalysis, w/ Reflex to Culture (Infection Suspected) -Urine, Clean Catch     Status: Abnormal   Collection Time: 06/29/22  4:30 PM  Result Value Ref Range   Specimen Source URINE, CLEAN CATCH    Color, Urine YELLOW YELLOW   APPearance CLEAR CLEAR   Specific Gravity, Urine 1.018 1.005 - 1.030   pH 5.0 5.0 - 8.0   Glucose, UA >=500 (A) NEGATIVE mg/dL   Hgb urine dipstick MODERATE (A) NEGATIVE   Bilirubin Urine NEGATIVE NEGATIVE   Ketones, ur NEGATIVE NEGATIVE mg/dL   Protein, ur 098 (A) NEGATIVE mg/dL   Nitrite NEGATIVE NEGATIVE   Leukocytes,Ua NEGATIVE NEGATIVE   RBC / HPF 0-5 0 - 5 RBC/hpf   WBC, UA 0-5 0 - 5 WBC/hpf    Comment:        Reflex urine culture not performed if WBC <=10, OR if Squamous epithelial cells >5. If Squamous epithelial cells >5 suggest recollection.    Bacteria, UA NONE SEEN NONE SEEN   Squamous Epithelial / HPF 0-5 0 - 5 /HPF    Comment: Performed at [facility], [address] [place], [place] [address]  Aerobic Culture w Gram Stain (superficial specimen)     Status: None (Preliminary result)   Collection Time: 06/29/22  5:00 PM   Specimen: Abscess  Result Value Ref Range   Specimen Description      ABSCESS Performed at [facility], [address] [place], [place] [address]    Special Requests      NONE Performed at [facility], [address], [place], [place] [address]    Gram Stain      RARE WBC PRESENT, PREDOMINANTLY PMN NO ORGANISMS SEEN    Culture      NO GROWTH < 12 HOURS Performed at [facility], [address], [place], [place] [address]    Report Status PENDING   Lactic acid, plasma     Status: Abnormal   Collection Time: 06/29/22  5:16 PM  Result Value Ref Range   Lactic Acid, Venous 2.7 (HH) 0.5 - 1.9 mmol/L    Comment: CRITICAL VALUE NOTED. VALUE IS CONSISTENT WITH PREVIOUSLY CRITICAL REPORTED/CALLED VALUE Performed at [facility], [address] [place], [place] [address]   Blood gas, venous (at [facility] and [facility])     Status: Abnormal   Collection Time: 06/29/22  5:16 PM  Result Value Ref Range   pH, Ven 7.39 7.25 - 7.43   pCO2, Ven 45 44 - 60 mmHg   pO2, Ven <31 (LL) 32 - 45 mmHg    Comment: CRITICAL RESULT CALLED TO, READ BACK BY AND VERIFIED WITH: LONG L @ 1728 ON [id number] BY HENDERSON L    Bicarbonate 27.3 20.0 - 28.0 mmol/L   Acid-Base Excess 2.0 0.0 - 2.0 mmol/L   O2 Saturation 40.9 %   Patient temperature 37.6    Collection site BLOOD RIGHT HAND    Drawn by [id number]  Comment: Performed at [facility], [address], [place], [place] [address]  Lactic acid, plasma     Status: None   Collection Time: 06/29/22  7:06 PM  Result Value Ref Range   Lactic Acid, Venous 1.0 0.5 - 1.9 mmol/L    Comment: Performed at [facility], [address], [place], [place] [address]  Lactic acid, plasma     Status: Abnormal   Collection Time: 06/29/22  8:29 PM  Result Value Ref Range   Lactic Acid, Venous 2.3 (HH) 0.5 - 1.9 mmol/L    Comment: CRITICAL RESULT CALLED TO, READ BACK BY AND VERIFIED WITH WELCH, K AT 2059 ON 06/29/22  BY SMN. Performed at [facility] [address], [place], [place] [address]   APTT     Status: None   Collection Time: 06/29/22  8:29 PM  Result Value Ref Range   aPTT 28 24 - 36 seconds    Comment: Performed at [facility], [address] [place], [place] [address]  Protime-INR     Status: None   Collection Time: 06/29/22  8:29 PM  Result Value Ref Range   Prothrombin Time 14.0 11.4 - 15.2 seconds   INR 1.1 0.8 - 1.2    Comment: (NOTE) INR  goal varies based on device and disease states. Performed at [facility] [address], [place], [place] [address]   Glucose, capillary     Status: Abnormal   Collection Time: 06/30/22  8:06 AM  Result Value Ref Range   Glucose-Capillary 260 (H) 70 - 99 mg/dL    Comment: Glucose reference range applies only to samples taken after fasting for at least 8 hours.  MRSA Next Gen by PCR, Nasal     Status: None   Collection Time: 06/30/22  8:37 AM   Specimen: Nasal Mucosa; Nasal Swab  Result Value Ref Range   MRSA by PCR Next Gen NOT DETECTED NOT DETECTED    Comment: (NOTE) The GeneXpert MRSA Assay (FDA approved for NASAL specimens only), is one component of a comprehensive MRSA colonization surveillance program. It is not intended to diagnose MRSA infection nor to guide or monitor treatment for MRSA infections. Test performance is not FDA approved in patients less than 34 years old. Performed at [facility], [address], [place], [place] [address]   Comprehensive metabolic panel     Status: Abnormal   Collection Time: 06/30/22  8:42 AM  Result Value Ref Range   Sodium 134 (L) 135 - 145 mmol/L   Potassium 4.1 3.5 - 5.1 mmol/L   Chloride 100 98 - 111 mmol/L   CO2 18 (L) 22 - 32 mmol/L   Glucose, Bld 258 (H) 70 - 99 mg/dL    Comment: Glucose reference range applies only to samples taken after fasting for at least 8 hours.   BUN 22 (H) 6 - 20 mg/dL   Creatinine, Ser 5.78 (H) 0.61 - 1.24 mg/dL   Calcium 7.8 (L) 8.9 - 10.3 mg/dL   Total Protein 6.0 (L) 6.5 - 8.1 g/dL   Albumin 2.6 (L) 3.5 - 5.0 g/dL   AST 27 15 - 41 U/L   ALT 26 0 - 44 U/L   Alkaline Phosphatase 102 38 - 126 U/L   Total Bilirubin 1.3 (H) 0.3 - 1.2 mg/dL   GFR, Estimated 48 (L) >60 mL/min    Comment: (NOTE) Calculated using the CKD-EPI Creatinine Equation (2021)    Anion gap 16 (H) 5 - 15  Comment: Performed at [facility], [address] [place], [place] [address]  CBC  with Differential     Status: Abnormal   Collection Time: 06/30/22  8:42 AM  Result Value Ref Range   WBC 21.1 (H) 4.0 - 10.5 K/uL   RBC 4.01 (L) 4.22 - 5.81 MIL/uL   Hemoglobin 11.2 (L) 13.0 - 17.0 g/dL   HCT 09.8 (L) 11.9 - 14.7 %   MCV 88.8 80.0 - 100.0 fL   MCH 27.9 26.0 - 34.0 pg   MCHC 31.5 30.0 - 36.0 g/dL   RDW 82.9 56.2 - 13.0 %   Platelets 240 150 - 400 K/uL   nRBC 0.0 0.0 - 0.2 %   Neutrophils Relative % 88 %   Neutro Abs 18.6 (H) 1.7 - 7.7 K/uL   Lymphocytes Relative 4 %   Lymphs Abs 0.8 0.7 - 4.0 K/uL   Monocytes Relative 7 %   Monocytes Absolute 1.5 (H) 0.1 - 1.0 K/uL   Eosinophils Relative 1 %   Eosinophils Absolute 0.2 0.0 - 0.5 K/uL   Basophils Relative 0 %   Basophils Absolute 0.0 0.0 - 0.1 K/uL   Abs Immature Granulocytes 0.00 0.00 - 0.07 K/uL    Comment: Performed at [facility], [address], [place], [place] [address]  Lactic acid, plasma     Status: None   Collection Time: 06/30/22  8:42 AM  Result Value Ref Range   Lactic Acid, Venous 1.8 0.5 - 1.9 mmol/L    Comment: Performed at [facility], [address], [place], [place] [address]   CT ABDOMEN PELVIS W CONTRAST  Result Date: 06/29/2022 CLINICAL DATA:  Sepsis EXAM: CT ABDOMEN AND PELVIS WITH CONTRAST TECHNIQUE: Multidetector CT imaging of the abdomen and pelvis was performed using the standard protocol following bolus administration of intravenous contrast. RADIATION DOSE REDUCTION: This exam was performed according to the departmental dose-optimization program which includes automated exposure control, adjustment of the mA and/or kV according to patient size and/or use of iterative reconstruction technique. CONTRAST:  OMNIPAQUE IOHEXOL 300 MG/ML  SOLN COMPARISON:  04/13/2013 FINDINGS: Lower chest: No pleural effusion. No pneumothorax. Patchy atelectasis or infiltrate posteriorly at the left lung base. Hepatobiliary: No focal liver abnormality is seen. No  gallstones, gallbladder wall thickening, or biliary dilatation. Pancreas: Unremarkable. No pancreatic ductal dilatation or surrounding inflammatory changes. Spleen: Normal in size without focal abnormality. Adrenals/Urinary Tract: No adrenal mass. Symmetric renal enhancement without focal lesion, urolithiasis, or hydronephrosis. Urinary bladder is distended. Stomach/Bowel: Stomach incompletely distended, unremarkable. A few gas distended small bowel loops in the left mid abdomen, the proximal and distal loops are decompressed and unremarkable. Normal appendix. Colon is partially distended by gas and fecal material, unremarkable. Vascular/Lymphatic: No significant vascular findings are present. No enlarged abdominal or pelvic lymph nodes. Reproductive: Prostate is unremarkable. Other: No ascites.  No free air. 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 Mild vertebral compression deformities T10 and T12, stable since previous. No acute findings. IMPRESSION: 1. No acute abdominal findings. 2. Patchy atelectasis or infiltrate posteriorly at the left lung base. Electronically Signed   By: Corlis Leak M.D.   On: 06/29/2022 21:58      Assessment/Plan Perirectal abscess The patient has been seen, examined, chart, labs, vitals, and imaging personally reviewed.  The patient will need to go to the OR for incision, drainage, and possible debridement of this area.  This has been discussed at the bedside with the patient  as well as his father who is present.  They both agree to this procedure.  This has been discussed including expected outcomes of packing and eventual removal and sitz bathes, etc.  The patient agrees to proceed.   FEN - NPO/IVFs VTE - lovenox ID - Maxipime/Vanc  Type I diabetes  I reviewed hospitalist notes, last 24 h vitals and pain scores, last 48 h intake and output, last 24 h labs and trends, and last 24 h imaging results.  Letha Cape, [facility]  Surgery 06/30/2022, 10:15 AM Please see Amion for pager number during day hours 7:00am-4:30pm or 7:00am -11:30am on weekends

## 2022-06-30 NOTE — Progress Notes (Signed)
Transition of Care [facility]) - Inpatient Brief Assessment   Patient Details  Name: [name] MRN: [id number] Date of Birth: 1990-08-13  Transition of Care [facility]) CM/SW Contact:    Larrie Kass, LCSW Phone Number: 06/30/2022, 10:25 AM   Clinical Narrative:  Transition of Care Department [facility]) has reviewed patient and no TOC needs have been identified at this time. We will continue to monitor patient advancement through interdisciplinary progression rounds. If new patient transition needs arise, please place a TOC consult.   Transition of Care Asessment: Insurance and Status: Insurance coverage has been reviewed Patient has primary care physician: Yes Home environment has been reviewed: from home Prior level of function:: independent Prior/Current Home Services: No current home services Social Determinants of Health Reivew: SDOH reviewed no interventions necessary Readmission risk has been reviewed: Yes Transition of care needs: no transition of care needs at this time

## 2022-06-30 NOTE — Progress Notes (Signed)
Pharmacy Antibiotic Note  [name] is a 32 y.o. male admitted on 06/29/2022 with  perirectal abscess .  Pharmacy has been consulted for Vanco, Cefepime dosing.  Active Problem(s): Boil>>Left buttock abscess. Transferred from [facility] (ED has attempted drainage but no major pus) for CT scan. Glucose >400  Assessment: Scr has risen since vanc and cefepime were dosed requiring dosage adjustments.   Plan: Change cefepime from 2g IV q8 to 2g IV q12 Change vanc from 750mg  IV q12 to 1750mg  IV q36 Daily SCr   Height: 5\' 7"  (170.2 cm) Weight: 100.7 kg (222 lb) IBW/kg (Calculated) : 66.1  Temp (24hrs), Avg:100.8 F (38.2 C), Min:99.7 F (37.6 C), Max:102.5 F (39.2 C)  Recent Labs  Lab 06/29/22 1555 06/29/22 1716 06/29/22 1906 06/29/22 2029 06/30/22 0842 06/30/22 1130 06/30/22 1521  WBC 19.6*  --   --   --  21.1*  --   --   CREATININE 1.48*  --   --   --  1.91*  --  2.04*  LATICACIDVEN 2.3* 2.7* 1.0 2.3* 1.8 2.2*  --      Estimated Creatinine Clearance: 59.8 mL/min (A) (by C-G formula based on SCr of 2.04 mg/dL (H)).    Allergies  Allergen Reactions   Amoxicillin Rash   Levaquin [Levofloxacin] Rash    Hessie Knows, PharmD, BCPS Secure Chat if ?s 06/30/2022 5:58 PM

## 2022-06-30 NOTE — Op Note (Signed)
Preoperative diagnosis: perirectal abscess Postoperative diagnosis: same as above Procedure: Incision and drainage of perirectal abscess  Surgeon: Dr Harden Mo Anes general EBL minimal Complications none Drains none Specimens cultures to microbiology Dispo recovery stable  Indications: 32 yom with type I DM who has perirectal abscess. We discussed incision and drainage.  Procedure: After informed consent obtained patient was taken to the OR.  He was already on antibiotics.  SCDs were in place.  He was placed under general anesthesia without complication.  He was placed in low lithotomy. Timeout was performed.  I identified the area in the left perirectal area and perineum.  I made an elliptical incision and drained a lot of purulence. Cultures taken. This area extended anteriorly and this was all drained.  I do not think that was a NSTI.  I then obtained hemostasis.  I packed this with iodoform. Dressings placed.  He tolerated well.

## 2022-06-30 NOTE — Progress Notes (Signed)
IV Team to bedside per consult. Current PIV access working well. Pt very pleasant but preferred "not to be poked again" at this time, lab had just left patient's room. Sent chat to floor RN, re-consult if second access is still needed later today and patient more agreeable.

## 2022-06-30 NOTE — H&P (Addendum)
History and Physical    Patient: [name]:[id number] DOB: 20-Oct-1990 DOA: 06/29/2022 DOS: the patient was seen and examined on 06/30/2022 PCP: Roe Rutherford, NP  Patient coming from: Home  Chief Complaint:  Chief Complaint  Patient presents with   Cellulitis   HPI: [name] is a 32 y.o. male with medical history significant of diabetes mellitus on insulin, obesity with BMI 34.7.  Patient presented to the urgent care initially with complaints of evolving buttock abscess.  Because of his history of diabetes and severity and appearance of the abscess he was sent to the [facility] ED for further evaluation.  He was evaluated by EDP as well as general surgery at [facility] ED. given concerns over possible evolving soft tissue necrotizing infection, reported inability to proceed with serial debridements and no access to a functional CT scanner patient was subsequently sent to [facility] for further evaluation and treatment.  While in the ED patient did undergo an attempted I&D of this area but this was unsuccessful in regards to drainage of any possible fluids.  Upon my evaluation of the patient he had subsequently arrived to [facility] and was in his room.  He reports that on Wednesday 5/29 he developed a small abscess very close to the rectum.  He would have pain with defecation.  He has been having fevers chills and rigors as well as nausea and emesis.  He normally takes Toujeo and sliding scale insulin for his diabetes and typically his sugars are in the 150 range but since onset of symptoms his sugars have been as high as 438.   Review of Systems: As mentioned in the history of present illness. All other systems reviewed and are negative. Past Medical History:  Diagnosis Date   Diabetes mellitus    insulin pump   History reviewed. No pertinent surgical history. Social History:  reports that he has never smoked. He has never used smokeless tobacco. He reports current alcohol use. He  reports that he does not use drugs.  Allergies  Allergen Reactions   Amoxicillin Rash   Levaquin [Levofloxacin] Rash    Family History  Problem Relation Age of Onset   Diabetes Other     Prior to Admission medications   Medication Sig Start Date End Date Taking? Authorizing Provider  clonazePAM (KLONOPIN) 0.5 MG tablet Take 0.5 mg by mouth as needed for anxiety. 10/23/21  Yes [provider]  escitalopram (LEXAPRO) 5 MG tablet Take 1 tablet by mouth at bedtime. 06/25/22  Yes [provider]  ibuprofen (ADVIL) 200 MG tablet Take 400 mg by mouth every 6 (six) hours as needed for moderate pain.   Yes [provider]  Insulin Glargine (TOUJEO SOLOSTAR [place]) Inject 60 Units into the skin at bedtime.   Yes [provider]  insulin lispro (HUMALOG) 100 UNIT/ML KwikPen Inject 10-20 Units into the skin in the morning, at noon, in the evening, and at bedtime. Before meals sliding scale 05/28/21  Yes [provider]    Physical Exam: Vitals:   06/30/22 0236 06/30/22 0329 06/30/22 0329 06/30/22 0541  BP: (!) 111/56 107/63  122/64  Pulse: (!) 130 (!) 130  (!) 113  Resp: 18   19  Temp: (!) 102.5 F (39.2 C)  (!) 102.4 F (39.1 C) 99.7 F (37.6 C)  TempSrc: Oral  Oral Oral  SpO2: 98% 91%  96%  Weight:      Height:       Constitutional: NAD,  calm, comfortable Respiratory: clear to auscultation bilaterally, no wheezing, no crackles. Normal respiratory effort. No accessory muscle use.  Cardiovascular: Regular rate and rhythm, no murmurs / rubs / gallops. No extremity edema. 2+ pedal pulses. Abdomen: no tenderness, no masses palpated.  Bowel sounds positive.  Musculoskeletal: no clubbing / cyanosis. No joint deformity upper and lower extremities. Good ROM, no contractures. Normal muscle tone.  Skin: no rashes, lesions, ulcers.  Patient has a very large area of erythema and induration involving the medial left buttock adjacent to the rectum.  There small  incision consistent with prior attempted I/D and no drainage noted.  This area is quite tender to even minimal palpation. Neurologic: CN 2-12 grossly intact. Sensation intact, DTR normal. Strength 5/5 x all 4 extremities.  Psychiatric: Normal judgment and insight. Alert and oriented x 3. Normal mood.     Data Reviewed:  Upon initial presentation to the ED sodium was 134, potassium 3.2, CO2 23, glucose 109, BUN 21, creatinine 1.48, LFTs normal, initial lactic acid was 2.3  WBC 19,600 with a hemoglobin of 13.9, hematocrit 41.8 and platelets 309,000.  Left shift with neutrophils 84%  Urinalysis demonstrated glycosuria with a glucose greater than 500, moderate hemoglobin and 100 of protein, blood cultures have been obtained at [facility] ED.  In addition a culture was obtained from the I&D site at [facility] and results are pending.  Since arrival we have repeated blood cultures and other labs as follows  Sodium remains low at 135 in context of glucose of 258, CO2 now is low at 18, anion gap is 16, total bilirubin 1.3  White count is 21,100 with a hemoglobin of 11.2 and hematocrit of 35.6, platelets are 240,000, neutrophils are elevated at 88  MRSA PCR was negative  CT of the abdomen and pelvis obtained after arrival to [facility] demonstrated no acute abdominal findings but also made no mention of any soft tissue issues involving the rectum or the gluteal regions.  There was also a patchy left lower lung infiltrate or atelectasis  Assessment and Plan: Sepsis secondary to left gluteal abscess/possible perirectal abscess Has sepsis physiology as follows: WBC in the 19,000-21,000 range, source of infection, system dysfunction as evidenced by acute kidney injury as well as evolving DKA General surgery has been consulted-unclear if area would respond to operative debridement-discussed with surgery and will keep patient n.p.o. for now Have initiated broad-spectrum antibiotics with vancomycin and  cefepime Did have culture obtained from I&D completed in [facility] ED but this is likely more superficial specimen and not an appropriate deep specimen that would warrant true culture results Patient is hemodynamically stable and mentating appropriately Follow-up on blood cultures obtained at [facility] as well as blood cultures obtained at [facility] long    Acute kidney injury Baseline creatinine 0.86 Presenting creatinine 1.48 and has increased to 1.94 since arrival Increase IV fluid rate from 50 cc/h to 125 cc/h Follow creatinine as well as potassium especially with use of lactated Ringer's Avoid nephrotoxic medications  Diabetes mellitus 2 on long-term insulin with hyperglycemia/evolving DKA Initially had normal serum CO2 and anion gap but follow-up labs reveal mild elevation in anion gap likely from evolving AKI as well as dehydration At this time we will continue to follow CBGs every 4 hours and provide sliding scale insulin and aggressively hydrate as above Will repeat bmet at 4 PM Will hold home Toujeo since May need to remain n.p.o. for possible surgical intervention which may need to be completed under  general anesthesia Check hemoglobin A1c-patient reports that typical CBG readings around 150 but given his acute infectious process he has been as high as 438  Obesity BMI 34.7 Weight reduction strategies per PCP   Advance Care Planning:   Code Status: Full Code   DVT prophylaxis: Lovenox  Consults: General surgery  Family Communication: Father at bedside  Severity of Illness: The appropriate patient status for this patient is INPATIENT. Inpatient status is judged to be reasonable and necessary in order to provide the required intensity of service to ensure the patient's safety. The patient's presenting symptoms, physical exam findings, and initial radiographic and laboratory data in the context of their chronic comorbidities is felt to place them at high risk for further clinical  deterioration. Furthermore, it is not anticipated that the patient will be medically stable for discharge from the hospital within 2 midnights of admission.   * I certify that at the point of admission it is my clinical judgment that the patient will require inpatient hospital care spanning beyond 2 midnights from the point of admission due to high intensity of service, high risk for further deterioration and high frequency of surveillance required.*  Author: Junious Silk, NP 06/30/2022 8:28 AM  For on call review [email or web address].  I have discussed the case with Ms. Rennis Harding and agree with plan as laid above.  32 year old with past medical history of diabetes mellitus obesity comes in for an evolving buttock abscess CT scan showed possible inflammation noted fluid collection, General surgery was consulted and according to the patient surgery recommended I&D in the OR. He was started empirically on IV vancomycin and cefepime.  He is again fever and has a leukocytosis vitals are stable.  General exam: In no acute distress. Respiratory system: Good air movement and clear to auscultation. Cardiovascular system: S1 & S2 heard, RRR. No JVD. Gastrointestinal system: Abdomen is nondistended, soft and nontender.  Extremities: No pedal edema. Psychiatry: Judgement and insight appear normal. Mood & affect appropriate.  Assessment and plan: Sepsis secondary to left gluteal abscess and possible perirectal abscess: With sepsis etiology improving continue IV fluids and IV antibiotics. General surgery has been consulted and as per patient they will proceed with I&D. Blood cultures have been sent.  Acute kidney injury: With a baseline creatinine of less than 1 likely due to sepsis he was started on IV fluids his creatinine on admission was 1.9. He has been fluid resuscitated aggressively we will recheck basic metabolic panel tomorrow morning.  Diabetes mellitus type 2 with long-term insulin with  hyperglycemia and evolving DKA: Continue aggressive IV fluid hydration, will go ahead and start him on Levemir 20 units twice a day he takes 60 units plus meal coverage at home. Continue CBGs every 4 hours.  Obesity: Noted he has been counseled.   Lambert Keto

## 2022-06-30 NOTE — Progress Notes (Signed)
   06/30/22 1630  Provider Notification  Provider Name/Title Richardson Landry MD-Anesthesia  Date Provider Notified 06/30/22  Time Provider Notified 1630  Method of Notification Call  Notification Reason Other (Comment) (High HR & Low SPO2)  Provider response At bedside;Other (Comment) (Patient stable for floor)  Date of Provider Response 06/30/22  Time of Provider Response 1630

## 2022-06-30 NOTE — Anesthesia Procedure Notes (Signed)
Procedure Name: Intubation Date/Time: 06/30/2022 1:27 PM  Performed by: Doran Clay, CRNAPre-anesthesia Checklist: Patient identified, Emergency Drugs available, Suction available, Patient being monitored and Timeout performed Patient Re-evaluated:Patient Re-evaluated prior to induction Oxygen Delivery Method: Circle system utilized Preoxygenation: Pre-oxygenation with 100% oxygen Induction Type: IV induction Laryngoscope Size: Mac and 4 Grade View: Grade I Tube type: Oral Tube size: 7.5 mm Number of attempts: 1 Airway Equipment and Method: Stylet Placement Confirmation: ETT inserted through vocal cords under direct vision, positive ETCO2 and breath sounds checked- equal and bilateral Secured at: 23 cm Tube secured with: Tape Dental Injury: Teeth and Oropharynx as per pre-operative assessment

## 2022-06-30 NOTE — Progress Notes (Signed)
Pharmacy Antibiotic Note  [name] is a 32 y.o. male admitted on 06/29/2022 with  perirectal abscess .  Pharmacy has been consulted for Vanco, Cefepime dosing.  Active Problem(s): Boil>>Left buttock abscess. Transferred from [facility] (ED has attempted drainage but no major pus) for CT scan. Glucose >400  PMH: Poorly controlled diabetic Type 1, obesity  ID: perirectal abscess  - Tmax 102.5, WBC 19.6, LA remains elevated  Vanco 6/3>> Cefepime 6/3>>  Plan: Cefepime 2g IV q8hr Vanco 2g IV x 1 then Vancomycin 750 mg IV Q 12 hrs. Goal AUC 400-550. Expected AUC: 488 SCr used: 1.48    Height: 5\' 7"  (170.2 cm) Weight: 100.7 kg (222 lb) IBW/kg (Calculated) : 66.1  Temp (24hrs), Avg:100.9 F (38.3 C), Min:99.7 F (37.6 C), Max:102.5 F (39.2 C)  Recent Labs  Lab 06/29/22 1555 06/29/22 1716 06/29/22 1906 06/29/22 2029  WBC 19.6*  --   --   --   CREATININE 1.48*  --   --   --   LATICACIDVEN 2.3* 2.7* 1.0 2.3*    Estimated Creatinine Clearance: 82.5 mL/min (A) (by C-G formula based on SCr of 1.48 mg/dL (H)).    Allergies  Allergen Reactions   Amoxicillin Rash   Levaquin [Levofloxacin] Rash     Lakin Romer S. Merilynn Finland, PharmD, BCPS Clinical Staff Pharmacist [email or web address] Pasty Spillers 06/30/2022 7:18 AM

## 2022-06-30 NOTE — Inpatient Diabetes Management (Signed)
Inpatient Diabetes Program Recommendations  AACE/ADA: New Consensus Statement on Inpatient Glycemic Control (2015)  Target Ranges:  Prepandial:   less than 140 mg/dL      Peak postprandial:   less than 180 mg/dL (1-2 hours)      Critically ill patients:  140 - 180 mg/dL   Lab Results  Component Value Date   GLUCAP 242 (H) 06/30/2022   HGBA1C 13.3 (H) 10/08/2011    Review of Glycemic Control  Latest Reference Range & Units 06/29/22 15:48 06/30/22 08:06 06/30/22 11:55  Glucose-Capillary 70 - 99 mg/dL 161 (H) 096 (H) 045 (H)   Diabetes history: DM 1 Outpatient Diabetes medications:  Humalog 10-20 units tid with meals Toujeo 60 units q HS Current orders for Inpatient glycemic control:  Novolog 0-9 units q 4 hours Levemir 20 units bid  Inpatient Diabetes Program Recommendations:    Agree with current orders.  Patient is currently in surgery.  Once patient is able to eat, consider adding Novolog 8 units tid with meals (meal coverage- hold if patient eats less than 50% or NPO).  Will follow up on 6/4- A1C pending.    Thanks,  Beryl Meager, RN, BC-ADM Inpatient Diabetes Coordinator Pager [phone]  (8a-5p)

## 2022-06-30 NOTE — ED Provider Notes (Signed)
Patient transferred from [facility] for CT scan CT scan is unremarkable. Patient with left buttock abscess, but is febrile and tachycardic. He would need to be admitted for IV fluids, antibiotics and wound care Noted to be febrile and tachycardic, though his lactate is normal.  Will treat his fever Discussed with Dr. Margo Aye for admission   Zadie Rhine, MD 06/30/22 [phone]

## 2022-06-30 NOTE — Anesthesia Postprocedure Evaluation (Signed)
Anesthesia Post Note  Patient: [name]  Procedure(s) Performed: IRRIGATION AND DEBRIDEMENT PERIRECTAL ABSCESS     Patient location during evaluation: PACU Anesthesia Type: General Level of consciousness: awake and alert Pain management: pain level controlled Vital Signs Assessment: post-procedure vital signs reviewed and stable Respiratory status: spontaneous breathing, nonlabored ventilation, respiratory function stable and patient connected to nasal cannula oxygen Cardiovascular status: blood pressure returned to baseline and stable Postop Assessment: no apparent nausea or vomiting Anesthetic complications: no   No notable events documented.  Last Vitals:  Vitals:   06/30/22 1600 06/30/22 1615  BP: 119/65 117/62  Pulse: (!) 120 (!) 119  Resp: 18 18  Temp:    SpO2: 98% 96%    Last Pain:  Vitals:   06/30/22 1600  TempSrc:   PainSc: 0-No pain                 Trevor Iha

## 2022-07-01 ENCOUNTER — Encounter (HOSPITAL_COMMUNITY): Payer: Self-pay | Admitting: General Surgery

## 2022-07-01 DIAGNOSIS — K611 Rectal abscess: Secondary | ICD-10-CM | POA: Diagnosis present

## 2022-07-01 DIAGNOSIS — A0472 Enterocolitis due to Clostridium difficile, not specified as recurrent: Secondary | ICD-10-CM

## 2022-07-01 DIAGNOSIS — E1165 Type 2 diabetes mellitus with hyperglycemia: Secondary | ICD-10-CM

## 2022-07-01 DIAGNOSIS — N179 Acute kidney failure, unspecified: Secondary | ICD-10-CM | POA: Diagnosis present

## 2022-07-01 DIAGNOSIS — L03317 Cellulitis of buttock: Secondary | ICD-10-CM | POA: Diagnosis not present

## 2022-07-01 DIAGNOSIS — A419 Sepsis, unspecified organism: Secondary | ICD-10-CM | POA: Diagnosis not present

## 2022-07-01 DIAGNOSIS — L0231 Cutaneous abscess of buttock: Secondary | ICD-10-CM | POA: Diagnosis not present

## 2022-07-01 HISTORY — DX: Enterocolitis due to Clostridium difficile, not specified as recurrent: A04.72

## 2022-07-01 LAB — COMPREHENSIVE METABOLIC PANEL
ALT: 22 U/L (ref 0–44)
AST: 23 U/L (ref 15–41)
Albumin: 2.3 g/dL — ABNORMAL LOW (ref 3.5–5.0)
Alkaline Phosphatase: 109 U/L (ref 38–126)
Anion gap: 10 (ref 5–15)
BUN: 27 mg/dL — ABNORMAL HIGH (ref 6–20)
CO2: 23 mmol/L (ref 22–32)
Calcium: 7.5 mg/dL — ABNORMAL LOW (ref 8.9–10.3)
Chloride: 105 mmol/L (ref 98–111)
Creatinine, Ser: 2.21 mg/dL — ABNORMAL HIGH (ref 0.61–1.24)
GFR, Estimated: 40 mL/min — ABNORMAL LOW (ref >60)
Glucose, Bld: 180 mg/dL — ABNORMAL HIGH (ref 70–99)
Potassium: 3.7 mmol/L (ref 3.5–5.1)
Sodium: 138 mmol/L (ref 135–145)
Total Bilirubin: 0.5 mg/dL (ref 0.3–1.2)
Total Protein: 5.8 g/dL — ABNORMAL LOW (ref 6.5–8.1)

## 2022-07-01 LAB — CBC WITH DIFFERENTIAL/PLATELET
Abs Immature Granulocytes: 0.2 10*3/uL — ABNORMAL HIGH (ref 0.00–0.07)
Basophils Absolute: 0.1 10*3/uL (ref 0.0–0.1)
Basophils Relative: 1 %
Eosinophils Absolute: 0.1 10*3/uL (ref 0.0–0.5)
Eosinophils Relative: 0 %
HCT: 33.6 % — ABNORMAL LOW (ref 39.0–52.0)
Hemoglobin: 11 g/dL — ABNORMAL LOW (ref 13.0–17.0)
Immature Granulocytes: 1 %
Lymphocytes Relative: 5 %
Lymphs Abs: 0.9 10*3/uL (ref 0.7–4.0)
MCH: 28.8 pg (ref 26.0–34.0)
MCHC: 32.7 g/dL (ref 30.0–36.0)
MCV: 88 fL (ref 80.0–100.0)
Monocytes Absolute: 1.6 10*3/uL — ABNORMAL HIGH (ref 0.1–1.0)
Monocytes Relative: 8 %
Neutro Abs: 17.2 10*3/uL — ABNORMAL HIGH (ref 1.7–7.7)
Neutrophils Relative %: 85 %
Platelets: 232 10*3/uL (ref 150–400)
RBC: 3.82 MIL/uL — ABNORMAL LOW (ref 4.22–5.81)
RDW: 13.7 % (ref 11.5–15.5)
WBC: 20 10*3/uL — ABNORMAL HIGH (ref 4.0–10.5)
nRBC: 0 % (ref 0.0–0.2)

## 2022-07-01 LAB — GLUCOSE, CAPILLARY
Glucose-Capillary: 183 mg/dL — ABNORMAL HIGH (ref 70–99)
Glucose-Capillary: 202 mg/dL — ABNORMAL HIGH (ref 70–99)
Glucose-Capillary: 140 mg/dL — ABNORMAL HIGH (ref 70–99)
Glucose-Capillary: 106 mg/dL — ABNORMAL HIGH (ref 70–99)

## 2022-07-01 LAB — HEMOGLOBIN A1C
Hgb A1c MFr Bld: 12.3 % — ABNORMAL HIGH (ref 4.8–5.6)
Mean Plasma Glucose: 306 mg/dL

## 2022-07-01 LAB — AEROBIC/ANAEROBIC CULTURE W GRAM STAIN (SURGICAL/DEEP WOUND)

## 2022-07-01 LAB — CULTURE, BLOOD (ROUTINE X 2)

## 2022-07-01 LAB — HIV ANTIBODY (ROUTINE TESTING W REFLEX): HIV Screen 4th Generation wRfx: NONREACTIVE

## 2022-07-01 MED ORDER — INSULIN ASPART 100 UNIT/ML IJ SOLN
8.0000 [IU] | Freq: Three times a day (TID) | INTRAMUSCULAR | Status: DC
  Administered 2022-07-01 (×2): 8 [IU] via SUBCUTANEOUS

## 2022-07-01 MED ORDER — SODIUM CHLORIDE 0.9 % IV SOLN: INTRAVENOUS | Status: DC

## 2022-07-01 NOTE — Progress Notes (Addendum)
[facility] PROGRESS NOTE    Progress Note  DMON LITAKER  ZOX:[id number] DOB: [birth year] DOA: 06/29/2022 PCP: Roe Rutherford, NP     Brief Narrative:   [name] is an 32 y.o. male  with medical history significant of diabetes mellitus on insulin, obesity with BMI 34.7.  Patient presented to the urgent care initially with complaints of evolving buttock abscess.  Because of his history of diabetes and severity and appearance of the abscess he was sent to the [facility] ED for further evaluation.  He was evaluated by EDP as well as general surgery at [facility] and surgical wound status post I&D   Assessment/Plan:   Sepsis secondary to perirectal abscess/ Abscess of buttock Blood cultures were sent. Started on IV empiric antibiotics. To general surgery was consulted and status post I&D Currently on IV Vanco and Zosyn leukocytosis improving, he is defervescing. Further management per surgery.  Acute kidney injury: With baseline creatinine 1.4, up to 2.2. In the setting of NSAID use and IV contrast in a diabetic patient. Follow strict I's and O's, having about a liter of urine output. Continue IV fluids.  Uncontrolled diabetes mellitus type 2 with hyperglycemia: He was not in DKA, continue long-acting insulin plus sliding scale. Blood glucose still elevated. Increase sliding scale. Allow a diet.   DVT prophylaxis: lovenox Family Communication:Father Status is: Inpatient Remains inpatient appropriate because: Sepsis due to abscess.    Code Status:     Code Status Orders  (From admission, onward)           Start     Ordered   06/30/22 0658  Full code  Continuous       Question:  By:  Answer:  Other   06/30/22 0658           Code Status History     Date Active Date Inactive Code Status Order ID Comments User Context   03/11/2013 2358 03/15/2013 1355 Full Code [id number]  Meredeth Ide, MD Inpatient   10/08/2011 0221 10/09/2011 1259 Full Code [id number]   Ward, Gwenevere Abbot, RN Inpatient   06/28/2011 1008 06/29/2011 1153 Full Code [id number]  Oswaldo Milian, RN Inpatient   03/22/2011 0223 03/23/2011 1442 Full Code [id number]  Tarry Kos A Inpatient         IV Access:   Peripheral IV   Procedures and diagnostic studies:   CT ABDOMEN PELVIS W CONTRAST  Result Date: 06/29/2022 CLINICAL DATA:  Sepsis EXAM: CT ABDOMEN AND PELVIS WITH CONTRAST TECHNIQUE: Multidetector CT imaging of the abdomen and pelvis was performed using the standard protocol following bolus administration of intravenous contrast. RADIATION DOSE REDUCTION: This exam was performed according to the departmental dose-optimization program which includes automated exposure control, adjustment of the mA and/or kV according to patient size and/or use of iterative reconstruction technique. CONTRAST:  OMNIPAQUE IOHEXOL 300 MG/ML  SOLN COMPARISON:  04/13/2013 FINDINGS: Lower chest: No pleural effusion. No pneumothorax. Patchy atelectasis or infiltrate posteriorly at the left lung base. Hepatobiliary: No focal liver abnormality is seen. No gallstones, gallbladder wall thickening, or biliary dilatation. Pancreas: Unremarkable. No pancreatic ductal dilatation or surrounding inflammatory changes. Spleen: Normal in size without focal abnormality. Adrenals/Urinary Tract: No adrenal mass. Symmetric renal enhancement without focal lesion, urolithiasis, or hydronephrosis. Urinary bladder is distended. Stomach/Bowel: Stomach incompletely distended, unremarkable. A few gas distended small bowel loops in the left mid abdomen, the proximal and distal loops are decompressed and unremarkable. Normal appendix. Colon is partially distended by  gas and fecal material, unremarkable. Vascular/Lymphatic: No significant vascular findings are present. No enlarged abdominal or pelvic lymph nodes. Reproductive: Prostate is unremarkable. Other: No ascites.  No free air. 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 Mild vertebral compression deformities T10 and T12, stable since previous. No acute findings. IMPRESSION: 1. No acute abdominal findings. 2. Patchy atelectasis or infiltrate posteriorly at the left lung base. Electronically Signed   By: Corlis Leak M.D.   On: 06/29/2022 21:58     Medical Consultants:   None.   Subjective:    Dorena Dew relates he feels better tolerating his diet.  Objective:    Vitals:   06/30/22 2320 07/01/22 0352 07/01/22 0557 07/01/22 0733  BP: 130/71 110/71 127/64 137/74  Pulse: (!) 122  (!) 120 (!) 118  Resp:  18 16   Temp: (!) 101.1 F (38.4 C) 99.2 F (37.3 C) (!) 100.5 F (38.1 C) 98.9 F (37.2 C)  TempSrc: Oral Oral Oral Oral  SpO2: 91% 95% 92% 95%  Weight:      Height:       SpO2: 95 % O2 Flow Rate (L/min): 4 L/min   Intake/Output Summary (Last 24 hours) at 07/01/2022 0748 Last data filed at 07/01/2022 0615 Gross per 24 hour  Intake 6872.23 ml  Output 850 ml  Net 6022.23 ml   Filed Weights   06/29/22 1532 06/30/22 1232  Weight: 100.7 kg 100.7 kg    Exam: General exam: In no acute distress. Respiratory system: Good air movement and clear to auscultation. Cardiovascular system: S1 & S2 heard, RRR. No JVD. Gastrointestinal system: Abdomen is nondistended, soft and nontender.  Extremities: No pedal edema. Skin: No rashes, lesions or ulcers Psychiatry: Judgement and insight appear normal. Mood & affect appropriate.    Data Reviewed:    Labs: Basic Metabolic Panel: Recent Labs  Lab 06/29/22 1555 06/30/22 0842 06/30/22 1521 07/01/22 0429  NA 134* 134* 135 138  K 3.2* 4.1 4.1 3.7  CL 101 100 103 105  CO2 23 18* 20* 23  GLUCOSE 109* 258* 303* 180*  BUN 21* 22* 24* 27*  CREATININE 1.48* 1.91* 2.04* 2.21*  CALCIUM 8.6* 7.8* 7.4* 7.5*   GFR Estimated Creatinine Clearance: 55.2 mL/min (A) (by C-G formula based on SCr of 2.21 mg/dL (H)). Liver Function Tests: Recent Labs  Lab 06/29/22 1555 06/30/22 0842  07/01/22 0429  AST 21 27 23   ALT 20 26 22   ALKPHOS 105 102 109  BILITOT 1.0 1.3* 0.5  PROT 7.3 6.0* 5.8*  ALBUMIN 3.3* 2.6* 2.3*   Recent Labs  Lab 06/29/22 1555  LIPASE 30   No results for input(s): "AMMONIA" in the last 168 hours. Coagulation profile Recent Labs  Lab 06/29/22 2029  INR 1.1   COVID-19 Labs  Recent Labs    06/30/22 0842  CRP 28.9*    No results found for: "SARSCOV2NAA"  CBC: Recent Labs  Lab 06/29/22 1555 06/30/22 0842 07/01/22 0429  WBC 19.6* 21.1* 20.0*  NEUTROABS 16.9* 18.6* 17.2*  HGB 13.9 11.2* 11.0*  HCT 41.8 35.6* 33.6*  MCV 85.8 88.8 88.0  PLT 308 240 232   Cardiac Enzymes: No results for input(s): "CKTOTAL", "CKMB", "CKMBINDEX", "TROPONINI" in the last 168 hours. BNP (last 3 results) No results for input(s): "PROBNP" in the last 8760 hours. CBG: Recent Labs  Lab 06/30/22 1802 06/30/22 2217 06/30/22 2323 07/01/22 0354 07/01/22 0735  GLUCAP 244* 226* 242* 183* 202*   D-Dimer: No results for input(s): "DDIMER" in  the last 72 hours. Hgb A1c: No results for input(s): "HGBA1C" in the last 72 hours. Lipid Profile: No results for input(s): "CHOL", "HDL", "LDLCALC", "TRIG", "CHOLHDL", "LDLDIRECT" in the last 72 hours. Thyroid function studies: No results for input(s): "TSH", "T4TOTAL", "T3FREE", "THYROIDAB" in the last 72 hours.  Invalid input(s): "FREET3" Anemia work up: No results for input(s): "VITAMINB12", "FOLATE", "FERRITIN", "TIBC", "IRON", "RETICCTPCT" in the last 72 hours. Sepsis Labs: Recent Labs  Lab 06/29/22 1555 06/29/22 1716 06/29/22 1906 06/29/22 2029 06/30/22 0842 06/30/22 1130 07/01/22 0429  WBC 19.6*  --   --   --  21.1*  --  20.0*  LATICACIDVEN 2.3*   < > 1.0 2.3* 1.8 2.2*  --    < > = values in this interval not displayed.   Microbiology Recent Results (from the past 240 hour(s))  Blood Culture (routine x 2)     Status: None (Preliminary result)   Collection Time: 06/29/22  3:46 PM   Specimen:  BLOOD RIGHT HAND  Result Value Ref Range Status   Specimen Description   Final    BLOOD RIGHT HAND BOTTLES DRAWN AEROBIC AND ANAEROBIC   Special Requests   Final    Blood Culture adequate volume Performed at [facility], [address] [place], [place] [address]    Culture PENDING  Incomplete   Report Status PENDING  Incomplete  Blood Culture (routine x 2)     Status: None (Preliminary result)   Collection Time: 06/29/22  3:55 PM   Specimen: Right Antecubital; Blood  Result Value Ref Range Status   Specimen Description   Final    RIGHT ANTECUBITAL BOTTLES DRAWN AEROBIC AND ANAEROBIC   Special Requests   Final    Blood Culture results may not be optimal due to an excessive volume of blood received in culture bottles Performed at [facility], [address] [place], [place] [address]    Culture PENDING  Incomplete   Report Status PENDING  Incomplete  Aerobic Culture w Gram Stain (superficial specimen)     Status: None (Preliminary result)   Collection Time: 06/29/22  5:00 PM   Specimen: Abscess  Result Value Ref Range Status   Specimen Description   Final    ABSCESS Performed at [facility], [address], [place], [place] [address]    Special Requests   Final    NONE Performed at [facility], [address] [place], [place] [address]    Gram Stain   Final    RARE WBC PRESENT, PREDOMINANTLY PMN NO ORGANISMS SEEN    Culture   Final    NO GROWTH < 12 HOURS Performed at [facility], [address] [place], [place] [address]    Report Status PENDING  Incomplete  MRSA Next Gen by PCR, Nasal     Status: None   Collection Time: 06/30/22  8:37 AM   Specimen: Nasal Mucosa; Nasal Swab  Result Value Ref Range Status   MRSA by PCR Next Gen NOT DETECTED NOT DETECTED Final    Comment: (NOTE) The GeneXpert MRSA Assay (FDA approved for NASAL specimens only), is one component of a comprehensive MRSA colonization surveillance program. It is not intended to diagnose MRSA  infection nor to guide or monitor treatment for MRSA infections. Test performance is not FDA approved in patients less than 42 years old. Performed at [facility], [address] [place], [place] [address]   Aerobic/Anaerobic Culture w Gram Stain (surgical/deep wound)  Status: None (Preliminary result)   Collection Time: 06/30/22  1:39 PM   Specimen: Path fluid; Abscess  Result Value Ref Range Status   Specimen Description   Final    ABSCESS PERIRECTAL Performed at [facility], [address] [place], [place] [address]    Special Requests   Final    NONE Performed at [facility], [address], [place], [place] [address]    Gram Stain   Final    NO WBC SEEN MODERATE GRAM POSITIVE COCCI MODERATE GRAM NEGATIVE RODS RARE GRAM POSITIVE RODS Performed at [facility], [address] [place], [place] [address]    Culture PENDING  Incomplete   Report Status PENDING  Incomplete     Medications:    acetaminophen  1,000 mg Oral Q6H   enoxaparin (LOVENOX) injection  40 mg Subcutaneous Q24H   insulin aspart  0-20 Units Subcutaneous TID WC   insulin aspart  0-5 Units Subcutaneous QHS   insulin aspart  4 Units Subcutaneous TID WC   insulin detemir  25 Units Subcutaneous BID   sodium chloride flush  3 mL Intravenous Q12H   Continuous Infusions:  ceFEPime (MAXIPIME) IV 200 mL/hr at 07/01/22 0615   vancomycin        LOS: 1 day   Marinda Elk  [facility]  07/01/2022, 7:48 AM

## 2022-07-01 NOTE — Progress Notes (Signed)
1 Day Post-Op  Subjective: Feels much better today thus far.    ROS: See above, otherwise other systems negative  Objective: Vital signs in last 24 hours: Temp:  [98.9 F (37.2 C)-101.6 F (38.7 C)] 98.9 F (37.2 C) (06/04 0733) Pulse Rate:  [115-140] 118 (06/04 0733) Resp:  [11-29] 16 (06/04 0557) BP: (110-137)/(55-83) 137/74 (06/04 0733) SpO2:  [87 %-100 %] 95 % (06/04 0733) Weight:  [100.7 kg] 100.7 kg (06/03 1232) Last BM Date : 06/29/22  Intake/Output from previous day: 06/03 0701 - 06/04 0700 In: 6872.2 [P.O.:840; I.V.:3958; IV Piggyback:2074.3] Out: 850 [Urine:850] Intake/Output this shift: No intake/output data recorded.  PE: Skin: decrease in erythema and some of the induration.  Packing left in place  Lab Results:  Recent Labs    06/30/22 0842 07/01/22 0429  WBC 21.1* 20.0*  HGB 11.2* 11.0*  HCT 35.6* 33.6*  PLT 240 232   BMET Recent Labs    06/30/22 1521 07/01/22 0429  NA 135 138  K 4.1 3.7  CL 103 105  CO2 20* 23  GLUCOSE 303* 180*  BUN 24* 27*  CREATININE 2.04* 2.21*  CALCIUM 7.4* 7.5*   PT/INR Recent Labs    06/29/22 2029  LABPROT 14.0  INR 1.1   CMP     Component Value Date/Time   NA 138 07/01/2022 0429   K 3.7 07/01/2022 0429   CL 105 07/01/2022 0429   CO2 23 07/01/2022 0429   GLUCOSE 180 (H) 07/01/2022 0429   BUN 27 (H) 07/01/2022 0429   CREATININE 2.21 (H) 07/01/2022 0429   CALCIUM 7.5 (L) 07/01/2022 0429   PROT 5.8 (L) 07/01/2022 0429   ALBUMIN 2.3 (L) 07/01/2022 0429   AST 23 07/01/2022 0429   ALT 22 07/01/2022 0429   ALKPHOS 109 07/01/2022 0429   BILITOT 0.5 07/01/2022 0429   GFRNONAA 40 (L) 07/01/2022 0429   GFRAA >60 10/05/2014 1300   Lipase     Component Value Date/Time   LIPASE 30 06/29/2022 1555       Studies/Results: CT ABDOMEN PELVIS W CONTRAST  Result Date: 06/29/2022 CLINICAL DATA:  Sepsis EXAM: CT ABDOMEN AND PELVIS WITH CONTRAST TECHNIQUE: Multidetector CT imaging of the abdomen and pelvis  was performed using the standard protocol following bolus administration of intravenous contrast. RADIATION DOSE REDUCTION: This exam was performed according to the departmental dose-optimization program which includes automated exposure control, adjustment of the mA and/or kV according to patient size and/or use of iterative reconstruction technique. CONTRAST:  OMNIPAQUE IOHEXOL 300 MG/ML  SOLN COMPARISON:  04/13/2013 FINDINGS: Lower chest: No pleural effusion. No pneumothorax. Patchy atelectasis or infiltrate posteriorly at the left lung base. Hepatobiliary: No focal liver abnormality is seen. No gallstones, gallbladder wall thickening, or biliary dilatation. Pancreas: Unremarkable. No pancreatic ductal dilatation or surrounding inflammatory changes. Spleen: Normal in size without focal abnormality. Adrenals/Urinary Tract: No adrenal mass. Symmetric renal enhancement without focal lesion, urolithiasis, or hydronephrosis. Urinary bladder is distended. Stomach/Bowel: Stomach incompletely distended, unremarkable. A few gas distended small bowel loops in the left mid abdomen, the proximal and distal loops are decompressed and unremarkable. Normal appendix. Colon is partially distended by gas and fecal material, unremarkable. Vascular/Lymphatic: No significant vascular findings are present. No enlarged abdominal or pelvic lymph nodes. Reproductive: Prostate is unremarkable. Other: No ascites.  No free air. 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 Mild vertebral compression deformities T10 and T12, stable since previous. No acute findings. IMPRESSION: 1. No acute abdominal findings. 2. Patchy  atelectasis or infiltrate posteriorly at the left lung base. Electronically Signed   By: Corlis Leak M.D.   On: 06/29/2022 21:58    Anti-infectives: Anti-infectives (From admission, onward)    Start     Dose/Rate Route Frequency Ordered Stop   07/01/22 2000  vancomycin (VANCOREADY) IVPB 1750 mg/350  mL        1,750 mg 175 mL/hr over 120 Minutes Intravenous Every 36 hours 06/30/22 1801     07/01/22 0000  ceFEPIme (MAXIPIME) 2 g in sodium chloride 0.9 % 100 mL IVPB        2 g 200 mL/hr over 30 Minutes Intravenous Every 12 hours 06/30/22 1801     06/30/22 2100  vancomycin (VANCOREADY) IVPB 750 mg/150 mL  Status:  Discontinued        750 mg 150 mL/hr over 60 Minutes Intravenous Every 12 hours 06/30/22 0718 06/30/22 1801   06/30/22 0800  vancomycin (VANCOREADY) IVPB 2000 mg/400 mL        2,000 mg 200 mL/hr over 120 Minutes Intravenous NOW 06/30/22 0705 06/30/22 1222   06/30/22 0800  ceFEPIme (MAXIPIME) 2 g in sodium chloride 0.9 % 100 mL IVPB  Status:  Discontinued        2 g 200 mL/hr over 30 Minutes Intravenous Every 8 hours 06/30/22 0708 06/30/22 1801   06/29/22 1800  cefTRIAXone (ROCEPHIN) 2 g in sodium chloride 0.9 % 100 mL IVPB        2 g 200 mL/hr over 30 Minutes Intravenous  Once 06/29/22 1747 06/29/22 1910        Assessment/Plan POD 1, s/p I&D of perirectal abscess, Dr. Dwain Sarna 6/3 -WBC stable today around 20K -cont abx therapy -cx pending, gram + cocci/rods, gram - rods present -leave packing today -remove at bedside tomorrow and determine if he needs further debridement -NPO p MN -d/w he and his wife at bedside today.  FEN - carb mod diet, NPO p MN VTE - Lovenox ID - maxipime, vanc  Type I diabetes   LOS: 1 day    Letha Cape , [facility] 07/01/2022, 9:05 AM Please see Amion for pager number during day hours 7:00am-4:30pm or 7:00am -11:30am on weekends

## 2022-07-02 ENCOUNTER — Inpatient Hospital Stay (HOSPITAL_COMMUNITY): Payer: BLUE CROSS/BLUE SHIELD

## 2022-07-02 ENCOUNTER — Inpatient Hospital Stay (HOSPITAL_COMMUNITY): Payer: BLUE CROSS/BLUE SHIELD | Admitting: Anesthesiology

## 2022-07-02 ENCOUNTER — Encounter (HOSPITAL_COMMUNITY)
Admission: EM | Disposition: A | Discharge status: Home Health | Payer: Self-pay | Source: Home / Self Care | Attending: Family Medicine

## 2022-07-02 ENCOUNTER — Encounter (HOSPITAL_COMMUNITY): Payer: Self-pay | Admitting: Internal Medicine

## 2022-07-02 ENCOUNTER — Other Ambulatory Visit: Payer: Self-pay

## 2022-07-02 DIAGNOSIS — N179 Acute kidney failure, unspecified: Secondary | ICD-10-CM | POA: Diagnosis not present

## 2022-07-02 DIAGNOSIS — K611 Rectal abscess: Secondary | ICD-10-CM | POA: Diagnosis not present

## 2022-07-02 DIAGNOSIS — R6520 Severe sepsis without septic shock: Secondary | ICD-10-CM | POA: Diagnosis not present

## 2022-07-02 DIAGNOSIS — A401 Sepsis due to streptococcus, group B: Secondary | ICD-10-CM | POA: Diagnosis not present

## 2022-07-02 DIAGNOSIS — N493 Fournier gangrene: Secondary | ICD-10-CM

## 2022-07-02 DIAGNOSIS — L03317 Cellulitis of buttock: Secondary | ICD-10-CM | POA: Diagnosis not present

## 2022-07-02 DIAGNOSIS — J9601 Acute respiratory failure with hypoxia: Secondary | ICD-10-CM | POA: Diagnosis present

## 2022-07-02 DIAGNOSIS — L0231 Cutaneous abscess of buttock: Secondary | ICD-10-CM | POA: Diagnosis not present

## 2022-07-02 DIAGNOSIS — N5089 Other specified disorders of the male genital organs: Secondary | ICD-10-CM | POA: Insufficient documentation

## 2022-07-02 DIAGNOSIS — A419 Sepsis, unspecified organism: Secondary | ICD-10-CM | POA: Diagnosis not present

## 2022-07-02 HISTORY — PX: INCISION AND DRAINAGE OF WOUND: SHX1803

## 2022-07-02 LAB — BASIC METABOLIC PANEL
Anion gap: 12 (ref 5–15)
BUN: 22 mg/dL — ABNORMAL HIGH (ref 6–20)
CO2: 19 mmol/L — ABNORMAL LOW (ref 22–32)
Calcium: 7.1 mg/dL — ABNORMAL LOW (ref 8.9–10.3)
Chloride: 106 mmol/L (ref 98–111)
Creatinine, Ser: 1.55 mg/dL — ABNORMAL HIGH (ref 0.61–1.24)
GFR, Estimated: >60 mL/min (ref >60)
Glucose, Bld: 102 mg/dL — ABNORMAL HIGH (ref 70–99)
Potassium: 3.7 mmol/L (ref 3.5–5.1)
Sodium: 137 mmol/L (ref 135–145)

## 2022-07-02 LAB — GLUCOSE, CAPILLARY
Glucose-Capillary: 112 mg/dL — ABNORMAL HIGH (ref 70–99)
Glucose-Capillary: 126 mg/dL — ABNORMAL HIGH (ref 70–99)
Glucose-Capillary: 126 mg/dL — ABNORMAL HIGH (ref 70–99)
Glucose-Capillary: 134 mg/dL — ABNORMAL HIGH (ref 70–99)
Glucose-Capillary: 145 mg/dL — ABNORMAL HIGH (ref 70–99)
Glucose-Capillary: 30 mg/dL — CL (ref 70–99)
Glucose-Capillary: 68 mg/dL — ABNORMAL LOW (ref 70–99)
Glucose-Capillary: 92 mg/dL (ref 70–99)

## 2022-07-02 LAB — CBC
HCT: 32.3 % — ABNORMAL LOW (ref 39.0–52.0)
Hemoglobin: 10.5 g/dL — ABNORMAL LOW (ref 13.0–17.0)
MCH: 28.5 pg (ref 26.0–34.0)
MCHC: 32.5 g/dL (ref 30.0–36.0)
MCV: 87.8 fL (ref 80.0–100.0)
Platelets: 256 10*3/uL (ref 150–400)
RBC: 3.68 MIL/uL — ABNORMAL LOW (ref 4.22–5.81)
RDW: 14.4 % (ref 11.5–15.5)
WBC: 16.7 10*3/uL — ABNORMAL HIGH (ref 4.0–10.5)
nRBC: 0 % (ref 0.0–0.2)

## 2022-07-02 LAB — CULTURE, BLOOD (ROUTINE X 2): Culture: NO GROWTH

## 2022-07-02 LAB — AEROBIC CULTURE W GRAM STAIN (SUPERFICIAL SPECIMEN)

## 2022-07-02 LAB — CREATININE, SERUM
Creatinine, Ser: 1.57 mg/dL — ABNORMAL HIGH (ref 0.61–1.24)
GFR, Estimated: >60 mL/min (ref >60)

## 2022-07-02 LAB — BLOOD GAS, ARTERIAL
Acid-base deficit: 2.7 mmol/L — ABNORMAL HIGH (ref 0.0–2.0)
Bicarbonate: 22.6 mmol/L (ref 20.0–28.0)
Drawn by: 331471
FIO2: 100 %
MECHVT: 520 mL
O2 Saturation: 100 %
PEEP: 5 cmH2O
Patient temperature: 38.9
RATE: 24 resp/min
pCO2 arterial: 43 mmHg (ref 32–48)
pH, Arterial: 7.33 — ABNORMAL LOW (ref 7.35–7.45)
pO2, Arterial: 165 mmHg — ABNORMAL HIGH (ref 83–108)

## 2022-07-02 LAB — BRAIN NATRIURETIC PEPTIDE: B Natriuretic Peptide: 342.1 pg/mL — ABNORMAL HIGH (ref 0.0–100.0)

## 2022-07-02 SURGERY — IRRIGATION AND DEBRIDEMENT WOUND
Anesthesia: General

## 2022-07-02 MED ORDER — DEXTROSE 50 % IV SOLN
INTRAVENOUS | Status: AC
  Administered 2022-07-02: 25 g via INTRAVENOUS
  Filled 2022-07-02: qty 50

## 2022-07-02 MED ORDER — SODIUM CHLORIDE 0.9 % IR SOLN
Status: DC | PRN
  Administered 2022-07-02: 3000 mL

## 2022-07-02 MED ORDER — AMOXICILLIN 250 MG PO CAPS
250.0000 mg | ORAL_CAPSULE | Freq: Once | ORAL | Status: DC
  Filled 2022-07-02: qty 1

## 2022-07-02 MED ORDER — AMOXICILLIN 500 MG PO CAPS: 500.0000 mg | ORAL_CAPSULE | Freq: Once | ORAL | Status: DC

## 2022-07-02 MED ORDER — MIDAZOLAM HCL 2 MG/2ML IJ SOLN
INTRAMUSCULAR | Status: AC
  Administered 2022-07-02: 4 mg via INTRAVENOUS
  Filled 2022-07-02: qty 4

## 2022-07-02 MED ORDER — EPINEPHRINE 0.3 MG/0.3ML IJ SOAJ: 0.3000 mg | Freq: Once | INTRAMUSCULAR | Status: DC | PRN

## 2022-07-02 MED ORDER — FENTANYL CITRATE PF 50 MCG/ML IJ SOSY: 50.0000 ug | PREFILLED_SYRINGE | Freq: Once | INTRAMUSCULAR | Status: DC

## 2022-07-02 MED ORDER — METRONIDAZOLE 500 MG/100ML IV SOLN: 500.0000 mg | Freq: Two times a day (BID) | INTRAVENOUS | Status: DC

## 2022-07-02 MED ORDER — FENTANYL 2500MCG IN NS 250ML (10MCG/ML) PREMIX INFUSION
50.0000 ug/h | INTRAVENOUS | Status: DC
  Administered 2022-07-02: 50 ug/h via INTRAVENOUS
  Administered 2022-07-03 (×3): 250 ug/h via INTRAVENOUS
  Filled 2022-07-02 (×3): qty 250

## 2022-07-02 MED ORDER — PROPOFOL 1000 MG/100ML IV EMUL
0.0000 ug/kg/min | INTRAVENOUS | Status: DC
  Administered 2022-07-02: 20 ug/kg/min via INTRAVENOUS
  Administered 2022-07-02: 50 ug/kg/min via INTRAVENOUS
  Administered 2022-07-02 (×2): 80 ug/kg/min via INTRAVENOUS
  Administered 2022-07-03: 35 ug/kg/min via INTRAVENOUS
  Administered 2022-07-03: 25 ug/kg/min via INTRAVENOUS
  Administered 2022-07-03: 35 ug/kg/min via INTRAVENOUS
  Administered 2022-07-03 (×2): 25 ug/kg/min via INTRAVENOUS
  Administered 2022-07-03: 35 ug/kg/min via INTRAVENOUS
  Filled 2022-07-02 (×8): qty 100

## 2022-07-02 MED ORDER — ORAL CARE MOUTH RINSE
15.0000 mL | OROMUCOSAL | Status: DC
  Administered 2022-07-02 – 2022-07-04 (×19): 15 mL via OROMUCOSAL

## 2022-07-02 MED ORDER — MIDAZOLAM HCL 2 MG/2ML IJ SOLN
INTRAMUSCULAR | Status: AC
  Filled 2022-07-02: qty 2

## 2022-07-02 MED ORDER — DEXTROSE 50 % IV SOLN
INTRAVENOUS | Status: AC
  Administered 2022-07-02: 12.5 g via INTRAVENOUS
  Filled 2022-07-02: qty 50

## 2022-07-02 MED ORDER — FENTANYL CITRATE (PF) 100 MCG/2ML IJ SOLN
INTRAMUSCULAR | Status: AC
  Filled 2022-07-02: qty 2

## 2022-07-02 MED ORDER — DEXTROSE 50 % IV SOLN
12.5000 g | INTRAVENOUS | Status: AC
  Administered 2022-07-02: 12.5 g via INTRAVENOUS

## 2022-07-02 MED ORDER — ROCURONIUM BROMIDE 50 MG/5ML IV SOLN: 100.0000 mg | Freq: Once | INTRAVENOUS | Status: AC

## 2022-07-02 MED ORDER — ALBUMIN HUMAN 5 % IV SOLN: INTRAVENOUS | Status: DC | PRN

## 2022-07-02 MED ORDER — DEXTROSE 50 % IV SOLN: 25.0000 g | INTRAVENOUS | Status: AC

## 2022-07-02 MED ORDER — BUPIVACAINE HCL 0.25 % IJ SOLN
INTRAMUSCULAR | Status: AC
  Filled 2022-07-02: qty 1

## 2022-07-02 MED ORDER — ORAL CARE MOUTH RINSE: 15.0000 mL | Freq: Once | OROMUCOSAL | Status: AC

## 2022-07-02 MED ORDER — ALBUMIN HUMAN 5 % IV SOLN
INTRAVENOUS | Status: AC
  Filled 2022-07-02: qty 250

## 2022-07-02 MED ORDER — FENTANYL CITRATE PF 50 MCG/ML IJ SOSY: 50.0000 ug | PREFILLED_SYRINGE | INTRAMUSCULAR | Status: DC | PRN

## 2022-07-02 MED ORDER — DOCUSATE SODIUM 50 MG/5ML PO LIQD
100.0000 mg | Freq: Two times a day (BID) | ORAL | Status: DC
  Administered 2022-07-02 – 2022-07-03 (×4): 100 mg
  Filled 2022-07-02 (×4): qty 10

## 2022-07-02 MED ORDER — ONDANSETRON HCL 4 MG/2ML IJ SOLN
INTRAMUSCULAR | Status: DC | PRN
  Administered 2022-07-02: 4 mg via INTRAVENOUS

## 2022-07-02 MED ORDER — MIDAZOLAM HCL 2 MG/2ML IJ SOLN: 4.0000 mg | Freq: Once | INTRAMUSCULAR | Status: AC

## 2022-07-02 MED ORDER — DIPHENHYDRAMINE HCL 50 MG/ML IJ SOLN: 25.0000 mg | Freq: Once | INTRAMUSCULAR | Status: DC | PRN

## 2022-07-02 MED ORDER — ORAL CARE MOUTH RINSE: 15.0000 mL | OROMUCOSAL | Status: DC | PRN

## 2022-07-02 MED ORDER — LACTATED RINGERS IV BOLUS: 1000.0000 mL | Freq: Three times a day (TID) | INTRAVENOUS | Status: AC | PRN

## 2022-07-02 MED ORDER — LACTATED RINGERS IV BOLUS: 1000.0000 mL | Freq: Once | INTRAVENOUS | Status: DC

## 2022-07-02 MED ORDER — 0.9 % SODIUM CHLORIDE (POUR BTL) OPTIME
TOPICAL | Status: DC | PRN
  Administered 2022-07-02: 1000 mL

## 2022-07-02 MED ORDER — LIDOCAINE HCL (PF) 2 % IJ SOLN
INTRAMUSCULAR | Status: AC
  Filled 2022-07-02: qty 5

## 2022-07-02 MED ORDER — POLYETHYLENE GLYCOL 3350 17 G PO PACK: 17.0000 g | PACK | Freq: Every day | ORAL | Status: DC

## 2022-07-02 MED ORDER — METRONIDAZOLE 500 MG/100ML IV SOLN
500.0000 mg | Freq: Two times a day (BID) | INTRAVENOUS | Status: DC
  Administered 2022-07-02 – 2022-07-08 (×12): 500 mg via INTRAVENOUS
  Filled 2022-07-02 (×12): qty 100

## 2022-07-02 MED ORDER — SUGAMMADEX SODIUM 200 MG/2ML IV SOLN
INTRAVENOUS | Status: DC | PRN
  Administered 2022-07-02: 400 mg via INTRAVENOUS

## 2022-07-02 MED ORDER — METRONIDAZOLE 500 MG PO TABS: 500.0000 mg | ORAL_TABLET | Freq: Two times a day (BID) | ORAL | Status: DC

## 2022-07-02 MED ORDER — ORAL CARE MOUTH RINSE: 15.0000 mL | OROMUCOSAL | Status: DC

## 2022-07-02 MED ORDER — FENTANYL CITRATE (PF) 100 MCG/2ML IJ SOLN
INTRAMUSCULAR | Status: AC
  Administered 2022-07-02: 100 ug via INTRAVENOUS
  Filled 2022-07-02: qty 2

## 2022-07-02 MED ORDER — PROPOFOL 1000 MG/100ML IV EMUL
INTRAVENOUS | Status: AC
  Filled 2022-07-02: qty 100

## 2022-07-02 MED ORDER — LACTATED RINGERS IV SOLN: INTRAVENOUS | Status: DC

## 2022-07-02 MED ORDER — DEXMEDETOMIDINE HCL IN NACL 200 MCG/50ML IV SOLN
0.0000 ug/kg/h | INTRAVENOUS | Status: DC
  Administered 2022-07-02 – 2022-07-03 (×4): 0.4 ug/kg/h via INTRAVENOUS
  Administered 2022-07-03: 0.6 ug/kg/h via INTRAVENOUS
  Administered 2022-07-03: 0.5 ug/kg/h via INTRAVENOUS
  Administered 2022-07-03 – 2022-07-04 (×2): 0.4 ug/kg/h via INTRAVENOUS
  Filled 2022-07-02 (×9): qty 50

## 2022-07-02 MED ORDER — SODIUM CHLORIDE 0.9 % IV SOLN
2.0000 g | INTRAVENOUS | Status: DC
  Administered 2022-07-02 – 2022-07-07 (×6): 2 g via INTRAVENOUS
  Filled 2022-07-02 (×6): qty 20

## 2022-07-02 MED ORDER — MIDAZOLAM HCL 2 MG/2ML IJ SOLN
INTRAMUSCULAR | Status: AC
  Filled 2022-07-02: qty 4

## 2022-07-02 MED ORDER — ROCURONIUM BROMIDE 100 MG/10ML IV SOLN
INTRAVENOUS | Status: DC | PRN
  Administered 2022-07-02: 10 mg via INTRAVENOUS
  Administered 2022-07-02: 40 mg via INTRAVENOUS

## 2022-07-02 MED ORDER — DOCUSATE SODIUM 50 MG/5ML PO LIQD: 100.0000 mg | Freq: Two times a day (BID) | ORAL | Status: DC

## 2022-07-02 MED ORDER — FENTANYL 2500MCG IN NS 250ML (10MCG/ML) PREMIX INFUSION
INTRAVENOUS | Status: AC
  Filled 2022-07-02: qty 250

## 2022-07-02 MED ORDER — INSULIN ASPART 100 UNIT/ML IJ SOLN
0.0000 [IU] | INTRAMUSCULAR | Status: DC
  Administered 2022-07-02: 3 [IU] via SUBCUTANEOUS

## 2022-07-02 MED ORDER — SODIUM CHLORIDE 0.9 % IV SOLN
250.0000 mL | INTRAVENOUS | Status: DC
  Administered 2022-07-05: 250 mL via INTRAVENOUS

## 2022-07-02 MED ORDER — SODIUM CHLORIDE 0.9 % IV SOLN
2.0000 g | INTRAVENOUS | Status: DC
  Filled 2022-07-02: qty 20

## 2022-07-02 MED ORDER — SUCCINYLCHOLINE CHLORIDE 200 MG/10ML IV SOSY
PREFILLED_SYRINGE | INTRAVENOUS | Status: DC | PRN
  Administered 2022-07-02: 120 mg via INTRAVENOUS

## 2022-07-02 MED ORDER — ACETAMINOPHEN 160 MG/5ML PO SOLN
1000.0000 mg | Freq: Four times a day (QID) | ORAL | Status: DC
  Administered 2022-07-02 – 2022-07-03 (×5): 1000 mg
  Filled 2022-07-02 (×6): qty 40.6

## 2022-07-02 MED ORDER — FENTANYL BOLUS VIA INFUSION
50.0000 ug | INTRAVENOUS | Status: DC | PRN
  Administered 2022-07-02 (×3): 100 ug via INTRAVENOUS
  Administered 2022-07-02 – 2022-07-03 (×2): 50 ug via INTRAVENOUS

## 2022-07-02 MED ORDER — ESMOLOL HCL 100 MG/10ML IV SOLN
INTRAVENOUS | Status: DC | PRN
  Administered 2022-07-02: 50 mg via INTRAVENOUS

## 2022-07-02 MED ORDER — ROCURONIUM BROMIDE 10 MG/ML (PF) SYRINGE
PREFILLED_SYRINGE | INTRAVENOUS | Status: AC
  Administered 2022-07-02: 100 mg via INTRAVENOUS
  Filled 2022-07-02: qty 10

## 2022-07-02 MED ORDER — POLYETHYLENE GLYCOL 3350 17 G PO PACK
17.0000 g | PACK | Freq: Every day | ORAL | Status: DC
  Administered 2022-07-02 – 2022-07-03 (×2): 17 g
  Filled 2022-07-02 (×2): qty 1

## 2022-07-02 MED ORDER — MIDAZOLAM HCL 5 MG/5ML IJ SOLN
INTRAMUSCULAR | Status: DC | PRN
  Administered 2022-07-02 (×2): 2 mg via INTRAVENOUS

## 2022-07-02 MED ORDER — LACTATED RINGERS IV BOLUS
1000.0000 mL | Freq: Once | INTRAVENOUS | Status: AC
  Administered 2022-07-02: 1000 mL via INTRAVENOUS

## 2022-07-02 MED ORDER — FENTANYL CITRATE (PF) 100 MCG/2ML IJ SOLN: 100.0000 ug | Freq: Once | INTRAMUSCULAR | Status: AC

## 2022-07-02 MED ORDER — FAMOTIDINE 20 MG PO TABS
20.0000 mg | ORAL_TABLET | Freq: Two times a day (BID) | ORAL | Status: DC
  Administered 2022-07-02 – 2022-07-03 (×3): 20 mg
  Filled 2022-07-02 (×4): qty 1

## 2022-07-02 MED ORDER — NOREPINEPHRINE 4 MG/250ML-% IV SOLN: 2.0000 ug/min | INTRAVENOUS | Status: DC

## 2022-07-02 MED ORDER — PROPOFOL 10 MG/ML IV BOLUS
INTRAVENOUS | Status: DC | PRN
  Administered 2022-07-02 (×2): 200 mg via INTRAVENOUS

## 2022-07-02 MED ORDER — CHLORHEXIDINE GLUCONATE 0.12 % MT SOLN
15.0000 mL | Freq: Once | OROMUCOSAL | Status: AC
  Administered 2022-07-02: 15 mL via OROMUCOSAL

## 2022-07-02 MED ORDER — CHLORHEXIDINE GLUCONATE CLOTH 2 % EX PADS
6.0000 | MEDICATED_PAD | Freq: Every day | CUTANEOUS | Status: DC
  Administered 2022-07-02 – 2022-07-10 (×9): 6 via TOPICAL

## 2022-07-02 MED ORDER — FENTANYL CITRATE (PF) 100 MCG/2ML IJ SOLN
INTRAMUSCULAR | Status: DC | PRN
  Administered 2022-07-02: 100 ug via INTRAVENOUS

## 2022-07-02 MED ORDER — DEXTROSE IN LACTATED RINGERS 5 % IV SOLN: INTRAVENOUS | Status: DC

## 2022-07-02 SURGICAL SUPPLY — 44 items
ADH SKN CLS APL DERMABOND .7 (GAUZE/BANDAGES/DRESSINGS)
BAG COUNTER SPONGE SURGICOUNT (BAG) IMPLANT
BAG SPNG CNTER NS LX DISP (BAG)
BLADE HEX COATED 2.75 (ELECTRODE) ×1 IMPLANT
BLADE SURG SZ10 CARB STEEL (BLADE) ×1 IMPLANT
BNDG GZE 12X3 1 PLY HI ABS (GAUZE/BANDAGES/DRESSINGS) ×1
BNDG STRETCH GAUZE 3IN X12FT (GAUZE/BANDAGES/DRESSINGS) IMPLANT
COVER SURGICAL LIGHT HANDLE (MISCELLANEOUS) ×1 IMPLANT
DERMABOND ADVANCED .7 DNX12 (GAUZE/BANDAGES/DRESSINGS) IMPLANT
DRAPE LAPAROSCOPIC ABDOMINAL (DRAPES) IMPLANT
DRAPE LAPAROTOMY T 102X78X121 (DRAPES) IMPLANT
DRAPE LAPAROTOMY TRNSV 102X78 (DRAPES) IMPLANT
DRAPE SHEET LG 3/4 BI-LAMINATE (DRAPES) IMPLANT
ELECT REM PT RETURN 15FT ADLT (MISCELLANEOUS) ×1 IMPLANT
GAUZE PAD ABD 8X10 STRL (GAUZE/BANDAGES/DRESSINGS) IMPLANT
GAUZE SPONGE 4X4 12PLY STRL (GAUZE/BANDAGES/DRESSINGS) ×1 IMPLANT
GLOVE BIO SURGEON STRL SZ7 (GLOVE) ×3 IMPLANT
GLOVE BIOGEL PI IND STRL 7.0 (GLOVE) ×1 IMPLANT
GLOVE BIOGEL PI IND STRL 7.5 (GLOVE) ×3 IMPLANT
GOWN STRL REUS W/ TWL LRG LVL3 (GOWN DISPOSABLE) ×2 IMPLANT
GOWN STRL REUS W/ TWL XL LVL3 (GOWN DISPOSABLE) ×1 IMPLANT
GOWN STRL REUS W/TWL LRG LVL3 (GOWN DISPOSABLE) ×2
GOWN STRL REUS W/TWL XL LVL3 (GOWN DISPOSABLE) ×1
HANDPIECE INTERPULSE COAX TIP (DISPOSABLE) ×1
KIT BASIN OR (CUSTOM PROCEDURE TRAY) ×1 IMPLANT
KIT TURNOVER KIT A (KITS) IMPLANT
MARKER SKIN DUAL TIP RULER LAB (MISCELLANEOUS) ×1 IMPLANT
NDL HYPO 25X1 1.5 SAFETY (NEEDLE) ×1 IMPLANT
NEEDLE HYPO 25X1 1.5 SAFETY (NEEDLE) ×1 IMPLANT
NS IRRIG 1000ML POUR BTL (IV SOLUTION) ×1 IMPLANT
PACK BASIC VI WITH GOWN DISP (CUSTOM PROCEDURE TRAY) ×1 IMPLANT
PENCIL SMOKE EVACUATOR (MISCELLANEOUS) IMPLANT
SET HNDPC FAN SPRY TIP SCT (DISPOSABLE) IMPLANT
SPIKE FLUID TRANSFER (MISCELLANEOUS) IMPLANT
SPONGE T-LAP 18X18 ~~LOC~~+RFID (SPONGE) IMPLANT
SPONGE T-LAP 4X18 ~~LOC~~+RFID (SPONGE) IMPLANT
STAPLER VISISTAT 35W (STAPLE) IMPLANT
SUT MNCRL AB 4-0 PS2 18 (SUTURE) IMPLANT
SUT VIC AB 3-0 SH 18 (SUTURE) IMPLANT
SYR BULB IRRIG 60ML STRL (SYRINGE) IMPLANT
SYR CONTROL 10ML LL (SYRINGE) ×1 IMPLANT
TOWEL OR 17X26 10 PK STRL BLUE (TOWEL DISPOSABLE) ×1 IMPLANT
TOWEL OR NON WOVEN STRL DISP B (DISPOSABLE) ×1 IMPLANT
YANKAUER SUCT BULB TIP 10FT TU (MISCELLANEOUS) IMPLANT

## 2022-07-02 NOTE — Consult Note (Signed)
Reason for Consult: Severe Scrotal-Buttocks Soft Tissue Infection  Referring Physician: Harden Mo MD  [name] is an 32 y.o. male.   HPI:   1 - Severe Scrotal-Buttocks Soft Tissue Infection - admitted through ER 6/3 with severe buttocks soft tissue infection / abscess and had initial operative debridement 06/30/22. Diabetic with A1c 12's. WCX 6/3 GPC, GNR, pending and placed on rocephin. 2nd stage debridement 07/02/22 and noted some severe scrotal swelling and possilbe tracking towards scrotum.  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  Today "[name]" is seen for urgent intra-operative consultation for above.   Past Medical History:  Diagnosis Date   Diabetes mellitus    insulin pump    Past Surgical History:  Procedure Laterality Date   INCISION AND DRAINAGE ABSCESS     5-6 yrs ago, buttock   INCISION AND DRAINAGE PERIRECTAL ABSCESS N/A 06/30/2022   Procedure: IRRIGATION AND DEBRIDEMENT PERIRECTAL ABSCESS;  Surgeon: Emelia Loron, MD;  Location: [facility];  Service: General;  Laterality: N/A;   KNEE SURGERY      Family History  Problem Relation Age of Onset   Diabetes Other     Social History:  reports that he has never smoked. He has never used smokeless tobacco. He reports current alcohol use. He reports that he does not use drugs.  Allergies:  Allergies  Allergen Reactions   Amoxicillin Rash   Levaquin [Levofloxacin] Rash    Medications: I have reviewed the patient's current medications.  Results for orders placed or performed during the hospital encounter of 06/29/22 (from the past 48 hour(s))  Glucose, capillary     Status: Abnormal   Collection Time: 06/30/22  2:22 PM  Result Value Ref Range   Glucose-Capillary 272 (H) 70 - 99 mg/dL    Comment: Glucose reference range applies only to samples taken after fasting for at least 8 hours.  Basic metabolic panel     Status: Abnormal   Collection Time: 06/30/22  3:21 PM  Result Value Ref Range   Sodium 135 135 - 145  mmol/L   Potassium 4.1 3.5 - 5.1 mmol/L   Chloride 103 98 - 111 mmol/L   CO2 20 (L) 22 - 32 mmol/L   Glucose, Bld 303 (H) 70 - 99 mg/dL    Comment: Glucose reference range applies only to samples taken after fasting for at least 8 hours.   BUN 24 (H) 6 - 20 mg/dL   Creatinine, Ser 9.60 (H) 0.61 - 1.24 mg/dL   Calcium 7.4 (L) 8.9 - 10.3 mg/dL   GFR, Estimated 44 (L) >60 mL/min    Comment: (NOTE) Calculated using the CKD-EPI Creatinine Equation (2021)    Anion gap 12 5 - 15    Comment: Performed at [facility], [address], [place], [place] [address]  Glucose, capillary     Status: Abnormal   Collection Time: 06/30/22  6:02 PM  Result Value Ref Range   Glucose-Capillary 244 (H) 70 - 99 mg/dL    Comment: Glucose reference range applies only to samples taken after fasting for at least 8 hours.  Glucose, capillary     Status: Abnormal   Collection Time: 06/30/22 10:17 PM  Result Value Ref Range   Glucose-Capillary 226 (H) 70 - 99 mg/dL    Comment: Glucose reference range applies only to samples taken after fasting for at least 8 hours.  Glucose, capillary     Status: Abnormal   Collection Time: 06/30/22 11:23 PM  Result Value Ref  Range   Glucose-Capillary 242 (H) 70 - 99 mg/dL    Comment: Glucose reference range applies only to samples taken after fasting for at least 8 hours.  Glucose, capillary     Status: Abnormal   Collection Time: 07/01/22  3:54 AM  Result Value Ref Range   Glucose-Capillary 183 (H) 70 - 99 mg/dL    Comment: Glucose reference range applies only to samples taken after fasting for at least 8 hours.  Comprehensive metabolic panel     Status: Abnormal   Collection Time: 07/01/22  4:29 AM  Result Value Ref Range   Sodium 138 135 - 145 mmol/L   Potassium 3.7 3.5 - 5.1 mmol/L   Chloride 105 98 - 111 mmol/L   CO2 23 22 - 32 mmol/L   Glucose, Bld 180 (H) 70 - 99 mg/dL    Comment: Glucose reference range applies only to samples taken after  fasting for at least 8 hours.   BUN 27 (H) 6 - 20 mg/dL   Creatinine, Ser 1.61 (H) 0.61 - 1.24 mg/dL   Calcium 7.5 (L) 8.9 - 10.3 mg/dL   Total Protein 5.8 (L) 6.5 - 8.1 g/dL   Albumin 2.3 (L) 3.5 - 5.0 g/dL   AST 23 15 - 41 U/L   ALT 22 0 - 44 U/L   Alkaline Phosphatase 109 38 - 126 U/L   Total Bilirubin 0.5 0.3 - 1.2 mg/dL   GFR, Estimated 40 (L) >60 mL/min    Comment: (NOTE) Calculated using the CKD-EPI Creatinine Equation (2021)    Anion gap 10 5 - 15    Comment: Performed at [facility], [address] [place], [place] [address]  CBC with Differential/Platelet     Status: Abnormal   Collection Time: 07/01/22  4:29 AM  Result Value Ref Range   WBC 20.0 (H) 4.0 - 10.5 K/uL   RBC 3.82 (L) 4.22 - 5.81 MIL/uL   Hemoglobin 11.0 (L) 13.0 - 17.0 g/dL   HCT 54.0 (L) 98.1 - 19.1 %   MCV 88.0 80.0 - 100.0 fL   MCH 28.8 26.0 - 34.0 pg   MCHC 32.7 30.0 - 36.0 g/dL   RDW 47.8 29.5 - 62.1 %   Platelets 232 150 - 400 K/uL   nRBC 0.0 0.0 - 0.2 %   Neutrophils Relative % 85 %   Neutro Abs 17.2 (H) 1.7 - 7.7 K/uL   Lymphocytes Relative 5 %   Lymphs Abs 0.9 0.7 - 4.0 K/uL   Monocytes Relative 8 %   Monocytes Absolute 1.6 (H) 0.1 - 1.0 K/uL   Eosinophils Relative 0 %   Eosinophils Absolute 0.1 0.0 - 0.5 K/uL   Basophils Relative 1 %   Basophils Absolute 0.1 0.0 - 0.1 K/uL   Immature Granulocytes 1 %   Abs Immature Granulocytes 0.20 (H) 0.00 - 0.07 K/uL    Comment: Performed at [facility], [address] [place], [place] [address]  Glucose, capillary     Status: Abnormal   Collection Time: 07/01/22  7:35 AM  Result Value Ref Range   Glucose-Capillary 202 (H) 70 - 99 mg/dL    Comment: Glucose reference range applies only to samples taken after fasting for at least 8 hours.  Glucose, capillary     Status: Abnormal   Collection Time: 07/01/22 11:22 AM  Result Value Ref Range   Glucose-Capillary 140 (H) 70 - 99 mg/dL    Comment: Glucose  reference range applies only  to samples taken after fasting for at least 8 hours.  Glucose, capillary     Status: Abnormal   Collection Time: 07/01/22  4:38 PM  Result Value Ref Range   Glucose-Capillary 106 (H) 70 - 99 mg/dL    Comment: Glucose reference range applies only to samples taken after fasting for at least 8 hours.  Creatinine, serum     Status: Abnormal   Collection Time: 07/02/22  4:21 AM  Result Value Ref Range   Creatinine, Ser 1.57 (H) 0.61 - 1.24 mg/dL   GFR, Estimated >52 >84 mL/min    Comment: (NOTE) Calculated using the CKD-EPI Creatinine Equation (2021) Performed at [facility], [address], [place], [place] [address]   Glucose, capillary     Status: Abnormal   Collection Time: 07/02/22  5:57 AM  Result Value Ref Range   Glucose-Capillary 126 (H) 70 - 99 mg/dL    Comment: Glucose reference range applies only to samples taken after fasting for at least 8 hours.  Glucose, capillary     Status: None   Collection Time: 07/02/22  7:59 AM  Result Value Ref Range   Glucose-Capillary 92 70 - 99 mg/dL    Comment: Glucose reference range applies only to samples taken after fasting for at least 8 hours.  Basic metabolic panel     Status: Abnormal   Collection Time: 07/02/22  8:16 AM  Result Value Ref Range   Sodium 137 135 - 145 mmol/L   Potassium 3.7 3.5 - 5.1 mmol/L   Chloride 106 98 - 111 mmol/L   CO2 19 (L) 22 - 32 mmol/L   Glucose, Bld 102 (H) 70 - 99 mg/dL    Comment: Glucose reference range applies only to samples taken after fasting for at least 8 hours.   BUN 22 (H) 6 - 20 mg/dL   Creatinine, Ser 0.10 (H) 0.61 - 1.24 mg/dL   Calcium 7.1 (L) 8.9 - 10.3 mg/dL   GFR, Estimated >27 >25 mL/min    Comment: (NOTE) Calculated using the CKD-EPI Creatinine Equation (2021)    Anion gap 12 5 - 15    Comment: Performed at [facility], [address], [place], [place] [address]  CBC     Status: Abnormal   Collection Time:  07/02/22  8:16 AM  Result Value Ref Range   WBC 16.7 (H) 4.0 - 10.5 K/uL   RBC 3.68 (L) 4.22 - 5.81 MIL/uL   Hemoglobin 10.5 (L) 13.0 - 17.0 g/dL   HCT 03.4 (L) 74.2 - 59.5 %   MCV 87.8 80.0 - 100.0 fL   MCH 28.5 26.0 - 34.0 pg   MCHC 32.5 30.0 - 36.0 g/dL   RDW 63.8 75.6 - 43.3 %   Platelets 256 150 - 400 K/uL   nRBC 0.0 0.0 - 0.2 %    Comment: Performed at [facility], [address] [place], [place] [address]  Brain natriuretic peptide     Status: Abnormal   Collection Time: 07/02/22  8:16 AM  Result Value Ref Range   B Natriuretic Peptide 342.1 (H) 0.0 - 100.0 pg/mL    Comment: Performed at [facility], [address], [place], [place] [address]  Glucose, capillary     Status: Abnormal   Collection Time: 07/02/22 11:12 AM  Result Value Ref Range   Glucose-Capillary 112 (H) 70 - 99 mg/dL    Comment: Glucose reference range applies only to samples taken after fasting  for at least 8 hours.    DG CHEST PORT 1 VIEW  Result Date: 07/02/2022 CLINICAL DATA:  Hypoxia. EXAM: PORTABLE CHEST 1 VIEW COMPARISON:  October 05, 2014. FINDINGS: The heart size and mediastinal contours are within normal limits. Both lungs are clear. The visualized skeletal structures are unremarkable. IMPRESSION: No active disease. Electronically Signed   By: Lupita Raider M.D.   On: 07/02/2022 13:50    Review of Systems  Unable to perform ROS: Intubated   Blood pressure (!) 142/80, pulse (!) 119, temperature 98.2 F (36.8 C), temperature source Oral, resp. rate 17, height 5\' 7"  (1.702 m), weight 100.7 kg, SpO2 (!) 89 %. Physical Exam Constitutional:      Comments: In OR 4, medium lithotomy position, under general anesthesia.   HENT:     Head: Normocephalic.  Cardiovascular:     Comments: Regular tachycardia by monitor Abdominal:     Comments: Truncal obesity noted.   Genitourinary:    Penis: Normal.      Comments: Soft scrotal swelling most c/e reactive  hydrocele and some soft tissue edema. Skin iduration left hemi-srotum and groin area already debrided. No necrosis, edges appear pink and viable. No gross purlulence after gen surg edebridment. Scrotal compartment intact.  Left external ring area unremarkable.  Musculoskeletal:     Comments: Left groin / buttocks defect with exposed health-appearing clean-based muscle and soft tissue after gen surg debridemnt.      Assessment/Plan:   1 - Severe Scrotal-Buttocks Soft Tissue Infection - no overt necrotizing process clinically. This is encouraging. Majority of scrotal swelling apepars reacitve hydrocele / edema. Do not feel intra-scrotal exploration warranted at this time.  Discussed with Dr. Dwain Sarna that I am available to help with future debridment / reconsruction if needed from GU perspective. He may take back to OR on 6/7 and I will be available for that.  Agree that aggressive management of underlying DM1 is most important modifiable factor going forward.   Loletta Parish. 07/02/2022, 1:54 PM

## 2022-07-02 NOTE — Anesthesia Procedure Notes (Signed)
Procedure Name: Intubation Date/Time: 07/02/2022 12:54 PM  Performed by: [place], Clydene Pugh, CRNAPre-anesthesia Checklist: Patient identified, Emergency Drugs available, Suction available and Patient being monitored Patient Re-evaluated:Patient Re-evaluated prior to induction Oxygen Delivery Method: Circle system utilized Preoxygenation: Pre-oxygenation with 100% oxygen Induction Type: IV induction, Rapid sequence and Cricoid Pressure applied Laryngoscope Size: Mac and 4 Grade View: Grade I Tube type: Oral Tube size: 7.5 mm Number of attempts: 1 Airway Equipment and Method: Stylet and Oral airway Placement Confirmation: ETT inserted through vocal cords under direct vision, positive ETCO2 and breath sounds checked- equal and bilateral Secured at: 22 cm Tube secured with: Tape Dental Injury: Teeth and Oropharynx as per pre-operative assessment

## 2022-07-02 NOTE — Progress Notes (Signed)
eLink Physician-Brief Progress Note Patient Name: [name] DOB: 1991-01-04 MRN: [id number]   Date of Service  07/02/2022  HPI/Events of Note  Patient with hypoglycemia. Blood sugar 30 mg / dl, treated with D 50.  eICU Interventions  D 5 LR gtt started at 60 ml / hour x 24 hours.        Migdalia Dk 07/02/2022, 8:42 PM

## 2022-07-02 NOTE — Consult Note (Signed)
NAME:  [name], MRN:  [id number], DOB:  March 28, 1990, LOS: 2 ADMISSION DATE:  06/29/2022, CONSULTATION DATE:  6/5 REFERRING MD:  Dr. Isidoro Donning, CHIEF COMPLAINT:  Fournier's Gangrene    History of Present Illness:  Patient is encephalopathic and/or intubated; therefore, history has been obtained from chart review.  32 year old male with past medical history as below, which is significant for poorly controlled diabetes and obesity.  He presented to urgent care on 6/2 with complaints of an evolving abscess on his buttock and fevers as high as 103.  Urgent care referred him to [facility] emergency department where he was further worked up, including an unsuccessful I&D attempt, however, he was sent to [facility] for admission and CT scan.  CT scan did not describe any soft tissue changes.  Surgery was consulted for evaluation of perirectal abscess and deemed him a surgical candidate for incision and drainage.  This was done in the operating room on 6/3 and culture was sent which went on to grow group B strep.  On 6/5 evolving erythema was noted to the area and he was taken back to the operating room for further exploration.  Ultimately was found to have necrotizing infection and underwent extensive debridement.  Postoperatively he was extubated, but quickly desaturated and was reintubated.  He was transferred to the intensive care unit where PCCM was consulted for ventilator management.   Pertinent  Medical History   has a past medical history of Diabetes mellitus.   Significant Hospital Events: Including procedures, antibiotic start and stop dates in addition to other pertinent events   6/2 presented for buttock abscess 6/3 to OR for I&D 6/5 back to OR for debridement of necrotizing infection. Post op on vent.   Interim History / Subjective:    Objective   Blood pressure (!) 142/80, pulse (!) 119, temperature 98.2 F (36.8 C), temperature source Oral, resp. rate 17, height 5\' 7"  (1.702  m), weight 100.7 kg, SpO2 (!) 89 %.    Vent Mode: PRVC FiO2 (%):  [100 %] 100 % Set Rate:  [24 bmp] 24 bmp Vt Set:  [520 mL] 520 mL PEEP:  [5 cmH20] 5 cmH20 Plateau Pressure:  [27 cmH20] 27 cmH20   Intake/Output Summary (Last 24 hours) at 07/02/2022 1451 Last data filed at 07/02/2022 1449 Gross per 24 hour  Intake 2825.07 ml  Output 475 ml  Net 2350.07 ml   Filed Weights   06/29/22 1532 06/30/22 1232  Weight: 100.7 kg 100.7 kg    Examination: General: obese young adult male in NAD HENT: [place]/AT, PERRL, no JVD Lungs: Clear bilateral breath sounds.  Cardiovascular: Tachy, regular, no MRG Abdomen: Soft, NT, normoactive.  Extremities: No acute deformity, edema, or ROM limitation Neuro: Sedated with intermittent breakthrough agitation.  GU: Foley in place. Surgical dressing to perineal area. Dry.   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 Ceftriaxone and metronidazole currently.   Acute respiratory failure with hypoxia: quickly desaturated after extubation in the PACU - Full vent support - CXR and ABG pending - Fentanyl and propofol for RASS goal -2. Has been difficult to sedate. May need a third agent.  - Defer SBT until after re-exploration 6/7.  - Optimize volume status in the interim.  - Suspect  there may be a degree of sleep/obesity disordered breathing contributing as well.   AKI: secondary to severe sepsis - improved - trend BMP - IV hydration  DM: Hgb A1C 12.3 on presentation.  - CBG monitoring and SSI q 4 hours - Holding home Toujeo  MDD/GAD - holding home clonazepam, escitalopram.    Best Practice (right click and "Reselect all SmartList Selections" daily)   Diet/type: NPO DVT prophylaxis: LMWH GI prophylaxis: H2B Lines: N/A Foley:   Yes, and it is still needed Code Status:  full code Last date of multidisciplinary goals of care discussion [ Family updated at bedside 6/5]  Labs   CBC: Recent Labs  Lab 06/29/22 1555 06/30/22 0842 07/01/22 0429 07/02/22 0816  WBC 19.6* 21.1* 20.0* 16.7*  NEUTROABS 16.9* 18.6* 17.2*  --   HGB 13.9 11.2* 11.0* 10.5*  HCT 41.8 35.6* 33.6* 32.3*  MCV 85.8 88.8 88.0 87.8  PLT 308 240 232 256    Basic Metabolic Panel: Recent Labs  Lab 06/29/22 1555 06/30/22 0842 06/30/22 1521 07/01/22 0429 07/02/22 0421 07/02/22 0816  NA 134* 134* 135 138  --  137  K 3.2* 4.1 4.1 3.7  --  3.7  CL 101 100 103 105  --  106  CO2 23 18* 20* 23  --  19*  GLUCOSE 109* 258* 303* 180*  --  102*  BUN 21* 22* 24* 27*  --  22*  CREATININE 1.48* 1.91* 2.04* 2.21* 1.57* 1.55*  CALCIUM 8.6* 7.8* 7.4* 7.5*  --  7.1*   GFR: Estimated Creatinine Clearance: 78.8 mL/min (A) (by C-G formula based on SCr of 1.55 mg/dL (H)). Recent Labs  Lab 06/29/22 1555 06/29/22 1716 06/29/22 1906 06/29/22 2029 06/30/22 0842 06/30/22 1130 07/01/22 0429 07/02/22 0816  WBC 19.6*  --   --   --  21.1*  --  20.0* 16.7*  LATICACIDVEN 2.3*   < > 1.0 2.3* 1.8 2.2*  --   --    < > = values in this interval not displayed.    Liver Function Tests: Recent Labs  Lab 06/29/22 1555 06/30/22 0842 07/01/22 0429  AST 21 27 23   ALT 20 26 22   ALKPHOS 105 102 109  BILITOT 1.0 1.3* 0.5  PROT 7.3 6.0* 5.8*  ALBUMIN 3.3* 2.6* 2.3*   Recent Labs  Lab 06/29/22 1555  LIPASE 30   No results for input(s): "AMMONIA" in the last 168 hours.  ABG    Component Value Date/Time   PHART 7.330 (L) 10/07/2011 2235   PCO2ART 28.2 (L) 10/07/2011 2235   PO2ART 90.1 10/07/2011 2235   HCO3 27.3 06/29/2022 1716   TCO2 12.7 10/07/2011 2235   ACIDBASEDEF 8.8 (H) 06/28/2011 0742   O2SAT 40.9 06/29/2022 1716     Coagulation Profile: Recent Labs  Lab 06/29/22 2029  INR 1.1    Cardiac Enzymes: No results for input(s): "CKTOTAL",  "CKMB", "CKMBINDEX", "TROPONINI" in the last 168 hours.  HbA1C: Hgb A1c MFr Bld  Date/Time Value Ref Range Status  06/30/2022 08:42 AM 12.3 (H) 4.8 - 5.6 % Final    Comment:    (NOTE)         Prediabetes: 5.7 - 6.4         Diabetes: >6.4         Glycemic control for adults with diabetes: <7.0   10/08/2011 03:00 AM 13.3 (H) <5.7 % Final    Comment:    (NOTE)  According to the ADA Clinical Practice Recommendations for 2011, when HbA1c is used as a screening test:  >=6.5%   Diagnostic of Diabetes Mellitus           (if abnormal result is confirmed) 5.7-6.4%   Increased risk of developing Diabetes Mellitus References:Diagnosis and Classification of Diabetes Mellitus,Diabetes Care,2011,34(Suppl 1):S62-S69 and Standards of Medical Care in         Diabetes - 2011,Diabetes Care,2011,34 (Suppl 1):S11-S61.    CBG: Recent Labs  Lab 07/01/22 1122 07/01/22 1638 07/02/22 0557 07/02/22 0759 07/02/22 1112  GLUCAP 140* 106* 126* 92 112*    Review of Systems:   Patient is encephalopathic and/or intubated; therefore, history has been obtained from chart review.    Past Medical History:  He,  has a past medical history of Diabetes mellitus.   Surgical History:   Past Surgical History:  Procedure Laterality Date   INCISION AND DRAINAGE ABSCESS     5-6 yrs ago, buttock   INCISION AND DRAINAGE PERIRECTAL ABSCESS N/A 06/30/2022   Procedure: IRRIGATION AND DEBRIDEMENT PERIRECTAL ABSCESS;  Surgeon: Emelia Loron, MD;  Location: [facility];  Service: General;  Laterality: N/A;   KNEE SURGERY       Social History:   reports that he has never smoked. He has never used smokeless tobacco. He reports current alcohol use. He reports that he does not use drugs.   Family History:  His family history includes Diabetes in an other family member.   Allergies Allergies  Allergen Reactions   Amoxicillin Rash   Levaquin  [Levofloxacin] Rash     Home Medications  Prior to Admission medications   Medication Sig Start Date End Date Taking? Authorizing Provider  clonazePAM (KLONOPIN) 0.5 MG tablet Take 0.5 mg by mouth as needed for anxiety. 10/23/21  Yes [provider]  escitalopram (LEXAPRO) 5 MG tablet Take 1 tablet by mouth at bedtime. 06/25/22  Yes [provider]  ibuprofen (ADVIL) 200 MG tablet Take 400 mg by mouth every 6 (six) hours as needed for moderate pain.   Yes [provider]  Insulin Glargine (TOUJEO SOLOSTAR [place]) Inject 60 Units into the skin at bedtime.   Yes [provider]  insulin lispro (HUMALOG) 100 UNIT/ML KwikPen Inject 10-20 Units into the skin in the morning, at noon, in the evening, and at bedtime. Before meals sliding scale 05/28/21  Yes [provider]     Critical care time:42 minutes     Joneen Roach, AGACNP-BC  Pulmonary & Critical Care  See Amion for personal pager PCCM on call pager [phone] until 7pm. Please call Elink 7p-7a. [phone]  07/02/2022 3:25 PM

## 2022-07-02 NOTE — Consult Note (Signed)
[facility]    Date of Admission:  06/29/2022     Reason for Consult: perirectal abscess    Referring Provider: Isidoro Donning     Lines:  Peripheral iv's  Abx: 6/5-c ceftriaxone  6/2-5 cefepime 6/2-5 vanc        Assessment: 32 yo male obese, dm1, admitted 6/2 with a left perirectal abscess meeting severe sepsis criteria with aki  He is s/p I&D on 6/3. He has subjective f/c at home but bcx so far is negative He never had pilonidal cyst or perirectal abscess before   06/29/22 [place] swab cx of abscess normal flora 06/29/22 bcx ngtd  06/30/22 operative cx  group b strep 06/30/22 bcx ngtd 06/30/22 hiv screen negative  07/02/22 today surgery team reevaluated and noticed spreading redness so planned for another OR evaluation  Aki and leukocytosis improving  Reported childhood rash with amox; no anaphylaxis  Plan: Given no mrsa had grown we can stop vancomycin Will change cefepime to ceftriaxone Given the worsening change cellulitis will add flagyl on as well Monitor as possible progression to fournier's gangrene Plan around 2 weeks tx after last surgical procedure done He reports childhood mild allergic rash. Will oral challenge with amoxicillin here, and if pass, can discharge on amox-clav Discussed with primary team      ------------------------------------------------ Principal Problem:   Abscess of buttock Active Problems:   Sepsis (HCC)   Perirectal abscess   AKI (acute kidney injury) (HCC)   Type 2 diabetes mellitus with hyperglycemia, with long-term current use of insulin (HCC)   C. difficile colitis    HPI: Gerald UEHARA is a 32 y.o. male obese, dm1, admitted 6/2 with a left perirectal abscess   Patient noticed a lump left buttock about 4 days prior to admission. This gotten bigger and he developed f/c so came for evaluation  No hx perirectal abscess  No hx pilonidal cyst  Heterosexual and in monogamous  relationship  Febrile on presentation and wbc 20 Sent from  to [place] Abx started for a day prior to I&D and operative cx so far growing group b strep Bcx negative Abx vanc/cefepime  Hx amox child hood rash no anaphylaxis; willing to do oral challenge  No n/v/diarrhea  No hardware in body  No other focal pain  Today surgery reviewed wound and noticed increased redness so planning to take to or again today  Wbc had trended down   Aki on presentation but improving  Family History  Problem Relation Age of Onset   Diabetes Other     Social History   Tobacco Use   Smoking status: Never   Smokeless tobacco: Never  Substance Use Topics   Alcohol use: Yes    Comment: occasionally   Drug use: No    Allergies  Allergen Reactions   Amoxicillin Rash   Levaquin [Levofloxacin] Rash    Review of Systems: ROS All Other ROS was negative, except mentioned above   Past Medical History:  Diagnosis Date   Diabetes mellitus    insulin pump       Scheduled Meds:  [MAR Hold] acetaminophen  1,000 mg Oral Q6H   [MAR Hold] amoxicillin  500 mg Oral Once   [MAR Hold] enoxaparin (LOVENOX) injection  40 mg Subcutaneous Q24H   [MAR Hold] insulin aspart  0-20 Units Subcutaneous TID WC   [MAR Hold] insulin aspart  0-5 Units Subcutaneous QHS   [MAR Hold] insulin  aspart  8 Units Subcutaneous TID WC   [MAR Hold] insulin detemir  25 Units Subcutaneous BID   [MAR Hold] sodium chloride flush  3 mL Intravenous Q12H   Continuous Infusions:  [MAR Hold] cefTRIAXone (ROCEPHIN)  IV     lactated ringers 10 mL/hr at 07/02/22 1100   PRN Meds:.[MAR Hold] diphenhydrAMINE, [MAR Hold] EPINEPHrine, [MAR Hold]  morphine injection, [MAR Hold] ondansetron **OR** [MAR Hold] ondansetron (ZOFRAN) IV, [MAR Hold] mouth rinse, [MAR Hold] oxyCODONE, [MAR Hold] prochlorperazine   OBJECTIVE: Blood pressure (!) 142/80, pulse (!) 119, temperature 98.2 F (36.8 C), temperature source Oral,  resp. rate 17, height 5\' 7"  (1.702 m), weight 100.7 kg, SpO2 (!) 89 %.  Physical Exam  General/constitutional: no distress, pleasant HEENT: Normocephalic, PER, Conj Clear, EOMI, Oropharynx clear Neck supple CV: rrr no mrg Lungs: clear to auscultation, normal respiratory effort Abd: Soft, Nontender Ext: no edema Skin: No Rash Gu: reviewed picture today from surgery team Neuro: nonfocal MSK: no peripheral joint swelling/tenderness/warmth; back spines nontender   07/02/22 pic    Lab Results Lab Results  Component Value Date   WBC 16.7 (H) 07/02/2022   HGB 10.5 (L) 07/02/2022   HCT 32.3 (L) 07/02/2022   MCV 87.8 07/02/2022   PLT 256 07/02/2022    Lab Results  Component Value Date   CREATININE 1.55 (H) 07/02/2022   BUN 22 (H) 07/02/2022   NA 137 07/02/2022   K 3.7 07/02/2022   CL 106 07/02/2022   CO2 19 (L) 07/02/2022    Lab Results  Component Value Date   ALT 22 07/01/2022   AST 23 07/01/2022   ALKPHOS 109 07/01/2022   BILITOT 0.5 07/01/2022      Microbiology: Recent Results (from the past 240 hour(s))  Blood Culture (routine x 2)     Status: None (Preliminary result)   Collection Time: 06/29/22  3:46 PM   Specimen: BLOOD RIGHT HAND  Result Value Ref Range Status   Specimen Description   Final    BLOOD RIGHT HAND BOTTLES DRAWN AEROBIC AND ANAEROBIC   Special Requests Blood Culture adequate volume  Final   Culture   Final    NO GROWTH 3 DAYS Performed at [facility] [address] [place], [place] [address]    Report Status PENDING  Incomplete  Blood Culture (routine x 2)     Status: None (Preliminary result)   Collection Time: 06/29/22  3:55 PM   Specimen: Right Antecubital; Blood  Result Value Ref Range Status   Specimen Description   Final    RIGHT ANTECUBITAL BOTTLES DRAWN AEROBIC AND ANAEROBIC   Special Requests   Final    Blood Culture results may not be optimal due to an excessive volume of blood received in culture bottles   Culture   Final     NO GROWTH 3 DAYS Performed at [facility], [address] [place], [place] [address]    Report Status PENDING  Incomplete  Aerobic Culture w Gram Stain (superficial specimen)     Status: None   Collection Time: 06/29/22  5:00 PM   Specimen: Abscess  Result Value Ref Range Status   Specimen Description   Final    ABSCESS Performed at [facility], [address], [place], [place] [address]    Special Requests   Final    NONE Performed at [facility], [address] [place], [place] [address]    Gram Stain   Final    RARE WBC PRESENT, PREDOMINANTLY PMN  NO ORGANISMS SEEN    Culture   Final    RARE NORMAL SKIN FLORA Performed at [facility], [address] [place], [place] [address]    Report Status 07/02/2022 FINAL  Final  MRSA Next Gen by PCR, Nasal     Status: None   Collection Time: 06/30/22  8:37 AM   Specimen: Nasal Mucosa; Nasal Swab  Result Value Ref Range Status   MRSA by PCR Next Gen NOT DETECTED NOT DETECTED Final    Comment: (NOTE) The GeneXpert MRSA Assay (FDA approved for NASAL specimens only), is one component of a comprehensive MRSA colonization surveillance program. It is not intended to diagnose MRSA infection nor to guide or monitor treatment for MRSA infections. Test performance is not FDA approved in patients less than 50 years old. Performed at [facility], [address] [place], [place] [address]   Culture, blood (Routine X 2) w Reflex to ID Panel     Status: None (Preliminary result)   Collection Time: 06/30/22  9:08 AM   Specimen: BLOOD  Result Value Ref Range Status   Specimen Description   Final    BLOOD BLOOD LEFT HAND Performed at [facility], [address], [place], [place] [address]    Special Requests   Final    BOTTLES DRAWN AEROBIC ONLY Blood Culture adequate volume Performed at [facility], [address] [place], [place] [address]    Culture   Final    NO GROWTH  2 DAYS Performed at [facility], [address] [place], [place] [address]    Report Status PENDING  Incomplete  Culture, blood (Routine X 2) w Reflex to ID Panel     Status: None (Preliminary result)   Collection Time: 06/30/22 10:05 AM   Specimen: BLOOD RIGHT HAND  Result Value Ref Range Status   Specimen Description   Final    BLOOD RIGHT HAND Performed at [facility], [address], [place], [place] [address]    Special Requests   Final    AEROBIC BOTTLE ONLY Blood Culture adequate volume Performed at [facility], [address], [place], [place] [address]    Culture   Final    NO GROWTH 2 DAYS Performed at [facility], [address] [place], [place] [address]    Report Status PENDING  Incomplete  Aerobic/Anaerobic Culture w Gram Stain (surgical/deep wound)     Status: None (Preliminary result)   Collection Time: 06/30/22  1:39 PM   Specimen: Path fluid; Abscess  Result Value Ref Range Status   Specimen Description   Final    ABSCESS PERIRECTAL Performed at [facility], [address], [place], [place] [address]    Special Requests   Final    NONE Performed at [facility], [address], [place], [place] [address]    Gram Stain   Final    NO WBC SEEN MODERATE GRAM POSITIVE COCCI MODERATE GRAM NEGATIVE RODS RARE GRAM POSITIVE RODS    Culture   Final    RARE GROUP B STREP(S.AGALACTIAE)ISOLATED TESTING AGAINST S. AGALACTIAE NOT ROUTINELY PERFORMED DUE TO PREDICTABILITY OF AMP/PEN/VAN SUSCEPTIBILITY. Performed at [facility], [address] [place], [place] [address]    Report Status PENDING  Incomplete     Serology:    Imaging: If present, new imagings (plain films, ct scans, and mri) have been personally visualized and  interpreted; radiology reports have been reviewed. Decision making incorporated into the Impression / Recommendations.  06/29/22 abd pelv ct  with contrast IMPRESSION: 1. No acute abdominal findings. 2. Patchy atelectasis or infiltrate posteriorly at the left lung base.  6/5 cxr pending  Raymondo Band, MD [facility] [phone] pager    07/02/2022, 11:43 AM

## 2022-07-02 NOTE — Progress Notes (Signed)
Pharmacy Note- Penicillin Allergy Clarification   ASSESSMENT: 32 year old male reports a rash to amoxicillin as a child. He does not recall having any penicillin antibiotics since that time.    PEN-FAST Scoring   Five years or less since last reaction 0  Anaphylaxis/Angioedema OR Severe cutaneous adverse reaction  0  Treatment required for reaction  0  Total Score 0 points - Very low risk of positive penicillin allergy test (<1%)      Type of intervention (select all that apply):  Amoxicillin Oral Challenge  Impact on therapy (select all that apply):  Penicillin allergy removed and Antibiotic de-escalation   PLAN: - amoxicillin 250 mg PO x1  - Epi-Pen PRN  - IV diphenhydramine PRN  - plan communicated to primary RN   POST-CHALLENGE FOLLOW-UP:  Per RN, patient tolerated amoxicillin challenge with no complications. Will plan to transition patient to PO augmentin today and remove penicillin allergy from list.    Sharin Mons, PharmD, BCPS, BCIDP Infectious Diseases Clinical Pharmacist Phone: [phone] 07/08/2022 8:23 AM

## 2022-07-02 NOTE — Transfer of Care (Signed)
Immediate Anesthesia Transfer of Care Note  Patient: [name]  Procedure(s) Performed: IRRIGATION AND DEBRIDEMENT PERINEAL WOUND  Patient Location: ICU  Anesthesia Type:General  Level of Consciousness: Patient remains intubated per anesthesia plan  Airway & Oxygen Therapy: Patient remains intubated per anesthesia plan  Post-op Assessment: Report given to RN and Post -op Vital signs reviewed and stable  Post vital signs: Reviewed and stable  Last Vitals:  Vitals Value Taken Time  BP 132/115 07/02/22 1420  Temp    Pulse 151 07/02/22 1432  Resp 25 07/02/22 1432  SpO2 100 % 07/02/22 1432  Vitals shown include unvalidated device data.  Last Pain:  Vitals:   07/02/22 1014  TempSrc: Oral  PainSc:       Patients Stated Pain Goal: 3 (07/02/22 0936)  Complications: No notable events documented.

## 2022-07-02 NOTE — Procedures (Signed)
Bronchoscopy Procedure Note  [name]  [id number]  14-Dec-1990  Date:07/02/22  Time:6:50 PM   Provider Performing:Ilyanna Baillargeon M Thora Lance   Procedure(s):  Flexible Bronchoscopy [phone])  Indication(s) Concern for dislodged ETT  Consent Unable to obtain consent due to emergent nature of procedure.  Anesthesia See [facility] for details   Time Out Verified patient identification, verified procedure, site/side was marked, verified correct patient position, special equipment/implants available, medications/allergies/relevant history reviewed, required imaging and test results available.   Sterile Technique Usual hand hygiene, masks, gowns, and gloves were used   Procedure Description Bronchoscope advanced through endotracheal tube and into airway.  Airways were examined down to subsegmental level with findings noted below.     Findings:  - tip of ETT seated just past glottis, advanced ETT to within 3cm of carina over scope, 24cm at teeth - few secretions lining trachea, suctioned - otherwise unremarkable airway exam    Complications/Tolerance None; patient tolerated the procedure well. Chest X-ray is not needed post procedure.   EBL Minimal   Specimen(s) None

## 2022-07-02 NOTE — Op Note (Signed)
Excisional debridement:  1.  Progress note or procedure note with a detailed description of the procedure. Preoperative diagnosis: perirectal abscess Postoperative diagnosis: NSTI perineum Procedure:Debridement perineum and scrotum, 20x8x8 cm  Dr Harden Mo EBL 100 cc Specimens none Drains none Dispo icu intubated  Indications: 32 yo male DM with perirectal drained that I thought was simple abscess. Improving overall but perineum and scrotum red today.  Discussed returning to OR.   Procedure: Informed consent obtained and taken to OR. Placed under general anesthesia.  He dropped his oxygen saturation pretty significantly beyond induction.  He was then placed in lithotomy and appropriately padded.  He was prepped and draped in sterile sterile surgical fashion.  Surgical timeout was performed.  The clearly had some redness on his perineum as well as on his left thigh and his scrotum.  I then proceeded to remove the skin and soft tissue overlying this redness.  This clearly was a necrotizing soft tissue infection.  There was gray watery tissue.  I then removed a fair amount of his perineum as well as his left thigh.  I then removed a portion of his scrotum.  I did ask urology to come in while I was doing this.  The total measurement is 20 x 8 x 8 cm upon completion.  I then lavaged this area.  A dressing was placed.  He will need to return to the operating room in 1 to 2 days.  He was initially extubated in the operating room but clearly was not going to do well without so was reintubated will be transferred to the ICU.  2.  Tool used for debridement (curette, scapel, etc.)  cautery, kocher  3.  Frequency of surgical debridement.   First time  4.  Measurement of total devitalized tissue (wound surface) before and after surgical debridement.   20x8x8 cm  5.  Area and depth of devitalized tissue removed from wound.  20x8x8 cm  6.  Blood loss and description of tissue removed.  100 cc,  necrotic  7.  Evidence of the progress of the wound's response to treatment.  A.  Current wound volume (current dimensions and depth).  20x8x8 cm  B.  Presence (and extent of) of infection.  None visible now  C.  Presence (and extent of) of non viable tissue.  None visible now  D.  Other material in the wound that is expected to inhibit healing.  None, DM  8.  Was there any viable tissue removed (measurements): none

## 2022-07-02 NOTE — Progress Notes (Signed)
2 Days Post-Op  Subjective: More pain today in his scrotum.  Significant edema.  Tmax 100.3   Objective: Vital signs in last 24 hours: Temp:  [98.4 F (36.9 C)-100.3 F (37.9 C)] 98.7 F (37.1 C) (06/05 0901) Pulse Rate:  [108-132] 108 (06/05 0555) Resp:  [17-19] 18 (06/05 0555) BP: (124-145)/(67-80) 124/67 (06/05 0555) SpO2:  [90 %-98 %] 95 % (06/05 0901) Last BM Date : 06/29/22  Intake/Output from previous day: 06/04 0701 - 06/05 0700 In: 1991.4 [P.O.:225; I.V.:1566.4; IV Piggyback:200] Out: 425 [Urine:425] Intake/Output this shift: No intake/output data recorded.  PE: GU: wound with some drainage, but itself is clean.  Perineum is more indurated and erythematous with some skin excoriation and almost sloughing.  Scrotum is very edematous and red.  No crepitus.   Lab Results:  Recent Labs    07/01/22 0429 07/02/22 0816  WBC 20.0* 16.7*  HGB 11.0* 10.5*  HCT 33.6* 32.3*  PLT 232 256   BMET Recent Labs    07/01/22 0429 07/02/22 0421 07/02/22 0816  NA 138  --  137  K 3.7  --  3.7  CL 105  --  106  CO2 23  --  19*  GLUCOSE 180*  --  102*  BUN 27*  --  22*  CREATININE 2.21* 1.57* 1.55*  CALCIUM 7.5*  --  7.1*   PT/INR Recent Labs    06/29/22 2029  LABPROT 14.0  INR 1.1   CMP     Component Value Date/Time   NA 137 07/02/2022 0816   K 3.7 07/02/2022 0816   CL 106 07/02/2022 0816   CO2 19 (L) 07/02/2022 0816   GLUCOSE 102 (H) 07/02/2022 0816   BUN 22 (H) 07/02/2022 0816   CREATININE 1.55 (H) 07/02/2022 0816   CALCIUM 7.1 (L) 07/02/2022 0816   PROT 5.8 (L) 07/01/2022 0429   ALBUMIN 2.3 (L) 07/01/2022 0429   AST 23 07/01/2022 0429   ALT 22 07/01/2022 0429   ALKPHOS 109 07/01/2022 0429   BILITOT 0.5 07/01/2022 0429   GFRNONAA >60 07/02/2022 0816   GFRAA >60 10/05/2014 1300   Lipase     Component Value Date/Time   LIPASE 30 06/29/2022 1555       Studies/Results: No results found.  Anti-infectives: Anti-infectives (From admission,  onward)    Start     Dose/Rate Route Frequency Ordered Stop   07/01/22 2000  vancomycin (VANCOREADY) IVPB 1750 mg/350 mL        1,750 mg 175 mL/hr over 120 Minutes Intravenous Every 36 hours 06/30/22 1801     07/01/22 0000  ceFEPIme (MAXIPIME) 2 g in sodium chloride 0.9 % 100 mL IVPB        2 g 200 mL/hr over 30 Minutes Intravenous Every 12 hours 06/30/22 1801     06/30/22 2100  vancomycin (VANCOREADY) IVPB 750 mg/150 mL  Status:  Discontinued        750 mg 150 mL/hr over 60 Minutes Intravenous Every 12 hours 06/30/22 0718 06/30/22 1801   06/30/22 0800  vancomycin (VANCOREADY) IVPB 2000 mg/400 mL        2,000 mg 200 mL/hr over 120 Minutes Intravenous NOW 06/30/22 0705 06/30/22 1222   06/30/22 0800  ceFEPIme (MAXIPIME) 2 g in sodium chloride 0.9 % 100 mL IVPB  Status:  Discontinued        2 g 200 mL/hr over 30 Minutes Intravenous Every 8 hours 06/30/22 0708 06/30/22 1801   06/29/22 1800  cefTRIAXone (ROCEPHIN) 2  g in sodium chloride 0.9 % 100 mL IVPB        2 g 200 mL/hr over 30 Minutes Intravenous  Once 06/29/22 1747 06/29/22 1910        Assessment/Plan POD 2, s/p I&D of perirectal abscess, Dr. Dwain Sarna 6/3 -WBC down to 16.7K today -cont abx therapy -cx with Group B strep, primary to d/w ID regarding abx selection given allergies  -packing removed.  Concerned about progression in his perineum.  Will d/w MD and may likely return to OR today for further evaluation. -d/w primary service at the bedside  FEN -  NPO p MN VTE - Lovenox ID - maxipime, vanc  Type I diabetes   LOS: 2 days    Letha Cape , [facility] 07/02/2022, 9:43 AM Please see Amion for pager number during day hours 7:00am-4:30pm or 7:00am -11:30am on weekends

## 2022-07-02 NOTE — Anesthesia Postprocedure Evaluation (Signed)
Anesthesia Post Note  Patient: [name]  Procedure(s) Performed: IRRIGATION AND DEBRIDEMENT PERINEAL WOUND     Patient location during evaluation: ICU Anesthesia Type: General Level of consciousness: sedated and patient remains intubated per anesthesia plan Pain management: pain level controlled Vital Signs Assessment: post-procedure vital signs reviewed and stable Respiratory status: patient on ventilator - see flowsheet for VS Cardiovascular status: blood pressure returned to baseline, stable and tachycardic Anesthetic complications: no Comments: Patient extubated at the endo of the procedure but desaturated, became more tachypnea and required reintubation. He was taken to ICU intubated with full monitors with O2 at 100% via Ambu with 10cm PEEP.   No notable events documented.  Last Vitals:  Vitals:   07/02/22 0901 07/02/22 1014  BP:  (!) 142/80  Pulse:  (!) 119  Resp:  17  Temp: 37.1 C 36.8 C  SpO2: 95% (!) 89%    Last Pain:  Vitals:   07/02/22 1014  TempSrc: Oral  PainSc:                  Gerald Nilan A.

## 2022-07-02 NOTE — Progress Notes (Signed)
eLink Physician-Brief Progress Note Patient Name: [name] DOB: 1991-01-14 MRN: [id number]   Date of Service  07/02/2022  HPI/Events of Note  Patient with recurrence of hypoglycemia (BS 68 mg / dl) on D 5 % LR gtt at 60 ml / hour.  eICU Interventions  D 5 % LR gtt rate increased to 120 ml / hour.        Thomasene Lot Charelle Petrakis 07/02/2022, 11:23 PM

## 2022-07-02 NOTE — Progress Notes (Signed)
eLink Physician-Brief Progress Note Patient Name: [name] DOB: [birth year] MRN: [id number]   Date of Service  07/02/2022  HPI/Events of Note  BP soft with sedation on the ventilator.  eICU Interventions  Peripheral Levo gtt ordered to keep MAP > 65 mmHg.        Thomasene Lot [name] 07/02/2022, 11:48 PM

## 2022-07-02 NOTE — Inpatient Diabetes Management (Signed)
Inpatient Diabetes Program Recommendations  AACE/ADA: New Consensus Statement on Inpatient Glycemic Control (2015)  Target Ranges:  Prepandial:   less than 140 mg/dL      Peak postprandial:   less than 180 mg/dL (1-2 hours)      Critically ill patients:  140 - 180 mg/dL   Lab Results  Component Value Date   GLUCAP 92 07/02/2022   HGBA1C 12.3 (H) 06/30/2022    Review of Glycemic Control  Diabetes history: DM1 Outpatient Diabetes medications: Toujeo 60 QHS, Humalog 10-20 units QID with meals Current orders for Inpatient glycemic control: Levemir 25 BID, Novolog 0-20 TID with meals and 0-5 HS + 8 units TID  HgbA1C - 12.3%  Inpatient Diabetes Program Recommendations:    Consider decreasing Novolog to 0-15 TID with meals and 0-5 HS since pt is Type 1 DM and sensitive to insulin.  If post-prandials > 180 mg/dL, consider increasing Novolog to 10 units TID  Will f/u regarding HgbA1C of 12.3% on 6/6.  Thank you. Ailene Ards, RD, LDN, CDCES Inpatient Diabetes Coordinator [phone]

## 2022-07-02 NOTE — Progress Notes (Signed)
Patient was intubated in OR then transferred to ICU for vent management.

## 2022-07-02 NOTE — Progress Notes (Addendum)
[facility]                                                                              [name], is a 32 y.o. male, DOB - 12-Dec-1990, ZOX:[id number] Admit date - 06/29/2022    Outpatient Primary MD for the patient is Roe Rutherford, NP  LOS - 2  days  Chief Complaint  Patient presents with   Cellulitis       Brief summary   Patient is a 32 year old male with diabetes mellitus, on insulin, obesity, presented from urgent care for evolving perirectal abscess.  He was initially seen in [facility], ED and given concerns of possible evolving soft tissue necrotizing infection, was transferred to [facility] for further evaluation and treatment.  Per patient, on 5/29 he had developed a small abscess close to rectum and had pain with defecation, subsequently started having fevers chills and rigors with nausea and emesis. His blood sugars were elevated since onset of infection. Patient was admitted, surgery was consulted.   Assessment & Plan    Principal Problem: Acute perirectal abscess, severe sepsis, POA -Patient met severe sepsis criteria with fevers, tachypnea, tachycardia, AKI -Surgery was consulted, patient underwent I&D of perirectal abscess on 06/30/2022 -This morning packing removed with concern of progression in the perineum and return to the OR for repeat debridement -6/ 3 IntraOp cultures growing group B strep, ID consulted given progression of the infection and possibility of developing Fournier's gangrene -Blood cultures negative so far -Appreciate ID recommendations, cefepime changed to ceftriaxone, added Flagyl recommendations   Active Problems:   Acute respiratory failure with hypoxia (HCC) -This morning noted to be hypoxic with O2 sats 95% on 4 L -BNP 342.1, chest x-ray showed no active disease -Received a call from Dr. Dwain Sarna, patient was extubated after the surgery however he quickly got hypoxic again and had to be reintubated. -Patient will  need to go to ICU, will d/w PCCM  Scrotal edema -Likely due to progression of the acute perirectal abscess with concern for developing early Fournier's gangrene -Elevate scrotum, continue antibiotics, ID consulted, undergoing debridement again today    AKI (acute kidney injury) (HCC), metabolic acidosis -Likely due to acute perirectal abscess and severe sepsis -Creatinine trended up to 2.21, plateaued and now at 1.55  Diabetes mellitus, type I uncontrolled with hyperglycemia, with long-term current use of insulin (HCC) -Hemoglobin A1c 12.3 on admission CBG (last 3)  Recent Labs    07/02/22 0557 07/02/22 0759 07/02/22 1112  GLUCAP 126* 92 112*  -Currently on sliding scale insulin, resistant, Levemir 25 units twice daily  Obesity Estimated body mass index is 34.77 kg/m as calculated from the following:   Height as of this encounter: 5\' 7"  (1.702 m).   Weight as of this encounter: 100.7 kg.  Code Status: Full code DVT Prophylaxis:  enoxaparin (LOVENOX) injection 40 mg Start: 06/30/22 1000   Level of Care: Level of care: ICU Family Communication: Updated patient's wife Disposition Plan:      Remains inpatient appropriate:   Patient will need ICU care postoperatively, had to be reintubated   Procedures:  I&D on 6/3 Repeat I&D on  6/5 today  Consultants:   Infectious disease General surgery PCCM  Antimicrobials:   Anti-infectives (From admission, onward)    Start     Dose/Rate Route Frequency Ordered Stop   07/02/22 1200  [MAR Hold]  amoxicillin (AMOXIL) capsule 500 mg        (MAR Hold since Wed 07/02/2022 at 1052.Hold Reason: Transfer to a Procedural area)   500 mg Oral  Once 07/02/22 1018     07/02/22 1115  [MAR Hold]  cefTRIAXone (ROCEPHIN) 2 g in sodium chloride 0.9 % 100 mL IVPB        (MAR Hold since Wed 07/02/2022 at 1052.Hold Reason: Transfer to a Procedural area)   2 g 200 mL/hr over 30 Minutes Intravenous Every 24 hours 07/02/22 1018     07/01/22 2000  vancomycin  (VANCOREADY) IVPB 1750 mg/350 mL  Status:  Discontinued        1,750 mg 175 mL/hr over 120 Minutes Intravenous Every 36 hours 06/30/22 1801 07/02/22 1018   07/01/22 0000  ceFEPIme (MAXIPIME) 2 g in sodium chloride 0.9 % 100 mL IVPB  Status:  Discontinued        2 g 200 mL/hr over 30 Minutes Intravenous Every 12 hours 06/30/22 1801 07/02/22 1018   06/30/22 2100  vancomycin (VANCOREADY) IVPB 750 mg/150 mL  Status:  Discontinued        750 mg 150 mL/hr over 60 Minutes Intravenous Every 12 hours 06/30/22 0718 06/30/22 1801   06/30/22 0800  vancomycin (VANCOREADY) IVPB 2000 mg/400 mL        2,000 mg 200 mL/hr over 120 Minutes Intravenous NOW 06/30/22 0705 06/30/22 1222   06/30/22 0800  ceFEPIme (MAXIPIME) 2 g in sodium chloride 0.9 % 100 mL IVPB  Status:  Discontinued        2 g 200 mL/hr over 30 Minutes Intravenous Every 8 hours 06/30/22 0708 06/30/22 1801   06/29/22 1800  cefTRIAXone (ROCEPHIN) 2 g in sodium chloride 0.9 % 100 mL IVPB        2 g 200 mL/hr over 30 Minutes Intravenous  Once 06/29/22 1747 06/29/22 1910          Medications  [MAR Hold] acetaminophen  1,000 mg Oral Q6H   [MAR Hold] amoxicillin  500 mg Oral Once   [MAR Hold] enoxaparin (LOVENOX) injection  40 mg Subcutaneous Q24H   [MAR Hold] insulin aspart  0-20 Units Subcutaneous TID WC   [MAR Hold] insulin aspart  0-5 Units Subcutaneous QHS   [MAR Hold] insulin aspart  8 Units Subcutaneous TID WC   [MAR Hold] insulin detemir  25 Units Subcutaneous BID   [MAR Hold] sodium chloride flush  3 mL Intravenous Q12H      Subjective:   [name] was seen and examined today before surgery today.  Noted to be somewhat hypoxic on 4 L O2 via [place].  Per patient he is not on O2 at baseline.  Low-grade temp 100.3 F overnight.  Patient concerned about worsening scrotal edema and pain.  Seen by surgery at the same time with plan for repeat I&D today.  Wife at the bedside.  Objective:   Vitals:   07/02/22 0227 07/02/22 0555  07/02/22 0901 07/02/22 1014  BP: 139/80 124/67  (!) 142/80  Pulse: (!) 132 (!) 108  (!) 119  Resp: 19 18  17   Temp: 100.3 F (37.9 C) 98.8 F (37.1 C) 98.7 F (37.1 C) 98.2 F (36.8 C)  TempSrc: Oral Oral Oral Oral  SpO2:  90% 95% 95% (!) 89%  Weight:      Height:        Intake/Output Summary (Last 24 hours) at 07/02/2022 1354 Last data filed at 07/02/2022 1318 Gross per 24 hour  Intake 1632.29 ml  Output 425 ml  Net 1207.29 ml     Wt Readings from Last 3 Encounters:  06/30/22 100.7 kg  03/23/15 92.7 kg  03/20/15 90.7 kg     Exam General: Alert and oriented x 3, NAD Cardiovascular: S1 S2 auscultated,  RRR Respiratory: Bibasilar crackles Gastrointestinal: Soft, nontender, nondistended, + bowel sounds Ext: no pedal edema bilaterally Neuro: no new deficits Psych: Normal affect  GU: ++Scrotal edema with redness   Data Reviewed:  I have personally reviewed following labs    CBC Lab Results  Component Value Date   WBC 16.7 (H) 07/02/2022   RBC 3.68 (L) 07/02/2022   HGB 10.5 (L) 07/02/2022   HCT 32.3 (L) 07/02/2022   MCV 87.8 07/02/2022   MCH 28.5 07/02/2022   PLT 256 07/02/2022   MCHC 32.5 07/02/2022   RDW 14.4 07/02/2022   LYMPHSABS 0.9 07/01/2022   MONOABS 1.6 (H) 07/01/2022   EOSABS 0.1 07/01/2022   BASOSABS 0.1 07/01/2022     Last metabolic panel Lab Results  Component Value Date   NA 137 07/02/2022   K 3.7 07/02/2022   CL 106 07/02/2022   CO2 19 (L) 07/02/2022   BUN 22 (H) 07/02/2022   CREATININE 1.55 (H) 07/02/2022   GLUCOSE 102 (H) 07/02/2022   GFRNONAA >60 07/02/2022   GFRAA >60 10/05/2014   CALCIUM 7.1 (L) 07/02/2022   PHOS 4.7 (H) 12/03/2006   PROT 5.8 (L) 07/01/2022   ALBUMIN 2.3 (L) 07/01/2022   BILITOT 0.5 07/01/2022   ALKPHOS 109 07/01/2022   AST 23 07/01/2022   ALT 22 07/01/2022   ANIONGAP 12 07/02/2022    CBG (last 3)  Recent Labs    07/02/22 0557 07/02/22 0759 07/02/22 1112  GLUCAP 126* 92 112*      Coagulation  Profile: Recent Labs  Lab 06/29/22 2029  INR 1.1     Radiology Studies: I have personally reviewed the imaging studies  DG CHEST PORT 1 VIEW  Result Date: 07/02/2022 CLINICAL DATA:  Hypoxia. EXAM: PORTABLE CHEST 1 VIEW COMPARISON:  October 05, 2014. FINDINGS: The heart size and mediastinal contours are within normal limits. Both lungs are clear. The visualized skeletal structures are unremarkable. IMPRESSION: No active disease. Electronically Signed   By: Lupita Raider M.D.   On: 07/02/2022 13:50       Orvis Stann M.D. [facility] 07/02/2022, 1:54 PM  Available via Epic secure chat 7am-7pm After 7 pm, please refer to night coverage provider listed on amion.

## 2022-07-02 NOTE — Anesthesia Preprocedure Evaluation (Addendum)
Anesthesia Evaluation  Patient identified by MRN, date of birth, ID band Patient awake    Reviewed: Allergy & Precautions, NPO status , Patient's Chart, lab work & pertinent test results  Airway Mallampati: II  TM Distance: >3 FB Neck ROM: Full    Dental no notable dental hx. (+) Teeth Intact, Dental Advisory Given   Pulmonary neg pulmonary ROS   Pulmonary exam normal breath sounds clear to auscultation       Cardiovascular negative cardio ROS Normal cardiovascular exam Rhythm:Regular Rate:Normal     Neuro/Psych negative neurological ROS  negative psych ROS   GI/Hepatic negative GI ROS, Neg liver ROS,,,  Endo/Other  diabetes, Poorly Controlled, Type 1, Insulin Dependent    Renal/GU ARF and Renal InsufficiencyRenal diseaseLab Results      Component                Value               Date                      CREATININE               1.91 (H)            06/30/2022                BUN                      22 (H)              06/30/2022                NA                       134 (L)             06/30/2022                K                        4.1                 06/30/2022                    Musculoskeletal Perineal infection   Abdominal   Peds  Hematology  (+) Blood dyscrasia, anemia Lab Results      Component                Value               Date                      WBC                      21.1 (H)            06/30/2022                HGB                      11.2 (L)            06/30/2022                HCT                      35.6 (  L)            06/30/2022                MCV                      88.8                06/30/2022                PLT                      240                 06/30/2022              Anesthesia Other Findings All: Amoxiciallin, Levaquin  Reproductive/Obstetrics                             Anesthesia Physical Anesthesia Plan  ASA: 3  Anesthesia Plan:  General   Post-op Pain Management: Ofirmev IV (intra-op)* and Dilaudid IV   Induction: Intravenous, Rapid sequence and Cricoid pressure planned  PONV Risk Score and Plan: 3 and Treatment may vary due to age or medical condition, Midazolam, Dexamethasone and Metaclopromide  Airway Management Planned: Oral ETT  Additional Equipment: None  Intra-op Plan:   Post-operative Plan: Extubation in OR  Informed Consent: I have reviewed the patients History and Physical, chart, labs and discussed the procedure including the risks, benefits and alternatives for the proposed anesthesia with the patient or authorized representative who has indicated his/her understanding and acceptance.     Dental advisory given  Plan Discussed with: CRNA and Anesthesiologist  Anesthesia Plan Comments:         Anesthesia Quick Evaluation

## 2022-07-02 NOTE — Progress Notes (Signed)
RT assisted MD with bedside bronch for tube placement.

## 2022-07-03 ENCOUNTER — Inpatient Hospital Stay (HOSPITAL_COMMUNITY): Payer: BLUE CROSS/BLUE SHIELD

## 2022-07-03 ENCOUNTER — Other Ambulatory Visit: Payer: Self-pay

## 2022-07-03 ENCOUNTER — Encounter (HOSPITAL_COMMUNITY): Payer: Self-pay | Admitting: General Surgery

## 2022-07-03 DIAGNOSIS — J9601 Acute respiratory failure with hypoxia: Secondary | ICD-10-CM

## 2022-07-03 DIAGNOSIS — E10628 Type 1 diabetes mellitus with other skin complications: Secondary | ICD-10-CM

## 2022-07-03 DIAGNOSIS — A419 Sepsis, unspecified organism: Secondary | ICD-10-CM | POA: Diagnosis not present

## 2022-07-03 DIAGNOSIS — K611 Rectal abscess: Secondary | ICD-10-CM

## 2022-07-03 DIAGNOSIS — R6520 Severe sepsis without septic shock: Secondary | ICD-10-CM | POA: Diagnosis not present

## 2022-07-03 DIAGNOSIS — N493 Fournier gangrene: Secondary | ICD-10-CM | POA: Diagnosis not present

## 2022-07-03 DIAGNOSIS — L0231 Cutaneous abscess of buttock: Secondary | ICD-10-CM | POA: Diagnosis not present

## 2022-07-03 LAB — GLUCOSE, CAPILLARY
Glucose-Capillary: 160 mg/dL — ABNORMAL HIGH (ref 70–99)
Glucose-Capillary: 167 mg/dL — ABNORMAL HIGH (ref 70–99)
Glucose-Capillary: 169 mg/dL — ABNORMAL HIGH (ref 70–99)
Glucose-Capillary: 169 mg/dL — ABNORMAL HIGH (ref 70–99)
Glucose-Capillary: 194 mg/dL — ABNORMAL HIGH (ref 70–99)
Glucose-Capillary: 300 mg/dL — ABNORMAL HIGH (ref 70–99)
Glucose-Capillary: 318 mg/dL — ABNORMAL HIGH (ref 70–99)
Glucose-Capillary: 331 mg/dL — ABNORMAL HIGH (ref 70–99)

## 2022-07-03 LAB — COMPREHENSIVE METABOLIC PANEL
ALT: 23 U/L (ref 0–44)
AST: 22 U/L (ref 15–41)
Albumin: 2.1 g/dL — ABNORMAL LOW (ref 3.5–5.0)
Alkaline Phosphatase: 104 U/L (ref 38–126)
Anion gap: 9 (ref 5–15)
BUN: 17 mg/dL (ref 6–20)
CO2: 21 mmol/L — ABNORMAL LOW (ref 22–32)
Calcium: 7.3 mg/dL — ABNORMAL LOW (ref 8.9–10.3)
Chloride: 108 mmol/L (ref 98–111)
Creatinine, Ser: 1.22 mg/dL (ref 0.61–1.24)
GFR, Estimated: >60 mL/min (ref >60)
Glucose, Bld: 176 mg/dL — ABNORMAL HIGH (ref 70–99)
Potassium: 3.4 mmol/L — ABNORMAL LOW (ref 3.5–5.1)
Sodium: 138 mmol/L (ref 135–145)
Total Bilirubin: 0.5 mg/dL (ref 0.3–1.2)
Total Protein: 5.4 g/dL — ABNORMAL LOW (ref 6.5–8.1)

## 2022-07-03 LAB — BLOOD GAS, ARTERIAL
Acid-Base Excess: 0.3 mmol/L (ref 0.0–2.0)
FIO2: 40 %
RATE: 24 resp/min
pCO2 arterial: 32 mmHg (ref 32–48)

## 2022-07-03 LAB — MAGNESIUM
Magnesium: 1.7 mg/dL (ref 1.7–2.4)
Magnesium: 1.6 mg/dL — ABNORMAL LOW (ref 1.7–2.4)

## 2022-07-03 LAB — CBC
HCT: 29.7 % — ABNORMAL LOW (ref 39.0–52.0)
Hemoglobin: 9.6 g/dL — ABNORMAL LOW (ref 13.0–17.0)
MCH: 28.6 pg (ref 26.0–34.0)
MCHC: 32.3 g/dL (ref 30.0–36.0)
MCV: 88.4 fL (ref 80.0–100.0)
Platelets: 223 10*3/uL (ref 150–400)
RBC: 3.36 MIL/uL — ABNORMAL LOW (ref 4.22–5.81)
RDW: 14.6 % (ref 11.5–15.5)
WBC: 13.1 10*3/uL — ABNORMAL HIGH (ref 4.0–10.5)
nRBC: 0 % (ref 0.0–0.2)

## 2022-07-03 LAB — BASIC METABOLIC PANEL
Anion gap: 9 (ref 5–15)
BUN: 17 mg/dL (ref 6–20)
CO2: 22 mmol/L (ref 22–32)
Calcium: 7.4 mg/dL — ABNORMAL LOW (ref 8.9–10.3)
Chloride: 106 mmol/L (ref 98–111)
Creatinine, Ser: 1.29 mg/dL — ABNORMAL HIGH (ref 0.61–1.24)
GFR, Estimated: >60 mL/min (ref >60)
Glucose, Bld: 347 mg/dL — ABNORMAL HIGH (ref 70–99)
Potassium: 4.7 mmol/L (ref 3.5–5.1)
Sodium: 137 mmol/L (ref 135–145)

## 2022-07-03 LAB — CULTURE, BLOOD (ROUTINE X 2)

## 2022-07-03 LAB — TROPONIN I (HIGH SENSITIVITY): Troponin I (High Sensitivity): 21 ng/L — ABNORMAL HIGH (ref <18)

## 2022-07-03 LAB — TRIGLYCERIDES: Triglycerides: 295 mg/dL — ABNORMAL HIGH (ref <150)

## 2022-07-03 LAB — PHOSPHORUS
Phosphorus: 1.7 mg/dL — ABNORMAL LOW (ref 2.5–4.6)
Phosphorus: 2.8 mg/dL (ref 2.5–4.6)

## 2022-07-03 MED ORDER — FUROSEMIDE 10 MG/ML IJ SOLN
40.0000 mg | Freq: Once | INTRAMUSCULAR | Status: AC
  Administered 2022-07-03: 40 mg via INTRAVENOUS
  Filled 2022-07-03: qty 4

## 2022-07-03 MED ORDER — HYDROMORPHONE HCL 1 MG/ML IJ SOLN
0.5000 mg | INTRAMUSCULAR | Status: DC | PRN
  Administered 2022-07-04 – 2022-07-05 (×6): 0.5 mg via INTRAVENOUS
  Filled 2022-07-03 (×6): qty 1

## 2022-07-03 MED ORDER — SODIUM CHLORIDE 0.9 % IV SOLN
250.0000 mL | INTRAVENOUS | Status: DC
  Administered 2022-07-03 – 2022-07-05 (×3): 250 mL via INTRAVENOUS

## 2022-07-03 MED ORDER — PROSOURCE TF20 ENFIT COMPATIBL EN LIQD
60.0000 mL | Freq: Two times a day (BID) | ENTERAL | Status: DC
  Administered 2022-07-03 (×2): 60 mL
  Filled 2022-07-03 (×2): qty 60

## 2022-07-03 MED ORDER — NOREPINEPHRINE 4 MG/250ML-% IV SOLN
INTRAVENOUS | Status: AC
  Administered 2022-07-03: 2 ug/min via INTRAVENOUS
  Filled 2022-07-03: qty 250

## 2022-07-03 MED ORDER — MAGNESIUM SULFATE 2 GM/50ML IV SOLN
2.0000 g | Freq: Once | INTRAVENOUS | Status: AC
  Administered 2022-07-03: 2 g via INTRAVENOUS
  Filled 2022-07-03: qty 50

## 2022-07-03 MED ORDER — INSULIN ASPART 100 UNIT/ML IJ SOLN
0.0000 [IU] | INTRAMUSCULAR | Status: DC
  Administered 2022-07-03 (×2): 4 [IU] via SUBCUTANEOUS
  Administered 2022-07-03: 15 [IU] via SUBCUTANEOUS
  Administered 2022-07-04 (×2): 7 [IU] via SUBCUTANEOUS
  Administered 2022-07-04 (×2): 11 [IU] via SUBCUTANEOUS

## 2022-07-03 MED ORDER — INSULIN DETEMIR 100 UNIT/ML ~~LOC~~ SOLN
20.0000 [IU] | Freq: Two times a day (BID) | SUBCUTANEOUS | Status: DC
  Administered 2022-07-04: 20 [IU] via SUBCUTANEOUS
  Filled 2022-07-03 (×3): qty 0.2

## 2022-07-03 MED ORDER — PROSOURCE TF20 ENFIT COMPATIBL EN LIQD: 60.0000 mL | Freq: Every day | ENTERAL | Status: DC

## 2022-07-03 MED ORDER — ASPIRIN 325 MG PO TABS
325.0000 mg | ORAL_TABLET | Freq: Once | ORAL | Status: AC
  Administered 2022-07-03: 325 mg
  Filled 2022-07-03: qty 1

## 2022-07-03 MED ORDER — POTASSIUM CHLORIDE 20 MEQ PO PACK
40.0000 meq | PACK | Freq: Once | ORAL | Status: AC
  Administered 2022-07-03: 40 meq
  Filled 2022-07-03: qty 2

## 2022-07-03 MED ORDER — NOREPINEPHRINE 4 MG/250ML-% IV SOLN
2.0000 ug/min | INTRAVENOUS | Status: DC
  Administered 2022-07-03: 2 ug/min via INTRAVENOUS

## 2022-07-03 MED ORDER — SODIUM CHLORIDE 0.9% FLUSH
10.0000 mL | Freq: Two times a day (BID) | INTRAVENOUS | Status: DC
  Administered 2022-07-03 – 2022-07-04 (×2): 10 mL
  Administered 2022-07-04: 30 mL
  Administered 2022-07-05 (×2): 10 mL

## 2022-07-03 MED ORDER — SODIUM CHLORIDE 0.9% FLUSH
10.0000 mL | INTRAVENOUS | Status: DC | PRN
  Administered 2022-07-03: 10 mL

## 2022-07-03 MED ORDER — POTASSIUM PHOSPHATES 15 MMOLE/5ML IV SOLN
30.0000 mmol | Freq: Once | INTRAVENOUS | Status: AC
  Administered 2022-07-03: 30 mmol via INTRAVENOUS
  Filled 2022-07-03: qty 10

## 2022-07-03 MED ORDER — INSULIN DETEMIR 100 UNIT/ML ~~LOC~~ SOLN
10.0000 [IU] | Freq: Two times a day (BID) | SUBCUTANEOUS | Status: DC
  Administered 2022-07-03: 10 [IU] via SUBCUTANEOUS
  Filled 2022-07-03 (×2): qty 0.1

## 2022-07-03 MED ORDER — VITAL 1.5 CAL PO LIQD
1000.0000 mL | ORAL | Status: DC
  Administered 2022-07-03 (×2): 1000 mL
  Filled 2022-07-03 (×8): qty 1000

## 2022-07-03 MED ORDER — INSULIN ASPART 100 UNIT/ML IJ SOLN
0.0000 [IU] | INTRAMUSCULAR | Status: DC
  Administered 2022-07-03: 5 [IU] via SUBCUTANEOUS
  Administered 2022-07-03: 7 [IU] via SUBCUTANEOUS

## 2022-07-03 MED ORDER — VITAL HIGH PROTEIN PO LIQD: 1000.0000 mL | ORAL | Status: DC

## 2022-07-03 MED ORDER — DEXTROSE 10 % IV SOLN: INTRAVENOUS | Status: DC

## 2022-07-03 MED ORDER — HYDROMORPHONE HCL 1 MG/ML IJ SOLN
1.0000 mg | INTRAMUSCULAR | Status: DC | PRN
  Administered 2022-07-05 (×4): 1 mg via INTRAVENOUS
  Filled 2022-07-03 (×5): qty 1

## 2022-07-03 NOTE — Progress Notes (Signed)
Patient self extubated. Placed patient on 100% NRB after extubation, will decrease Sp02 keeping Sp02 > 92%. Patient does not appear to to in any respiratory distress at this time. No stridor heard over upper airways. E-Link is aware of extubation.

## 2022-07-03 NOTE — Inpatient Diabetes Management (Signed)
Inpatient Diabetes Program Recommendations  AACE/ADA: New Consensus Statement on Inpatient Glycemic Control (2015)  Target Ranges:  Prepandial:   less than 140 mg/dL      Peak postprandial:   less than 180 mg/dL (1-2 hours)      Critically ill patients:  140 - 180 mg/dL   Lab Results  Component Value Date   GLUCAP 194 (H) 07/03/2022   HGBA1C 12.3 (H) 06/30/2022    Review of Glycemic Control  Diabetes history: DM1 Outpatient Diabetes medications: Toujeo 60 QHS, Humalog 10-20 units QID with meals Current orders for Inpatient glycemic control: None  HgbA1C - 12.3% Type 1 DM and will need both basal and bolus insulin  Inpatient Diabetes Program Recommendations:    Consider adding Semglee 15 units BID (daily titration)  Consider adding Novolog 0-9 units Q4H  Will need Novolog coverage if TFs start  Follow closely with daily recommendations for glycemic control.  Thank you. Ailene Ards, RD, LDN, CDCES Inpatient Diabetes Coordinator [phone]

## 2022-07-03 NOTE — Progress Notes (Signed)
An USGPIV (ultrasound guided PIV) has been placed for short-term vasopressor infusion. A correctly placed ivWatch must be used when administering vasopressors. Should this treatment be needed beyond 72 hours, central line access should be obtained.  It will be the responsibility of the bedside nurse to follow best practice to prevent extravasations.   

## 2022-07-03 NOTE — Progress Notes (Signed)
Collected ordered sputum sample and sent to Lab. 

## 2022-07-03 NOTE — Progress Notes (Addendum)
eLink Physician-Brief Progress Note Patient Name: [name] DOB: [birth year] MRN: [id number]   Date of Service  07/03/2022  HPI/Events of Note  32 year old male with a history of type 1 diabetes mellitus and obesity who initially presented with perirectal abscess and underwent operative debridement found to have Fournier's gangrene and severe sepsis.  He was left intubated with the intent to return to the operating room 6/7, but was called but the patient self extubated.  Has been on propofol and intermittent fentanyl.  Initially transition to nonrebreather with 100% saturations without evidence of distress.  Then transition to nasal cannula and family at bedside, patient attempting to mouth words.  eICU Interventions  Will discontinue continuous propofol and fentanyl drips.  Transition to as needed dilaudid.  Defer swallow screen for now. Nasal cannula as needed, low-dose norepinephrine.  Maintain n.p.o., intent to return to the OR for debridement again.     Intervention Category Intermediate Interventions: Respiratory distress - evaluation and management  Sharisse Rantz 07/03/2022, 11:49 PM

## 2022-07-03 NOTE — Progress Notes (Signed)
PCCM INTERVAL PROGRESS NOTE   Called to beside to evaluate patient with ST segment changes on telemetry. EKG showing some lateral ST segment changes as compared to EKD from 6/2. Mild elevation.  Discussed with cardiology who reviewed EKG and did not feel as though the patient was a candidate for PCI at this time. Plan to cycle cardiac enzymes and check an echo. Cardiology will consult formally. ASA 235 given. Repeat EKG in 10 minutes to further evaluate.    Joneen Roach, AGACNP-BC [facility]  See Amion for personal pager PCCM on call pager [phone] until 7pm. Please call Elink 7p-7a. [phone]  07/03/2022 4:38 PM

## 2022-07-03 NOTE — Progress Notes (Signed)
Interventional Cardiology Note:  Spoke with Joneen Roach, NP, of Critical Care Team. Pt noted to have STE on tele and an EKG is done, showing lateral STE with atypical pattern for acute myocardial injury but computer EKG reads acute lateral injury. Pt intubated but reportedly has no chest pain. He has Fournier's gangrene complicated by sepsis and is POD#1 from surgical debridement with plans to take him back to the OR. His hemodynamics are not acutely changed. Repeat EKG is reviewed and shows improvement in ST segments now nearly isoelectric. Pt is not a candidate for cath or PCI with serious problems outlined above. Recommend 2D echo, cycle enzymes. Would not start IV heparin in the context of post-op status. Cardiology team will see, but likely not much to add.  Tonny Bollman MD 07/03/2022 6:29 PM

## 2022-07-03 NOTE — Progress Notes (Signed)
Peripherally Inserted Central Catheter Placement  The IV Nurse has discussed with the patient and/or persons authorized to consent for the patient, the purpose of this procedure and the potential benefits and risks involved with this procedure.  The benefits include less needle sticks, lab draws from the catheter, and the patient may be discharged home with the catheter. Risks include, but not limited to, infection, bleeding, blood clot (thrombus formation), and puncture of an artery; nerve damage and irregular heartbeat and possibility to perform a PICC exchange if needed/ordered by physician.  Alternatives to this procedure were also discussed.  Bard Power PICC patient education guide, fact sheet on infection prevention and patient information card has been provided to patient /or left at bedside. Consent obtained by wife at bedside.  PICC Placement Documentation  PICC Triple Lumen 07/03/22 Right Basilic 40 cm 0 cm (Active)  Indication for Insertion or Continuance of Line Limited venous access - need for IV therapy >5 days (PICC only) 07/03/22 1920  Exposed Catheter (cm) 0 cm 07/03/22 1920  Site Assessment Clean, Dry, Intact 07/03/22 1920  Lumen #1 Status Flushed;Saline locked;Blood return noted 07/03/22 1920  Lumen #2 Status Flushed;Saline locked;Blood return noted 07/03/22 1920  Lumen #3 Status Flushed;Saline locked;Blood return noted 07/03/22 1920  Dressing Type Transparent;Securing device 07/03/22 1920  Dressing Status Antimicrobial disc in place 07/03/22 1920  Safety Lock Not Applicable 07/03/22 1920  Line Care Connections checked and tightened 07/03/22 1920  Line Adjustment (NICU/IV Team Only) No 07/03/22 1920  Dressing Intervention New dressing 07/03/22 1920  Dressing Change Due 07/10/22 07/03/22 1920       Vernona Rieger  Wilkes Potvin 07/03/2022, 7:21 PM

## 2022-07-03 NOTE — Progress Notes (Signed)
Per RN pt fio2 increased to 60%

## 2022-07-03 NOTE — Progress Notes (Signed)
[facility]  Date of Admission:  06/29/2022     Lines:  Peripheral iv's   Abx: 6/5-c ceftriaxone   6/2-5 cefepime 6/2-5 vanc                                                            Assessment: 32 yo male obese, dm1, admitted 6/2 with a left perirectal abscess meeting severe sepsis criteria with aki   He is s/p I&D on 6/3. He has subjective f/c at home but bcx so far is negative He never had pilonidal cyst or perirectal abscess before     06/29/22 [place] swab cx of abscess normal flora 06/29/22 bcx ngtd   06/30/22 operative cx  group b strep 06/30/22 bcx ngtd 06/30/22 hiv screen negative   07/02/22 today surgery team reevaluated and noticed spreading redness so planned for another OR evaluation   ------------- 07/03/22 id assessment Patient had developed nec fasc/fournier's gangrene and taken to OR for repeat debridement into scrotal/perineal area 6/5. No mrsa on culture still. There is more plan for repeat debridement tomorrow  Aki resolved Leukocytosis improving  A1c 12's and will need better outpatient diabetic control; metabolic syndrome/poorly controlled dm likely culprit of this severe infection  6/3 initial operative cx reviewed Group b strep, peptostreptococcus, gnr/gpr/gpc on stain. No mrsa or pseudomonas found yet.   We are deferring amoxicillin challenge dose given his severe illness at this time   Plan: -continue ceftriaxone/flagyl.  -appreciate surgical and pulm/ccm care for patient -await further I&D tomorrow -discussed with primary team    Principal Problem:   Fournier's gangrene in male Active Problems:   Sepsis (HCC)   Perirectal abscess   AKI (acute kidney injury) (HCC)   Type 2 diabetes mellitus with hyperglycemia, with long-term current use of insulin (HCC)   Scrotal edema   Acute respiratory failure with hypoxia (HCC)   Allergies  Allergen Reactions   Amoxicillin Rash   Levaquin [Levofloxacin] Rash     Scheduled Meds:  acetaminophen (TYLENOL) oral liquid 160 mg/5 mL  1,000 mg Per Tube Q6H   Chlorhexidine Gluconate Cloth  6 each Topical Daily   docusate  100 mg Per Tube BID   enoxaparin (LOVENOX) injection  40 mg Subcutaneous Q24H   famotidine  20 mg Per Tube BID   feeding supplement (PROSource TF20)  60 mL Per Tube BID   fentaNYL (SUBLIMAZE) injection  50 mcg Intravenous Once   mouth rinse  15 mL Mouth Rinse Q2H   polyethylene glycol  17 g Per Tube Daily   sodium chloride flush  3 mL Intravenous Q12H   Continuous Infusions:  sodium chloride Stopped (07/03/22 0152)   sodium chloride     cefTRIAXone (ROCEPHIN)  IV Stopped (07/02/22 1601)   dexmedetomidine (PRECEDEX) IV infusion 0.4 mcg/kg/hr (07/03/22 0749)   dextrose 50 mL/hr at 07/03/22 0756   feeding supplement (VITAL 1.5 CAL)     fentaNYL infusion INTRAVENOUS 250 mcg/hr (07/03/22 0749)   lactated ringers     metronidazole Stopped (07/03/22 0505)   norepinephrine (LEVOPHED) Adult infusion 2 mcg/min (07/03/22 0845)   potassium PHOSPHATE IVPB (in mmol) 30 mmol (07/03/22 0852)   propofol (DIPRIVAN) infusion 35 mcg/kg/min (07/03/22 0818)   PRN Meds:.diphenhydrAMINE, EPINEPHrine, fentaNYL, lactated ringers,  ondansetron **OR** ondansetron (ZOFRAN) IV, mouth rinse, prochlorperazine   SUBJECTIVE: Patient's left perirectal abscess/cellulitis had progressed yesterday and he was taken to or where fournier's gangrene was found with soft tissue involvement into perineum/scrotal area  I reviewed operative note (both general surgery and urology performed the case): "clearly had some redness on his perineum as well as on his left thigh and his scrotum. I then proceeded to remove the skin and soft tissue overlying this redness. This clearly was a necrotizing soft tissue infection. There was gray watery tissue. I then removed a fair amount of his perineum as well as his left thigh. I then removed a portion of his scrotum. I did ask urology to  come in while I was doing this. The total measurement is 20 x 8 x 8 cm upon completion. I then lavaged this area. A dressing was placed. He will need to return to the operating room in 1 to 2 days"    Review of Systems: ROS All other ROS was negative, except mentioned above     OBJECTIVE: Vitals:   07/03/22 0530 07/03/22 0600 07/03/22 0800 07/03/22 0821  BP: 108/62 106/60 (!) 102/50   Pulse: 72 71 73   Resp: (!) 24 (!) 24 20   Temp:   98.6 F (37 C)   TempSrc:   Axillary   SpO2: 100% 100% 100% 92%  Weight:      Height:       Body mass index is 34.77 kg/m.  Physical Exam  General/constitutional: intubated, sedated, on pressor levophed 7.5 mcg/min HEENT: Normocephalic; ett in place CV: rrr no mrg Lungs: clear on vent; vent setting 60% fio2, 8 peep Abd: Soft, Nontender Ext: no edema Skin/gu; anterior groin/gu examined -- dressing in place, erythematous scrotal area Neuro: sedated MSK: no peripheral joint swelling/warmth  Central line presence: none  Lab Results Lab Results  Component Value Date   WBC 13.1 (H) 07/03/2022   HGB 9.6 (L) 07/03/2022   HCT 29.7 (L) 07/03/2022   MCV 88.4 07/03/2022   PLT 223 07/03/2022    Lab Results  Component Value Date   CREATININE 1.22 07/03/2022   BUN 17 07/03/2022   NA 138 07/03/2022   K 3.4 (L) 07/03/2022   CL 108 07/03/2022   CO2 21 (L) 07/03/2022    Lab Results  Component Value Date   ALT 23 07/03/2022   AST 22 07/03/2022   ALKPHOS 104 07/03/2022   BILITOT 0.5 07/03/2022      Microbiology: Recent Results (from the past 240 hour(s))  Blood Culture (routine x 2)     Status: None (Preliminary result)   Collection Time: 06/29/22  3:46 PM   Specimen: BLOOD RIGHT HAND  Result Value Ref Range Status   Specimen Description   Final    BLOOD RIGHT HAND BOTTLES DRAWN AEROBIC AND ANAEROBIC   Special Requests Blood Culture adequate volume  Final   Culture   Final    NO GROWTH 4 DAYS Performed at [facility], [address], [place], [place] [address]    Report Status PENDING  Incomplete  Blood Culture (routine x 2)     Status: None (Preliminary result)   Collection Time: 06/29/22  3:55 PM   Specimen: Right Antecubital; Blood  Result Value Ref Range Status   Specimen Description   Final    RIGHT ANTECUBITAL BOTTLES DRAWN AEROBIC AND ANAEROBIC   Special Requests   Final    Blood Culture results may not be optimal due  to an excessive volume of blood received in culture bottles   Culture   Final    NO GROWTH 4 DAYS Performed at [facility], [address] [place], [place] [address]    Report Status PENDING  Incomplete  Aerobic Culture w Gram Stain (superficial specimen)     Status: None   Collection Time: 06/29/22  5:00 PM   Specimen: Abscess  Result Value Ref Range Status   Specimen Description   Final    ABSCESS Performed at [facility], [address], [place], [place] [address]    Special Requests   Final    NONE Performed at [facility], [address], [place], [place] [address]    Gram Stain   Final    RARE WBC PRESENT, PREDOMINANTLY PMN NO ORGANISMS SEEN    Culture   Final    RARE NORMAL SKIN FLORA Performed at [facility], [address], [place], [place] [address]    Report Status 07/02/2022 FINAL  Final  MRSA Next Gen by PCR, Nasal     Status: None   Collection Time: 06/30/22  8:37 AM   Specimen: Nasal Mucosa; Nasal Swab  Result Value Ref Range Status   MRSA by PCR Next Gen NOT DETECTED NOT DETECTED Final    Comment: (NOTE) The GeneXpert MRSA Assay (FDA approved for NASAL specimens only), is one component of a comprehensive MRSA colonization surveillance program. It is not intended to diagnose MRSA infection nor to guide or monitor treatment for MRSA infections. Test performance is not FDA approved in patients less than 72 years old. Performed at [facility], [address] [place], [place] [address]   Culture, blood (Routine X 2) w Reflex to ID  Panel     Status: None (Preliminary result)   Collection Time: 06/30/22  9:08 AM   Specimen: BLOOD  Result Value Ref Range Status   Specimen Description   Final    BLOOD BLOOD LEFT HAND Performed at [facility], [address] [place], [place] [address]    Special Requests   Final    BOTTLES DRAWN AEROBIC ONLY Blood Culture adequate volume Performed at [facility], [address], [place], [place] [address]    Culture   Final    NO GROWTH 3 DAYS Performed at [facility], [address] [place], [place] [address]    Report Status PENDING  Incomplete  Culture, blood (Routine X 2) w Reflex to ID Panel     Status: None (Preliminary result)   Collection Time: 06/30/22 10:05 AM   Specimen: BLOOD RIGHT HAND  Result Value Ref Range Status   Specimen Description   Final    BLOOD RIGHT HAND Performed at [facility], [address] [place], [place] [address]    Special Requests   Final    AEROBIC BOTTLE ONLY Blood Culture adequate volume Performed at [facility], [address], [place], [place] [address]    Culture   Final    NO GROWTH 3 DAYS Performed at [facility], [address] [place], [place] [address]    Report Status PENDING  Incomplete  Aerobic/Anaerobic Culture w Gram Stain (surgical/deep wound)     Status: None (Preliminary result)   Collection Time: 06/30/22  1:39 PM   Specimen: Path fluid; Abscess  Result Value Ref Range Status   Specimen Description   Final    ABSCESS PERIRECTAL Performed  at [facility], [address], [place], [place] [address]    Special Requests   Final    NONE Performed at [facility], [address] [place], [place] [address]    Gram Stain   Final    NO WBC SEEN MODERATE GRAM POSITIVE COCCI MODERATE GRAM NEGATIVE RODS RARE GRAM POSITIVE RODS    Culture   Final    RARE GROUP B  STREP(S.AGALACTIAE)ISOLATED TESTING AGAINST S. AGALACTIAE NOT ROUTINELY PERFORMED DUE TO PREDICTABILITY OF AMP/PEN/VAN SUSCEPTIBILITY. FEW PEPTOSTREPTOCOCCUS ANAEROBIUS Standardized susceptibility testing for this organism is not available. Performed at [facility], [address] [place], [place] [address]    Report Status PENDING  Incomplete     Serology:   Imaging: If present, new imagings (plain films, ct scans, and mri) have been personally visualized and interpreted; radiology reports have been reviewed. Decision making incorporated into the Impression / Recommendations.   06/29/22 abd pelv ct with contrast IMPRESSION: 1. No acute abdominal findings. 2. Patchy atelectasis or infiltrate posteriorly at the left lung base.  6/6 cxr Diffuse pulmonary edema.   Raymondo Band, MD [facility] [phone] pager    07/03/2022, 10:28 AM

## 2022-07-03 NOTE — Progress Notes (Signed)
[facility] ADULT ICU REPLACEMENT PROTOCOL   The patient does apply for the [facility] Adult ICU Electrolyte Replacment Protocol based on the criteria listed below:   1.Exclusion criteria: TCTS, ECMO, Dialysis, and Myasthenia Gravis patients 2. Is GFR >/= 30 ml/min? Yes.    Patient's GFR today is >60 3. Is SCr </= 2? Yes.   Patient's SCr is 1.22 mg/dL 4. Did SCr increase >/= 0.5 in 24 hours? No. 5.Pt's weight >40kg  Yes.   6. Abnormal electrolyte(s):   K 3.4  7. Electrolytes replaced per protocol 8.  Call MD STAT for K+ </= 2.5, Phos </= 1, or Mag </= 1 Physician:  Shawn Stall R Edahi Kroening 07/03/2022 4:49 AM

## 2022-07-03 NOTE — Consult Note (Signed)
NAME:  [name], MRN:  [id number], DOB:  Mar 06, 1990, LOS: 3 ADMISSION DATE:  06/29/2022, CONSULTATION DATE:  6/5 REFERRING MD:  Dr. Isidoro Donning, CHIEF COMPLAINT:  Fournier's Gangrene    History of Present Illness:  Patient is encephalopathic and/or intubated; therefore, history has been obtained from chart review.  32 year old male with past medical history as below, which is significant for poorly controlled diabetes and obesity.  He presented to urgent care on 6/2 with complaints of an evolving abscess on his buttock and fevers as high as 103.  Urgent care referred him to [facility] emergency department where he was further worked up, including an unsuccessful I&D attempt, however, he was sent to [facility] for admission and CT scan.  CT scan did not describe any soft tissue changes.  Surgery was consulted for evaluation of perirectal abscess and deemed him a surgical candidate for incision and drainage.  This was done in the operating room on 6/3 and culture was sent which went on to grow group B strep.  On 6/5 evolving erythema was noted to the area and he was taken back to the operating room for further exploration.  Ultimately was found to have necrotizing infection and underwent extensive debridement.  Postoperatively he was extubated, but quickly desaturated and was reintubated.  He was transferred to the intensive care unit where PCCM was consulted for ventilator management.   Pertinent  Medical History   has a past medical history of Diabetes mellitus.   Significant Hospital Events: Including procedures, antibiotic start and stop dates in addition to other pertinent events   6/2 presented for buttock abscess 6/3 to OR for I&D 6/5 back to OR for debridement of necrotizing infection. Post op on vent.   Interim History / Subjective:  Hypoglycemia and hypotension overnight D5 infusion started Pressors ordered but not started. Improved with reduction in sedating medications. Fevers  improved 11L positive for the admission.   Objective   Blood pressure 106/60, pulse 71, temperature 98.4 F (36.9 C), temperature source Oral, resp. rate (!) 24, height 5\' 7"  (1.702 m), weight 100.7 kg, SpO2 100 %.    Vent Mode: PRVC FiO2 (%):  [40 %-100 %] 40 % Set Rate:  [24 bmp] 24 bmp Vt Set:  [520 mL] 520 mL PEEP:  [5 cmH20-10 cmH20] 10 cmH20 Plateau Pressure:  [22 cmH20-27 cmH20] 27 cmH20   Intake/Output Summary (Last 24 hours) at 07/03/2022 0732 Last data filed at 07/03/2022 0600 Gross per 24 hour  Intake 3592.44 ml  Output 1315 ml  Net 2277.44 ml    Filed Weights   06/29/22 1532 06/30/22 1232  Weight: 100.7 kg 100.7 kg    Examination: General: obese young adult male on vent HENT: [place]/AT, PERRL, no JVD Lungs: Clear bilateral breath sounds Cardiovascular: RRR, no MRG Abdomen: Soft, NT, Hypoactive.  Extremities: No acute deformity, edema, or ROM limitation Neuro: Sedated with intermittent breakthrough agitation.  GU: Foley in place. Surgical dressing to perineal area. Dry.   6/3 operative culture: group B strep  6/6 CXR with pulmonary edema  K 3.4, Phos 1.7, Creat 1.22, WBC 13, Hgb 9.6, Trig 295  Resolved Hospital Problem list     Assessment & Plan:   Fournier's gangrene secondary to Group B strep  Perirectal abscess Sepsis - Surgical management per CCS - Plans for return to OR 6/7 - Appreciate ID recommendations. Ceftriaxone and metronidazole currently.   Acute respiratory failure with hypoxia: quickly desaturated after extubation in the PACU Worrisome for  development of ARDS, but did improve rather quickly with mild PEEP escalation Pulmonary edema - Full vent support - Fentanyl and propofol for RASS goal -2. Has been difficult to sedate. Precedex added overnight. Triglycerides high this morning so we will wean propofol as we are able.  - Defer SBT until after re-exploration 6/7.  - Optimize volume status in the interim, will gently diurese today.  -  Echocardiogram - Suspect there may be a degree of sleep/obesity disordered breathing contributing as well.   AKI: secondary to severe sepsis Hypokalemia Hypophosphatemia - improved - trend BMP - Replete Kphos - repeat labs this afternoon  DM type 1: Hgb A1C 12.3 on presentation.  Hypoglycemia - CBG monitoring and SSI q 4 hours - Holding home Toujeo - D5 > D10 infusion due to hypervolemia - Starting TF today.  - Will ultimately need outpatient referral to endocrinology  MDD/GAD - holding home clonazepam, escitalopram.    Best Practice (right click and "Reselect all SmartList Selections" daily)   Diet/type: NPO DVT prophylaxis: LMWH GI prophylaxis: H2B Lines: N/A Foley:  Yes, and it is still needed Code Status:  full code Last date of multidisciplinary goals of care discussion [ Family updated at bedside 6/5]  Labs   CBC: Recent Labs  Lab 06/29/22 1555 06/30/22 0842 07/01/22 0429 07/02/22 0816 07/03/22 0327  WBC 19.6* 21.1* 20.0* 16.7* 13.1*  NEUTROABS 16.9* 18.6* 17.2*  --   --   HGB 13.9 11.2* 11.0* 10.5* 9.6*  HCT 41.8 35.6* 33.6* 32.3* 29.7*  MCV 85.8 88.8 88.0 87.8 88.4  PLT 308 240 232 256 223     Basic Metabolic Panel: Recent Labs  Lab 06/30/22 0842 06/30/22 1521 07/01/22 0429 07/02/22 0421 07/02/22 0816 07/03/22 0327  NA 134* 135 138  --  137 138  K 4.1 4.1 3.7  --  3.7 3.4*  CL 100 103 105  --  106 108  CO2 18* 20* 23  --  19* 21*  GLUCOSE 258* 303* 180*  --  102* 176*  BUN 22* 24* 27*  --  22* 17  CREATININE 1.91* 2.04* 2.21* 1.57* 1.55* 1.22  CALCIUM 7.8* 7.4* 7.5*  --  7.1* 7.3*  MG  --   --   --   --   --  1.7  PHOS  --   --   --   --   --  1.7*    GFR: Estimated Creatinine Clearance: 100.1 mL/min (by C-G formula based on SCr of 1.22 mg/dL). Recent Labs  Lab 06/29/22 1906 06/29/22 2029 06/30/22 0842 06/30/22 1130 07/01/22 0429 07/02/22 0816 07/03/22 0327  WBC  --   --  21.1*  --  20.0* 16.7* 13.1*  LATICACIDVEN 1.0 2.3*  1.8 2.2*  --   --   --      Liver Function Tests: Recent Labs  Lab 06/29/22 1555 06/30/22 0842 07/01/22 0429 07/03/22 0327  AST 21 27 23 22   ALT 20 26 22 23   ALKPHOS 105 102 109 104  BILITOT 1.0 1.3* 0.5 0.5  PROT 7.3 6.0* 5.8* 5.4*  ALBUMIN 3.3* 2.6* 2.3* 2.1*    Recent Labs  Lab 06/29/22 1555  LIPASE 30    No results for input(s): "AMMONIA" in the last 168 hours.  ABG    Component Value Date/Time   PHART 7.47 (H) 07/03/2022 0341   PCO2ART 32 07/03/2022 0341   PO2ART 129 (H) 07/03/2022 0341   HCO3 23.3 07/03/2022 0341   TCO2 12.7 10/07/2011 2235  ACIDBASEDEF 2.7 (H) 07/02/2022 1554   O2SAT 99.8 07/03/2022 0341     Coagulation Profile: Recent Labs  Lab 06/29/22 2029  INR 1.1     Cardiac Enzymes: No results for input(s): "CKTOTAL", "CKMB", "CKMBINDEX", "TROPONINI" in the last 168 hours.  HbA1C: Hgb A1c MFr Bld  Date/Time Value Ref Range Status  06/30/2022 08:42 AM 12.3 (H) 4.8 - 5.6 % Final    Comment:    (NOTE)         Prediabetes: 5.7 - 6.4         Diabetes: >6.4         Glycemic control for adults with diabetes: <7.0   10/08/2011 03:00 AM 13.3 (H) <5.7 % Final    Comment:    (NOTE)                                                                       According to the ADA Clinical Practice Recommendations for 2011, when HbA1c is used as a screening test:  >=6.5%   Diagnostic of Diabetes Mellitus           (if abnormal result is confirmed) 5.7-6.4%   Increased risk of developing Diabetes Mellitus References:Diagnosis and Classification of Diabetes Mellitus,Diabetes Care,2011,34(Suppl 1):S62-S69 and Standards of Medical Care in         Diabetes - 2011,Diabetes Care,2011,34 (Suppl 1):S11-S61.    CBG: Recent Labs  Lab 07/02/22 2042 07/02/22 2304 07/02/22 2342 07/03/22 0223 07/03/22 0412  GLUCAP 126* 68* 145* 169* 167*     Review of Systems:   Patient is encephalopathic and/or intubated; therefore, history has been obtained from  chart review.    Past Medical History:  He,  has a past medical history of Diabetes mellitus.   Surgical History:   Past Surgical History:  Procedure Laterality Date   INCISION AND DRAINAGE ABSCESS     5-6 yrs ago, buttock   INCISION AND DRAINAGE PERIRECTAL ABSCESS N/A 06/30/2022   Procedure: IRRIGATION AND DEBRIDEMENT PERIRECTAL ABSCESS;  Surgeon: Emelia Loron, MD;  Location: [facility];  Service: General;  Laterality: N/A;   KNEE SURGERY       Social History:   reports that he has never smoked. He has never used smokeless tobacco. He reports current alcohol use. He reports that he does not use drugs.   Family History:  His family history includes Diabetes in an other family member.   Allergies Allergies  Allergen Reactions   Amoxicillin Rash   Levaquin [Levofloxacin] Rash     Home Medications  Prior to Admission medications   Medication Sig Start Date End Date Taking? Authorizing Provider  clonazePAM (KLONOPIN) 0.5 MG tablet Take 0.5 mg by mouth as needed for anxiety. 10/23/21  Yes [provider]  escitalopram (LEXAPRO) 5 MG tablet Take 1 tablet by mouth at bedtime. 06/25/22  Yes [provider]  ibuprofen (ADVIL) 200 MG tablet Take 400 mg by mouth every 6 (six) hours as needed for moderate pain.   Yes [provider]  Insulin Glargine (TOUJEO SOLOSTAR Simpson) Inject 60 Units into the skin at bedtime.   Yes [provider]  insulin lispro (HUMALOG) 100 UNIT/ML KwikPen Inject 10-20 Units into the skin in the morning, at noon, in the  evening, and at bedtime. Before meals sliding scale 05/28/21  Yes [provider]     Critical care time: 44 minutes     Joneen Roach, AGACNP-BC [facility]  See Amion for personal pager PCCM on call pager [phone] until 7pm. Please call Elink 7p-7a. [phone]  07/03/2022 7:32 AM

## 2022-07-03 NOTE — Progress Notes (Signed)
Initial Nutrition Assessment  DOCUMENTATION CODES:   Obesity unspecified  INTERVENTION:  - Per CCM starting tube feeds today. Recommend: Vital 1.5 at 60 ml/h (1440 ml per day) *Start at 48mL/hr and advance by 10mL Q6H to goal Prosource TF20 60 ml BID Provides 2320 kcal, 137 gm protein, 1100 ml free water daily  - Monitor magnesium, potassium, and phosphorus BID for at least 3 days, MD to replete as needed.  - FWF per CCM/MD.    NUTRITION DIAGNOSIS:   Inadequate oral intake related to inability to eat as evidenced by NPO status.  GOAL:   Patient will meet greater than or equal to 90% of their needs  MONITOR:   Vent status, Labs, TF tolerance  REASON FOR ASSESSMENT:   Consult Enteral/tube feeding initiation and management  ASSESSMENT:   32 y.o. male with PMH diabetes mellitus who presented with evolving buttocks abscess.  6/3 Admit, I&D 6/5 debridement of necrotizing infection, remained intubated post op  Patient intubated and sedated at time of visit, mom at bedside.   Mom reports patient has not had any changes in weight recently and was eating well PTA, usually 3 meals a day. Notes he didn't eat very much when on a diet this admission prior to intubation.   OGT xray verified in stomach. TF infusing at 34mL/hr during visit this afternoon. Per CCM okay to increase towards goal today.  Triglycerides elevated at 295 this AM so plan to wean propofol.  Plan for patient to return to OR tomorrow, deferring SBT til afterwards.   Medications reviewed and include: Colace, Miralax Fentanyl Levophed Propofol @ 21.22mL/hr (provides 557 kcals over 24 hours)  Labs reviewed:  K+ 3.4 Phosphorus 1.7 HA1C 12.3 Blood Glucose 30-194 x24 hours    NUTRITION - FOCUSED PHYSICAL EXAM:  Flowsheet Row Most Recent Value  Orbital Region No depletion  Upper Arm Region No depletion  Thoracic and Lumbar Region No depletion  Buccal Region Unable to assess  Temple Region No  depletion  Clavicle Bone Region No depletion  Clavicle and Acromion Bone Region No depletion  Scapular Bone Region Unable to assess  Dorsal Hand No depletion  Patellar Region No depletion  Anterior Thigh Region No depletion  Posterior Calf Region No depletion  Edema (RD Assessment) Mild  Hair Reviewed  Eyes Reviewed  Mouth Unable to assess  Skin Reviewed  Nails Reviewed       Diet Order:   Diet Order             Diet NPO time specified Except for: Sips with Meds  Diet effective midnight                   EDUCATION NEEDS:  Not appropriate for education at this time  Skin:  Skin Assessment: Skin Integrity Issues: Skin Integrity Issues:: Incisions Incisions: Left Buttocks  Last BM:  6/2  Height:  Ht Readings from Last 1 Encounters:  06/30/22 5\' 7"  (1.702 m)   Weight:  Wt Readings from Last 1 Encounters:  06/30/22 100.7 kg   Ideal Body Weight:  67.27 kg  BMI:  Body mass index is 34.77 kg/m.  Estimated Nutritional Needs:  Kcal:  2000-2350 kcals Protein:  120-150 grams Fluid:  >/= 2L    Shelle Iron RD, LDN For contact information, refer to [facility].

## 2022-07-03 NOTE — Progress Notes (Signed)
1 Day Post-Op   Subjective/Chief Complaint: sedated   Objective: Vital signs in last 24 hours: Temp:  [98.1 F (36.7 C)-102.7 F (39.3 C)] 98.4 F (36.9 C) (06/06 0400) Pulse Rate:  [67-134] 71 (06/06 0600) Resp:  [15-24] 24 (06/06 0600) BP: (86-152)/(48-84) 106/60 (06/06 0600) SpO2:  [89 %-100 %] 100 % (06/06 0600) FiO2 (%):  [40 %-100 %] 40 % (06/06 0335) Last BM Date : 06/29/22  Intake/Output from previous day: 06/05 0701 - 06/06 0700 In: 3592.4 [I.V.:2510; NG/GT:60; IV Piggyback:1022.5] Out: 1315 [Urine:1265; Blood:50] Intake/Output this shift: Total I/O In: 412.6 [I.V.:412.6] Out: -   Dressing intact   Lab Results:  Recent Labs    07/02/22 0816 07/03/22 0327  WBC 16.7* 13.1*  HGB 10.5* 9.6*  HCT 32.3* 29.7*  PLT 256 223   BMET Recent Labs    07/02/22 0816 07/03/22 0327  NA 137 138  K 3.7 3.4*  CL 106 108  CO2 19* 21*  GLUCOSE 102* 176*  BUN 22* 17  CREATININE 1.55* 1.22  CALCIUM 7.1* 7.3*   PT/INR No results for input(s): "LABPROT", "INR" in the last 72 hours. ABG Recent Labs    07/02/22 1554 07/03/22 0341  PHART 7.33* 7.47*  HCO3 22.6 23.3    Studies/Results: DG Chest Port 1 View  Result Date: 07/03/2022 CLINICAL DATA:  Hypoxia EXAM: PORTABLE CHEST 1 VIEW COMPARISON:  07/02/2022 FINDINGS: Endotracheal tube and gastric catheter are again seen and stable. Cardiac shadow is unchanged. Persistent changes of edema are noted bilaterally. No bony abnormality is seen. IMPRESSION: Diffuse pulmonary edema. Electronically Signed   By: Alcide Clever M.D.   On: 07/03/2022 03:06   DG Abd 1 View  Result Date: 07/02/2022 CLINICAL DATA:  Placement of NG tube EXAM: ABDOMEN - 1 VIEW COMPARISON:  01/17/2010 FINDINGS: Tip of NG tube is seen in the region of the antrum of the stomach. Bowel gas pattern is nonspecific. IMPRESSION: Tip of NG tube is seen in the antrum of the stomach. Electronically Signed   By: Ernie Avena M.D.   On: 07/02/2022 15:30   DG  CHEST PORT 1 VIEW  Result Date: 07/02/2022 CLINICAL DATA:  Difficulty breathing, endotracheal tube placement EXAM: PORTABLE CHEST 1 VIEW COMPARISON:  Previous studies including the examination done earlier today FINDINGS: Transverse diameter of heart is increased. Diffuse alveolar densities are seen in both lungs which was not seen on the previous examination. Lateral costophrenic angles are indistinct. There is no pneumothorax. Tip of endotracheal tube is 7.5 cm above the carina. NG tube is noted traversing the esophagus with its distal portion in the stomach. IMPRESSION: There is interval appearance of alveolar densities in both lungs suggesting pulmonary edema. Part of this finding may be due to atelectasis. Tip of endotracheal tube is in the lower neck 7.5 cm above the carina. Electronically Signed   By: Ernie Avena M.D.   On: 07/02/2022 15:30   DG CHEST PORT 1 VIEW  Result Date: 07/02/2022 CLINICAL DATA:  Hypoxia. EXAM: PORTABLE CHEST 1 VIEW COMPARISON:  October 05, 2014. FINDINGS: The heart size and mediastinal contours are within normal limits. Both lungs are clear. The visualized skeletal structures are unremarkable. IMPRESSION: No active disease. Electronically Signed   By: Lupita Raider M.D.   On: 07/02/2022 13:50    Anti-infectives: Anti-infectives (From admission, onward)    Start     Dose/Rate Route Frequency Ordered Stop   07/02/22 1545  metroNIDAZOLE (FLAGYL) IVPB 500 mg  500 mg 100 mL/hr over 60 Minutes Intravenous Every 12 hours 07/02/22 1452     07/02/22 1530  amoxicillin (AMOXIL) capsule 250 mg  Status:  Discontinued        250 mg Oral  Once 07/02/22 1430 07/02/22 1452   07/02/22 1530  metroNIDAZOLE (FLAGYL) tablet 500 mg  Status:  Discontinued        500 mg Oral Every 12 hours 07/02/22 1436 07/02/22 1452   07/02/22 1433  cefTRIAXone (ROCEPHIN) 2 g in sodium chloride 0.9 % 100 mL IVPB        2 g 200 mL/hr over 30 Minutes Intravenous Every 24 hours 07/02/22 1434      07/02/22 1430  metroNIDAZOLE (FLAGYL) IVPB 500 mg  Status:  Discontinued        500 mg 100 mL/hr over 60 Minutes Intravenous Every 12 hours 07/02/22 1400 07/02/22 1436   07/02/22 1200  amoxicillin (AMOXIL) capsule 500 mg  Status:  Discontinued        500 mg Oral  Once 07/02/22 1018 07/02/22 1430   07/02/22 1115  cefTRIAXone (ROCEPHIN) 2 g in sodium chloride 0.9 % 100 mL IVPB  Status:  Discontinued        2 g 200 mL/hr over 30 Minutes Intravenous Every 24 hours 07/02/22 1018 07/02/22 1434   07/01/22 2000  vancomycin (VANCOREADY) IVPB 1750 mg/350 mL  Status:  Discontinued        1,750 mg 175 mL/hr over 120 Minutes Intravenous Every 36 hours 06/30/22 1801 07/02/22 1018   07/01/22 0000  ceFEPIme (MAXIPIME) 2 g in sodium chloride 0.9 % 100 mL IVPB  Status:  Discontinued        2 g 200 mL/hr over 30 Minutes Intravenous Every 12 hours 06/30/22 1801 07/02/22 1018   06/30/22 2100  vancomycin (VANCOREADY) IVPB 750 mg/150 mL  Status:  Discontinued        750 mg 150 mL/hr over 60 Minutes Intravenous Every 12 hours 06/30/22 0718 06/30/22 1801   06/30/22 0800  vancomycin (VANCOREADY) IVPB 2000 mg/400 mL        2,000 mg 200 mL/hr over 120 Minutes Intravenous NOW 06/30/22 0705 06/30/22 1222   06/30/22 0800  ceFEPIme (MAXIPIME) 2 g in sodium chloride 0.9 % 100 mL IVPB  Status:  Discontinued        2 g 200 mL/hr over 30 Minutes Intravenous Every 8 hours 06/30/22 0708 06/30/22 1801   06/29/22 1800  cefTRIAXone (ROCEPHIN) 2 g in sodium chloride 0.9 % 100 mL IVPB        2 g 200 mL/hr over 30 Minutes Intravenous  Once 06/29/22 1747 06/29/22 1910       Assessment/Plan: POD 3 s/p I&D of perirectal abscess, POD 1 debridement NSTI -WBC down to 13 today -cont abx therapy -cx with Group B strep, ID has seen him -surprised with what appeared to be simple abscess initially with findings yesterday but this is NSTI, need to return to OR tomorrow as discussed with wife.   -appreciate ccm assistance and agree  with diuresis as tolerated   FEN -  NPO, tube feeds fine, if he starts having a lot of loose stools flexiseal is fine due to wound  VTE - Lovenox ID - maxipime, vanc   Type I diabetes   Emelia Loron 07/03/2022

## 2022-07-04 ENCOUNTER — Other Ambulatory Visit: Payer: Self-pay

## 2022-07-04 ENCOUNTER — Encounter (HOSPITAL_COMMUNITY): Payer: Self-pay | Admitting: Internal Medicine

## 2022-07-04 ENCOUNTER — Inpatient Hospital Stay (HOSPITAL_COMMUNITY): Payer: BLUE CROSS/BLUE SHIELD

## 2022-07-04 ENCOUNTER — Inpatient Hospital Stay (HOSPITAL_COMMUNITY): Payer: BLUE CROSS/BLUE SHIELD | Admitting: Anesthesiology

## 2022-07-04 ENCOUNTER — Encounter (HOSPITAL_COMMUNITY)
Admission: EM | Disposition: A | Discharge status: Home Health | Payer: Self-pay | Source: Home / Self Care | Attending: Family Medicine

## 2022-07-04 DIAGNOSIS — R9431 Abnormal electrocardiogram [ECG] [EKG]: Secondary | ICD-10-CM

## 2022-07-04 DIAGNOSIS — A419 Sepsis, unspecified organism: Secondary | ICD-10-CM | POA: Diagnosis not present

## 2022-07-04 DIAGNOSIS — N493 Fournier gangrene: Secondary | ICD-10-CM | POA: Diagnosis not present

## 2022-07-04 DIAGNOSIS — R0609 Other forms of dyspnea: Secondary | ICD-10-CM | POA: Diagnosis not present

## 2022-07-04 DIAGNOSIS — J9601 Acute respiratory failure with hypoxia: Secondary | ICD-10-CM | POA: Diagnosis not present

## 2022-07-04 DIAGNOSIS — R6520 Severe sepsis without septic shock: Secondary | ICD-10-CM | POA: Diagnosis not present

## 2022-07-04 DIAGNOSIS — L0231 Cutaneous abscess of buttock: Secondary | ICD-10-CM | POA: Diagnosis not present

## 2022-07-04 HISTORY — PX: INCISION AND DRAINAGE OF WOUND: SHX1803

## 2022-07-04 LAB — BLOOD GAS, VENOUS
Acid-base deficit: 2.2 mmol/L — ABNORMAL HIGH (ref 0.0–2.0)
Bicarbonate: 23.7 mmol/L (ref 20.0–28.0)
O2 Saturation: 82.8 %
Patient temperature: 37
pCO2, Ven: 44 mmHg (ref 44–60)
pH, Ven: 7.34 (ref 7.25–7.43)
pO2, Ven: 45 mmHg (ref 32–45)

## 2022-07-04 LAB — ECHOCARDIOGRAM COMPLETE
AR max vel: 2.7 cm2
AV Area VTI: 2.72 cm2
AV Area mean vel: 2.4 cm2
AV Mean grad: 7 mmHg
AV Peak grad: 12 mmHg
Ao pk vel: 1.74 m/s
Area-P 1/2: 3.56 cm2
Calc EF: 66.1 %
Height: 67 in
MV VTI: 3 cm2
S' Lateral: 3.1 cm
Single Plane A2C EF: 66 %
Single Plane A4C EF: 65.1 %
Weight: 3552.02 oz

## 2022-07-04 LAB — BASIC METABOLIC PANEL
Anion gap: 10 (ref 5–15)
BUN: 18 mg/dL (ref 6–20)
CO2: 22 mmol/L (ref 22–32)
Calcium: 7.5 mg/dL — ABNORMAL LOW (ref 8.9–10.3)
Chloride: 108 mmol/L (ref 98–111)
Creatinine, Ser: 1.18 mg/dL (ref 0.61–1.24)
GFR, Estimated: >60 mL/min (ref >60)
Glucose, Bld: 286 mg/dL — ABNORMAL HIGH (ref 70–99)
Potassium: 3.5 mmol/L (ref 3.5–5.1)
Sodium: 140 mmol/L (ref 135–145)

## 2022-07-04 LAB — CULTURE, RESPIRATORY W GRAM STAIN

## 2022-07-04 LAB — GLUCOSE, CAPILLARY
Glucose-Capillary: 230 mg/dL — ABNORMAL HIGH (ref 70–99)
Glucose-Capillary: 275 mg/dL — ABNORMAL HIGH (ref 70–99)
Glucose-Capillary: 258 mg/dL — ABNORMAL HIGH (ref 70–99)
Glucose-Capillary: 202 mg/dL — ABNORMAL HIGH (ref 70–99)
Glucose-Capillary: 122 mg/dL — ABNORMAL HIGH (ref 70–99)

## 2022-07-04 LAB — CBC
HCT: 28 % — ABNORMAL LOW (ref 39.0–52.0)
Hemoglobin: 8.9 g/dL — ABNORMAL LOW (ref 13.0–17.0)
MCH: 28.5 pg (ref 26.0–34.0)
MCHC: 31.8 g/dL (ref 30.0–36.0)
MCV: 89.7 fL (ref 80.0–100.0)
Platelets: 281 10*3/uL (ref 150–400)
RBC: 3.12 MIL/uL — ABNORMAL LOW (ref 4.22–5.81)
RDW: 15.3 % (ref 11.5–15.5)
WBC: 13.4 10*3/uL — ABNORMAL HIGH (ref 4.0–10.5)
nRBC: 0 % (ref 0.0–0.2)

## 2022-07-04 LAB — AEROBIC/ANAEROBIC CULTURE W GRAM STAIN (SURGICAL/DEEP WOUND): Gram Stain: NONE SEEN

## 2022-07-04 LAB — CULTURE, BLOOD (ROUTINE X 2)
Culture: NO GROWTH
Special Requests: ADEQUATE

## 2022-07-04 LAB — MAGNESIUM
Magnesium: 2.1 mg/dL (ref 1.7–2.4)
Magnesium: 1.7 mg/dL (ref 1.7–2.4)

## 2022-07-04 LAB — PHOSPHORUS
Phosphorus: 3 mg/dL (ref 2.5–4.6)
Phosphorus: 2.9 mg/dL (ref 2.5–4.6)

## 2022-07-04 SURGERY — IRRIGATION AND DEBRIDEMENT WOUND
Anesthesia: General

## 2022-07-04 MED ORDER — PROPOFOL 10 MG/ML IV BOLUS
INTRAVENOUS | Status: AC
  Filled 2022-07-04: qty 20

## 2022-07-04 MED ORDER — OXYCODONE HCL 5 MG PO TABS: 5.0000 mg | ORAL_TABLET | Freq: Once | ORAL | Status: DC | PRN

## 2022-07-04 MED ORDER — POLYETHYLENE GLYCOL 3350 17 G PO PACK: 17.0000 g | PACK | Freq: Every day | ORAL | Status: DC

## 2022-07-04 MED ORDER — FENTANYL CITRATE PF 50 MCG/ML IJ SOSY
50.0000 ug | PREFILLED_SYRINGE | Freq: Once | INTRAMUSCULAR | Status: AC
  Administered 2022-07-04: 50 ug via INTRAVENOUS

## 2022-07-04 MED ORDER — DOCUSATE SODIUM 50 MG/5ML PO LIQD: 100.0000 mg | Freq: Two times a day (BID) | ORAL | Status: DC

## 2022-07-04 MED ORDER — 0.9 % SODIUM CHLORIDE (POUR BTL) OPTIME
TOPICAL | Status: DC | PRN
  Administered 2022-07-04: 1000 mL

## 2022-07-04 MED ORDER — HYDRALAZINE HCL 20 MG/ML IJ SOLN
INTRAMUSCULAR | Status: AC
  Filled 2022-07-04: qty 1

## 2022-07-04 MED ORDER — PROPOFOL 1000 MG/100ML IV EMUL
0.0000 ug/kg/min | INTRAVENOUS | Status: DC
  Filled 2022-07-04: qty 100

## 2022-07-04 MED ORDER — DEXAMETHASONE SODIUM PHOSPHATE 10 MG/ML IJ SOLN
INTRAMUSCULAR | Status: AC
  Filled 2022-07-04: qty 1

## 2022-07-04 MED ORDER — DEXMEDETOMIDINE HCL IN NACL 400 MCG/100ML IV SOLN
0.0000 ug/kg/h | INTRAVENOUS | Status: DC
  Administered 2022-07-04 (×2): 0.4 ug/kg/h via INTRAVENOUS
  Administered 2022-07-04: 0.5 ug/kg/h via INTRAVENOUS
  Administered 2022-07-04: 0.6 ug/kg/h via INTRAVENOUS
  Filled 2022-07-04 (×3): qty 100

## 2022-07-04 MED ORDER — ONDANSETRON HCL 4 MG/2ML IJ SOLN
INTRAMUSCULAR | Status: AC
  Filled 2022-07-04: qty 2

## 2022-07-04 MED ORDER — ACETAMINOPHEN 160 MG/5ML PO SOLN
1000.0000 mg | Freq: Four times a day (QID) | ORAL | Status: DC
  Administered 2022-07-04: 1000 mg via ORAL
  Filled 2022-07-04 (×2): qty 40.6

## 2022-07-04 MED ORDER — LIDOCAINE HCL (PF) 2 % IJ SOLN
INTRAMUSCULAR | Status: AC
  Filled 2022-07-04: qty 5

## 2022-07-04 MED ORDER — MIDAZOLAM HCL 2 MG/2ML IJ SOLN
INTRAMUSCULAR | Status: AC
  Filled 2022-07-04: qty 2

## 2022-07-04 MED ORDER — FENTANYL CITRATE (PF) 100 MCG/2ML IJ SOLN
INTRAMUSCULAR | Status: AC
  Filled 2022-07-04: qty 2

## 2022-07-04 MED ORDER — PROPOFOL 1000 MG/100ML IV EMUL
INTRAVENOUS | Status: AC
  Administered 2022-07-04: 5 ug/kg/min via INTRAVENOUS
  Filled 2022-07-04: qty 100

## 2022-07-04 MED ORDER — ESMOLOL HCL 100 MG/10ML IV SOLN
INTRAVENOUS | Status: AC
  Filled 2022-07-04: qty 10

## 2022-07-04 MED ORDER — ONDANSETRON HCL 4 MG/2ML IJ SOLN: 4.0000 mg | Freq: Once | INTRAMUSCULAR | Status: DC | PRN

## 2022-07-04 MED ORDER — INSULIN ASPART 100 UNIT/ML IJ SOLN
0.0000 [IU] | Freq: Three times a day (TID) | INTRAMUSCULAR | Status: DC
  Administered 2022-07-05: 3 [IU] via SUBCUTANEOUS
  Administered 2022-07-05: 4 [IU] via SUBCUTANEOUS
  Administered 2022-07-05: 7 [IU] via SUBCUTANEOUS
  Administered 2022-07-06: 11 [IU] via SUBCUTANEOUS
  Administered 2022-07-06: 7 [IU] via SUBCUTANEOUS
  Administered 2022-07-06: 11 [IU] via SUBCUTANEOUS
  Administered 2022-07-07: 7 [IU] via SUBCUTANEOUS
  Administered 2022-07-07: 4 [IU] via SUBCUTANEOUS
  Administered 2022-07-07 – 2022-07-08 (×3): 7 [IU] via SUBCUTANEOUS
  Administered 2022-07-08: 11 [IU] via SUBCUTANEOUS
  Administered 2022-07-09: 7 [IU] via SUBCUTANEOUS
  Administered 2022-07-09: 11 [IU] via SUBCUTANEOUS

## 2022-07-04 MED ORDER — SODIUM CHLORIDE 0.9 % IR SOLN
Status: DC | PRN
  Administered 2022-07-04: 1000 mL

## 2022-07-04 MED ORDER — MIDAZOLAM HCL 5 MG/5ML IJ SOLN
INTRAMUSCULAR | Status: DC | PRN
  Administered 2022-07-04 (×2): .5 mg via INTRAVENOUS
  Administered 2022-07-04: 1 mg via INTRAVENOUS

## 2022-07-04 MED ORDER — ACETAMINOPHEN 500 MG PO TABS
1000.0000 mg | ORAL_TABLET | Freq: Four times a day (QID) | ORAL | Status: DC
  Administered 2022-07-04 – 2022-07-06 (×6): 1000 mg via ORAL
  Filled 2022-07-04 (×6): qty 2

## 2022-07-04 MED ORDER — POTASSIUM CHLORIDE 10 MEQ/50ML IV SOLN
10.0000 meq | INTRAVENOUS | Status: AC
  Administered 2022-07-04 (×5): 10 meq via INTRAVENOUS
  Filled 2022-07-04 (×5): qty 50

## 2022-07-04 MED ORDER — ROCURONIUM BROMIDE 10 MG/ML (PF) SYRINGE
PREFILLED_SYRINGE | INTRAVENOUS | Status: AC
  Filled 2022-07-04: qty 10

## 2022-07-04 MED ORDER — FENTANYL CITRATE (PF) 100 MCG/2ML IJ SOLN
INTRAMUSCULAR | Status: DC | PRN
  Administered 2022-07-04: 50 ug via INTRAVENOUS
  Administered 2022-07-04: 25 ug via INTRAVENOUS
  Administered 2022-07-04 (×2): 50 ug via INTRAVENOUS
  Administered 2022-07-04: 25 ug via INTRAVENOUS

## 2022-07-04 MED ORDER — ONDANSETRON HCL 4 MG/2ML IJ SOLN
INTRAMUSCULAR | Status: DC | PRN
  Administered 2022-07-04: 4 mg via INTRAVENOUS

## 2022-07-04 MED ORDER — FENTANYL BOLUS VIA INFUSION
50.0000 ug | INTRAVENOUS | Status: DC | PRN
  Administered 2022-07-04 (×2): 100 ug via INTRAVENOUS
  Administered 2022-07-04 (×2): 50 ug via INTRAVENOUS
  Administered 2022-07-04 (×2): 100 ug via INTRAVENOUS

## 2022-07-04 MED ORDER — LIDOCAINE HCL (CARDIAC) PF 100 MG/5ML IV SOSY
PREFILLED_SYRINGE | INTRAVENOUS | Status: DC | PRN
  Administered 2022-07-04: 80 mg via INTRAVENOUS

## 2022-07-04 MED ORDER — ACETAMINOPHEN 160 MG/5ML PO SOLN: 325.0000 mg | ORAL | Status: DC | PRN

## 2022-07-04 MED ORDER — AMISULPRIDE (ANTIEMETIC) 5 MG/2ML IV SOLN: 10.0000 mg | Freq: Once | INTRAVENOUS | Status: DC | PRN

## 2022-07-04 MED ORDER — INSULIN DETEMIR 100 UNIT/ML ~~LOC~~ SOLN
22.0000 [IU] | Freq: Two times a day (BID) | SUBCUTANEOUS | Status: DC
  Filled 2022-07-04: qty 0.22

## 2022-07-04 MED ORDER — OXYCODONE HCL 5 MG/5ML PO SOLN: 5.0000 mg | Freq: Once | ORAL | Status: DC | PRN

## 2022-07-04 MED ORDER — FENTANYL 2500MCG IN NS 250ML (10MCG/ML) PREMIX INFUSION
INTRAVENOUS | Status: AC
  Administered 2022-07-04: 50 ug/h via INTRAVENOUS
  Filled 2022-07-04: qty 250

## 2022-07-04 MED ORDER — ESMOLOL HCL 100 MG/10ML IV SOLN
INTRAVENOUS | Status: DC | PRN
  Administered 2022-07-04: 20 mg via INTRAVENOUS

## 2022-07-04 MED ORDER — INSULIN DETEMIR 100 UNIT/ML ~~LOC~~ SOLN: 15.0000 [IU] | Freq: Two times a day (BID) | SUBCUTANEOUS | Status: DC

## 2022-07-04 MED ORDER — ROCURONIUM BROMIDE 100 MG/10ML IV SOLN
INTRAVENOUS | Status: DC | PRN
  Administered 2022-07-04: 50 mg via INTRAVENOUS
  Administered 2022-07-04: 20 mg via INTRAVENOUS

## 2022-07-04 MED ORDER — ACETAMINOPHEN 325 MG PO TABS: 325.0000 mg | ORAL_TABLET | ORAL | Status: DC | PRN

## 2022-07-04 MED ORDER — FENTANYL CITRATE PF 50 MCG/ML IJ SOSY: 25.0000 ug | PREFILLED_SYRINGE | INTRAMUSCULAR | Status: DC | PRN

## 2022-07-04 MED ORDER — DOCUSATE SODIUM 100 MG PO CAPS: 100.0000 mg | ORAL_CAPSULE | Freq: Two times a day (BID) | ORAL | Status: DC

## 2022-07-04 MED ORDER — FAMOTIDINE 20 MG PO TABS
20.0000 mg | ORAL_TABLET | Freq: Two times a day (BID) | ORAL | Status: DC
  Administered 2022-07-04 – 2022-07-10 (×12): 20 mg via ORAL
  Filled 2022-07-04 (×12): qty 1

## 2022-07-04 MED ORDER — INSULIN DETEMIR 100 UNIT/ML ~~LOC~~ SOLN
15.0000 [IU] | Freq: Two times a day (BID) | SUBCUTANEOUS | Status: DC
  Administered 2022-07-04 – 2022-07-06 (×4): 15 [IU] via SUBCUTANEOUS
  Filled 2022-07-04 (×5): qty 0.15

## 2022-07-04 MED ORDER — FUROSEMIDE 10 MG/ML IJ SOLN
40.0000 mg | Freq: Once | INTRAMUSCULAR | Status: AC
  Administered 2022-07-04: 40 mg via INTRAVENOUS
  Filled 2022-07-04: qty 4

## 2022-07-04 MED ORDER — INSULIN ASPART 100 UNIT/ML IJ SOLN
0.0000 [IU] | Freq: Every day | INTRAMUSCULAR | Status: DC
  Administered 2022-07-06: 3 [IU] via SUBCUTANEOUS
  Administered 2022-07-08: 2 [IU] via SUBCUTANEOUS

## 2022-07-04 MED ORDER — PROPOFOL 10 MG/ML IV BOLUS
INTRAVENOUS | Status: DC | PRN
  Administered 2022-07-04: 140 mg via INTRAVENOUS

## 2022-07-04 MED ORDER — MEPERIDINE HCL 50 MG/ML IJ SOLN: 6.2500 mg | INTRAMUSCULAR | Status: DC | PRN

## 2022-07-04 MED ORDER — FENTANYL 2500MCG IN NS 250ML (10MCG/ML) PREMIX INFUSION: 50.0000 ug/h | INTRAVENOUS | Status: DC

## 2022-07-04 MED ORDER — HYDROMORPHONE HCL 1 MG/ML IJ SOLN: 0.2500 mg | INTRAMUSCULAR | Status: DC | PRN

## 2022-07-04 SURGICAL SUPPLY — 35 items
BAG COUNTER SPONGE SURGICOUNT (BAG) IMPLANT
BAG SPNG CNTER NS LX DISP (BAG)
BLADE HEX COATED 2.75 (ELECTRODE) ×1 IMPLANT
BLADE SURG 15 STRL LF DISP TIS (BLADE) ×1 IMPLANT
BLADE SURG 15 STRL SS (BLADE) ×1
BNDG GZE 12X3 1 PLY HI ABS (GAUZE/BANDAGES/DRESSINGS) ×1
BNDG STRETCH GAUZE 3IN X12FT (GAUZE/BANDAGES/DRESSINGS) IMPLANT
COVER SURGICAL LIGHT HANDLE (MISCELLANEOUS) ×1 IMPLANT
ELECT REM PT RETURN 15FT ADLT (MISCELLANEOUS) ×1 IMPLANT
GAUZE 4X4 16PLY ~~LOC~~+RFID DBL (SPONGE) ×1 IMPLANT
GAUZE PACKING IODOFORM 1/4X15 (PACKING) IMPLANT
GAUZE PAD ABD 8X10 STRL (GAUZE/BANDAGES/DRESSINGS) IMPLANT
GAUZE SPONGE 4X4 12PLY STRL (GAUZE/BANDAGES/DRESSINGS) ×1 IMPLANT
GLOVE BIO SURGEON STRL SZ7 (GLOVE) ×2 IMPLANT
GLOVE BIOGEL PI IND STRL 7.0 (GLOVE) ×1 IMPLANT
GLOVE BIOGEL PI IND STRL 7.5 (GLOVE) ×2 IMPLANT
GOWN STRL REUS W/ TWL LRG LVL3 (GOWN DISPOSABLE) ×2 IMPLANT
GOWN STRL REUS W/ TWL XL LVL3 (GOWN DISPOSABLE) ×1 IMPLANT
GOWN STRL REUS W/TWL LRG LVL3 (GOWN DISPOSABLE) ×2
GOWN STRL REUS W/TWL XL LVL3 (GOWN DISPOSABLE) ×1
KIT BASIN OR (CUSTOM PROCEDURE TRAY) ×1 IMPLANT
KIT TURNOVER KIT A (KITS) IMPLANT
NDL HYPO 25X1 1.5 SAFETY (NEEDLE) IMPLANT
NEEDLE HYPO 25X1 1.5 SAFETY (NEEDLE) IMPLANT
PACK LITHOTOMY IV (CUSTOM PROCEDURE TRAY) ×1 IMPLANT
PENCIL SMOKE EVACUATOR (MISCELLANEOUS) IMPLANT
SOL SCRUB PVP POV-IOD 4OZ 7.5% (MISCELLANEOUS) ×1
SOLUTION SCRB POV-IOD 4OZ 7.5% (MISCELLANEOUS) ×1 IMPLANT
SURGILUBE 2OZ TUBE FLIPTOP (MISCELLANEOUS) ×1 IMPLANT
SUT CHROMIC 2 0 SH (SUTURE) IMPLANT
SUT CHROMIC 3 0 SH 27 (SUTURE) IMPLANT
SWAB COLLECTION DEVICE MRSA (MISCELLANEOUS) IMPLANT
SYR CONTROL 10ML LL (SYRINGE) IMPLANT
TOWEL OR 17X26 10 PK STRL BLUE (TOWEL DISPOSABLE) ×1 IMPLANT
UNDERPAD 30X36 HEAVY ABSORB (UNDERPADS AND DIAPERS) IMPLANT

## 2022-07-04 NOTE — Progress Notes (Signed)
eLink Physician-Brief Progress Note Patient Name: [name] DOB: 1990-06-12 MRN: [id number]   Date of Service  07/04/2022  HPI/Events of Note  32 year old male with a history of type 1 diabetes mellitus and obesity who initially presented with perirectal abscess and underwent operative debridement found to have Fournier's gangrene and severe sepsis. He was left intubated with the intent to return to the operating room 6/7 and repeat 6/8.  He is no longer receiving tube feedings, has a diet but only taking in water/ice  Has been persistently hyperglycemic prior to this.  CBG 122 at 2137  eICU Interventions  Will administer long-acting insulin, although I reduce the dose from 23 units to 15 units twice daily.  May need escalation once he starts eating.     Intervention Category Intermediate Interventions: Hyperglycemia - evaluation and treatment  [name] 07/04/2022, 10:24 PM

## 2022-07-04 NOTE — Progress Notes (Signed)
NAME:  [name], MRN:  [id number], DOB:  1990-12-04, LOS: 4 ADMISSION DATE:  06/29/2022, CONSULTATION DATE:  6/5 REFERRING MD:  Dr. Isidoro Donning, CHIEF COMPLAINT:  Fournier's Gangrene    History of Present Illness:  Patient is encephalopathic and/or intubated; therefore, history has been obtained from chart review.  32 year old male with past medical history as below, which is significant for poorly controlled diabetes and obesity.  He presented to urgent care on 6/2 with complaints of an evolving abscess on his buttock and fevers as high as 103.  Urgent care referred him to [facility] emergency department where he was further worked up, including an unsuccessful I&D attempt, however, he was sent to [facility] for admission and CT scan.  CT scan did not describe any soft tissue changes.  Surgery was consulted for evaluation of perirectal abscess and deemed him a surgical candidate for incision and drainage.  This was done in the operating room on 6/3 and culture was sent which went on to grow group B strep.  On 6/5 evolving erythema was noted to the area and he was taken back to the operating room for further exploration.  Ultimately was found to have necrotizing infection and underwent extensive debridement.  Postoperatively he was extubated, but quickly desaturated and was reintubated.  He was transferred to the intensive care unit where PCCM was consulted for ventilator management.  Pertinent  Medical History   has a past medical history of Diabetes mellitus.  Significant Hospital Events: Including procedures, antibiotic start and stop dates in addition to other pertinent events   6/2 presented for buttock abscess 6/3 to OR for I&D, cultures positive for Group B strep  6/5 back to OR for debridement of necrotizing infection. Post op on vent.  6/6 Episode of hypoglycemia, 11L positive for admission  6/7 Self extubated overnight, remains on low dose Precedex and Levo this am   Interim  History / Subjective:  Sleepy but easily arousable Wife and father at bedside and update    Objective   Blood pressure (!) 105/44, pulse 98, temperature 98.3 F (36.8 C), temperature source Oral, resp. rate (!) 21, height 5\' 7"  (1.702 m), weight 100.7 kg, SpO2 93 %.    Vent Mode: PRVC FiO2 (%):  [35 %-60 %] 40 % Set Rate:  [20 bmp-24 bmp] 20 bmp Vt Set:  [520 mL] 520 mL PEEP:  [8 cmH20] 8 cmH20 Plateau Pressure:  [22 cmH20-26 cmH20] 23 cmH20   Intake/Output Summary (Last 24 hours) at 07/04/2022 0703 Last data filed at 07/04/2022 0500 Gross per 24 hour  Intake 2972.82 ml  Output 3525 ml  Net -552.18 ml    Filed Weights   06/29/22 1532 06/30/22 1232  Weight: 100.7 kg 100.7 kg   Examination: General: Acute ill appearing adult male lying in bed, in NAD HEENT: [place]/AT, MM pink/moist, PERRL,  Neuro: Alert and oriented x3 CV: s1s2 regular rate and rhythm, no murmur, rubs, or gallops,  PULM:  Faint crackles to bases, no increased work of breathing on [place], no added breath sounds  GI: soft, bowel sounds active in all 4 quadrants, non-tender, non-distended Extremities: warm/dry, no edema  Skin: large wound to buttocks and perineum covered in dry dressing  Resolved Hospital Problem list   Hypoglycemia   Assessment & Plan:   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 Self extubated overnight 6/6 Pulmonary edema P: Head of bed elevated 30 degrees Likely to return from OR intubated  Follow intermittent chest x-ray and ABG Ensure adequate pulmonary  hygiene  Follow cultures  Diurese as able   AKI - Improved  -Secondary to severe sepsis Hypokalemia Hypophosphatemia P: Follow renal function  Monitor urine output Trend Bmet Avoid nephrotoxins Ensure adequate renal perfusion  Optimize electrolytes   DM type 1 -Hgb A1C 12.3 on presentation -Home medications include Lispro and Toujeo Hypoglycemia P: CBG check q4 Hold home Toujeo CBG goal 140-180 Would benefit from outpatient referral to endocrinology   MDD/GAD P: Hold home Clonazepam and Escitalopram   Best Practice (right click and "Reselect all SmartList Selections" daily)   Diet/type: NPO DVT prophylaxis: LMWH GI prophylaxis: H2B Lines: N/A Foley:  Yes, and it is still needed Code Status:  full code Last date of multidisciplinary goals of care discussion Patient and family updated at bedside   Critical care time:    Performed by: Kimber Esterly D. Harris  Total critical care time: 37 minutes  Critical care time was exclusive of separately billable procedures and treating other patients.  Critical care was necessary to treat or prevent imminent or life-threatening deterioration.  Critical care was time spent personally by me on the following activities: development of treatment plan with patient and/or surrogate as well as nursing, discussions with consultants, evaluation of patient's response to treatment, examination of patient, obtaining history from patient or surrogate, ordering and performing treatments and interventions, ordering and review of laboratory studies, ordering and review of radiographic studies, pulse oximetry and re-evaluation of patient's condition.  Ronell Duffus D. Harris, NP-C [facility] Personal contact information can be found on Amion  If no contact or response made please call [phone] 07/04/2022, 7:17 AM

## 2022-07-04 NOTE — Anesthesia Procedure Notes (Signed)
Procedure Name: Intubation Date/Time: 07/04/2022 9:47 AM  Performed by: Garth Bigness, CRNAPre-anesthesia Checklist: Patient identified, Emergency Drugs available, Suction available and Patient being monitored Patient Re-evaluated:Patient Re-evaluated prior to induction Oxygen Delivery Method: Circle system utilized Preoxygenation: Pre-oxygenation with 100% oxygen Induction Type: IV induction Ventilation: Mask ventilation without difficulty Laryngoscope Size: Mac and 4 Grade View: Grade I Tube type: Oral Tube size: 7.5 mm Number of attempts: 1 Airway Equipment and Method: Stylet and Oral airway Placement Confirmation: ETT inserted through vocal cords under direct vision, positive ETCO2 and breath sounds checked- equal and bilateral Secured at: 23 cm Tube secured with: Tape Dental Injury: Teeth and Oropharynx as per pre-operative assessment

## 2022-07-04 NOTE — Progress Notes (Signed)
07/04/2022 Patient's wife called out stating that patient reported that his bowels were moving. Found that patient had a large incontinent loose stool.  Abd pads were removed due to soiling with loose stool. It was then found that the entire packing, except for the section at the most anterior upper portion of the wound, was completely saturated with liquid stool. After conferring with ICU charge nurse, all packing was removed due to extensive soiling. Wound was irrigated with NS to clean stool away as much as possible. Wound was then covered with abd pads. Short Stay nurse, [place], was notified of the above. Short Stay charge nurse was aware and requested that we contact on-call surgeon regarding the above. Page placed to Dr. Luisa Hart at [phone]. Cindy S. Clelia Croft BSN, RN, [facility] 07/04/2022 6:57 AM

## 2022-07-04 NOTE — Transfer of Care (Signed)
Immediate Anesthesia Transfer of Care Note  Patient: [name]  Procedure(s) Performed: IRRIGATION AND DEBRIDEMENT WOUND, DRESSING CHANGE  Patient Location: PACU and ICU  Anesthesia Type:General  Level of Consciousness: sedated and Patient remains intubated per anesthesia plan  Airway & Oxygen Therapy: Patient remains intubated per anesthesia plan  Post-op Assessment: Report given to RN and Post -op Vital signs reviewed and stable  Post vital signs: Reviewed and stable  Last Vitals:  Vitals Value Taken Time  BP 148/88 07/04/22 1051  Temp    Pulse    Resp 25 07/04/22 1054  SpO2 98 % 07/04/22 1050  Vitals shown include unvalidated device data.  Last Pain:  Vitals:   07/04/22 0800  TempSrc:   PainSc: 3       Patients Stated Pain Goal: 3 (07/02/22 0936)  Complications: No notable events documented.

## 2022-07-04 NOTE — Progress Notes (Signed)
This nurse performed swallow eval with this patient. Patient given water to drink; this nurse did not observe any distress/signs of aspiration. Advanced diet to clear liquid per order as tolerated.

## 2022-07-04 NOTE — Anesthesia Postprocedure Evaluation (Signed)
Anesthesia Post Note  Patient: [name]  Procedure(s) Performed: IRRIGATION AND DEBRIDEMENT WOUND, DRESSING CHANGE     Patient location during evaluation: PACU Anesthesia Type: General Level of consciousness: awake and alert Pain management: pain level controlled Vital Signs Assessment: post-procedure vital signs reviewed and stable Respiratory status: spontaneous breathing, nonlabored ventilation, respiratory function stable and patient connected to nasal cannula oxygen Cardiovascular status: blood pressure returned to baseline and stable Postop Assessment: no apparent nausea or vomiting Anesthetic complications: no   No notable events documented.  Last Vitals:  Vitals:   07/04/22 0900 07/04/22 1050  BP: 138/67   Pulse: 86   Resp: (!) 31   Temp:    SpO2: 92% 98%    Last Pain:  Vitals:   07/04/22 0800  TempSrc:   PainSc: 3                  Cosmo Tetreault

## 2022-07-04 NOTE — Progress Notes (Signed)
2 Days Post-Op   Subjective/Chief Complaint: Extubated this am, bm overnight very loose   Objective: Vital signs in last 24 hours: Temp:  [98.3 F (36.8 C)-98.7 F (37.1 C)] 98.3 F (36.8 C) (06/07 0445) Pulse Rate:  [65-99] 83 (06/07 0645) Resp:  [16-31] 30 (06/07 0645) BP: (85-128)/(41-66) 128/66 (06/07 0645) SpO2:  [85 %-100 %] 92 % (06/07 0645) FiO2 (%):  [35 %-60 %] 40 % (06/06 2014) Last BM Date : 06/29/22  Intake/Output from previous day: 06/06 0701 - 06/07 0700 In: 2972.8 [I.V.:1620.9; ZO/XW:960.4; IV Piggyback:707.6] Out: 3525 [Urine:3525] Intake/Output this shift: No intake/output data recorded.  Wound to be evaluated in the OR today  Lab Results:  Recent Labs    07/03/22 0327 07/04/22 0401  WBC 13.1* 13.4*  HGB 9.6* 8.9*  HCT 29.7* 28.0*  PLT 223 281   BMET Recent Labs    07/03/22 1559 07/04/22 0559  NA 137 140  K 4.7 3.5  CL 106 108  CO2 22 22  GLUCOSE 347* 286*  BUN 17 18  CREATININE 1.29* 1.18  CALCIUM 7.4* 7.5*   PT/INR No results for input(s): "LABPROT", "INR" in the last 72 hours. ABG Recent Labs    07/02/22 1554 07/03/22 0341  PHART 7.33* 7.47*  HCO3 22.6 23.3    Studies/Results: [place] EKG SITE RITE  Result Date: 07/03/2022 If Site Rite image not attached, placement could not be confirmed due to current cardiac rhythm.  DG Chest Port 1 View  Result Date: 07/03/2022 CLINICAL DATA:  Hypoxia EXAM: PORTABLE CHEST 1 VIEW COMPARISON:  07/02/2022 FINDINGS: Endotracheal tube and gastric catheter are again seen and stable. Cardiac shadow is unchanged. Persistent changes of edema are noted bilaterally. No bony abnormality is seen. IMPRESSION: Diffuse pulmonary edema. Electronically Signed   By: Alcide Clever M.D.   On: 07/03/2022 03:06   DG Abd 1 View  Result Date: 07/02/2022 CLINICAL DATA:  Placement of NG tube EXAM: ABDOMEN - 1 VIEW COMPARISON:  01/17/2010 FINDINGS: Tip of NG tube is seen in the region of the antrum of the stomach. Bowel  gas pattern is nonspecific. IMPRESSION: Tip of NG tube is seen in the antrum of the stomach. Electronically Signed   By: Ernie Avena M.D.   On: 07/02/2022 15:30   DG CHEST PORT 1 VIEW  Result Date: 07/02/2022 CLINICAL DATA:  Difficulty breathing, endotracheal tube placement EXAM: PORTABLE CHEST 1 VIEW COMPARISON:  Previous studies including the examination done earlier today FINDINGS: Transverse diameter of heart is increased. Diffuse alveolar densities are seen in both lungs which was not seen on the previous examination. Lateral costophrenic angles are indistinct. There is no pneumothorax. Tip of endotracheal tube is 7.5 cm above the carina. NG tube is noted traversing the esophagus with its distal portion in the stomach. IMPRESSION: There is interval appearance of alveolar densities in both lungs suggesting pulmonary edema. Part of this finding may be due to atelectasis. Tip of endotracheal tube is in the lower neck 7.5 cm above the carina. Electronically Signed   By: Ernie Avena M.D.   On: 07/02/2022 15:30   DG CHEST PORT 1 VIEW  Result Date: 07/02/2022 CLINICAL DATA:  Hypoxia. EXAM: PORTABLE CHEST 1 VIEW COMPARISON:  October 05, 2014. FINDINGS: The heart size and mediastinal contours are within normal limits. Both lungs are clear. The visualized skeletal structures are unremarkable. IMPRESSION: No active disease. Electronically Signed   By: Lupita Raider M.D.   On: 07/02/2022 13:50    Anti-infectives: Anti-infectives (  From admission, onward)    Start     Dose/Rate Route Frequency Ordered Stop   07/02/22 1545  metroNIDAZOLE (FLAGYL) IVPB 500 mg        500 mg 100 mL/hr over 60 Minutes Intravenous Every 12 hours 07/02/22 1452     07/02/22 1530  amoxicillin (AMOXIL) capsule 250 mg  Status:  Discontinued        250 mg Oral  Once 07/02/22 1430 07/02/22 1452   07/02/22 1530  metroNIDAZOLE (FLAGYL) tablet 500 mg  Status:  Discontinued        500 mg Oral Every 12 hours 07/02/22 1436  07/02/22 1452   07/02/22 1433  cefTRIAXone (ROCEPHIN) 2 g in sodium chloride 0.9 % 100 mL IVPB        2 g 200 mL/hr over 30 Minutes Intravenous Every 24 hours 07/02/22 1434     07/02/22 1430  metroNIDAZOLE (FLAGYL) IVPB 500 mg  Status:  Discontinued        500 mg 100 mL/hr over 60 Minutes Intravenous Every 12 hours 07/02/22 1400 07/02/22 1436   07/02/22 1200  amoxicillin (AMOXIL) capsule 500 mg  Status:  Discontinued        500 mg Oral  Once 07/02/22 1018 07/02/22 1430   07/02/22 1115  cefTRIAXone (ROCEPHIN) 2 g in sodium chloride 0.9 % 100 mL IVPB  Status:  Discontinued        2 g 200 mL/hr over 30 Minutes Intravenous Every 24 hours 07/02/22 1018 07/02/22 1434   07/01/22 2000  vancomycin (VANCOREADY) IVPB 1750 mg/350 mL  Status:  Discontinued        1,750 mg 175 mL/hr over 120 Minutes Intravenous Every 36 hours 06/30/22 1801 07/02/22 1018   07/01/22 0000  ceFEPIme (MAXIPIME) 2 g in sodium chloride 0.9 % 100 mL IVPB  Status:  Discontinued        2 g 200 mL/hr over 30 Minutes Intravenous Every 12 hours 06/30/22 1801 07/02/22 1018   06/30/22 2100  vancomycin (VANCOREADY) IVPB 750 mg/150 mL  Status:  Discontinued        750 mg 150 mL/hr over 60 Minutes Intravenous Every 12 hours 06/30/22 0718 06/30/22 1801   06/30/22 0800  vancomycin (VANCOREADY) IVPB 2000 mg/400 mL        2,000 mg 200 mL/hr over 120 Minutes Intravenous NOW 06/30/22 0705 06/30/22 1222   06/30/22 0800  ceFEPIme (MAXIPIME) 2 g in sodium chloride 0.9 % 100 mL IVPB  Status:  Discontinued        2 g 200 mL/hr over 30 Minutes Intravenous Every 8 hours 06/30/22 0708 06/30/22 1801   06/29/22 1800  cefTRIAXone (ROCEPHIN) 2 g in sodium chloride 0.9 % 100 mL IVPB        2 g 200 mL/hr over 30 Minutes Intravenous  Once 06/29/22 1747 06/29/22 1910       Assessment/Plan: POD 4 s/p I&D of perirectal abscess, POD 2 debridement NSTI -WBC has been decreasing -cont abx therapy -cx with Group B strep, ID has seen him -surprised with  what appeared to be simple abscess initially with findings yesterday but this is NSTI, need to return to OR today as discussed with wife.   -appreciate ccm assistance and agree with diuresis as tolerated   FEN -  NPO, tube feeds fine, if he starts having a lot of loose stools flexiseal is fine due to wound  VTE - Lovenox ID - maxipime, vanc   Type I diabetes  [name] 07/04/2022

## 2022-07-04 NOTE — Anesthesia Postprocedure Evaluation (Signed)
Anesthesia Post Note  Patient: [name]  Procedure(s) Performed: IRRIGATION AND DEBRIDEMENT WOUND, DRESSING CHANGE     Patient location during evaluation: PACU Anesthesia Type: General Level of consciousness: awake and alert Pain management: pain level controlled Vital Signs Assessment: post-procedure vital signs reviewed and stable Respiratory status: spontaneous breathing, nonlabored ventilation, respiratory function stable and patient connected to nasal cannula oxygen Cardiovascular status: blood pressure returned to baseline and stable Postop Assessment: no apparent nausea or vomiting Anesthetic complications: no   No notable events documented.  Last Vitals:  Vitals:   07/04/22 0900 07/04/22 1050  BP: 138/67   Pulse: 86   Resp: (!) 31   Temp:    SpO2: 92% 98%    Last Pain:  Vitals:   07/04/22 0800  TempSrc:   PainSc: 3                  Thera Basden

## 2022-07-04 NOTE — Consult Note (Signed)
Cardiology Consultation   Patient ID: CHUKWUKA DWORAK MRN: [id number]; DOB: 1991/01/02  Admit date: 06/29/2022 Date of Consult: 07/04/2022  PCP:  Roe Rutherford, NP   [facility] Providers Cardiologist: New (Dr. Flora Lipps)  Patient Profile:   KASE MUETH is a 32 y.o. male with a history of insulin dependent diabetes and obesity who was admitted on 06/29/2022 for sepsis secondary to a perirectal abscess and AKI and was ultimately found to have Fournier's gangrene. Cardiology consulted on 07/04/2022 for pre-op evaluation and ST elevation on EKG at the request of Dr. Thora Lance.   History of Present Illness:   Mr. Mitrani is a 32 year old male with a history of insulin dependent diabetes and obesity. No known cardiac history. He presented to the ED on 06/29/2022 from Urgent Care for further evaluation of perirectal abscess and cellulitis. He states he first noticed a bump on his buttock on 06/25/2022 and then he started to feel poorly on 06/27/2022. He ultimately developed fevers and chills as well as palpitations and lightheadedness/ dizziness. He went to an Urgent Care on 06/29/2022 who then sent him to the ED. He was admitted for sepsis secondary to a peri-rectal abscess. He was started on antibiotics and then went to the OR for I&D on 06/30/2022. He subsequently developed severe scrotal swelling and went back to the OR on 07/02/2022 for excisional debridement. 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 He was noted to have ST elevations on telemetry on 07/03/2022. EKG was done which showed some mild lateral ST elevations. Repeat EKG about 20 minutes later showed almost complete resolution. Troponin was essentially negative at 21. Cardiology was consulted for further evaluation.  At the time of this evaluation, patient is resting comfortably in no acute distress on nasal cannula. He denies any cardiac symptoms. No chest pain, shortness of breath, orthopnea, PND, edema. He had some  palpitations and dizziness on 06/27/2022 when he was becoming septic but no symptoms prior to this. No syncope. No other recent fevers or illnesses prior to development of abscess.   He denies any family history of heart disease. He denies any tobacco, alcohol, or drug use.  Dr. Flora Lipps performed bedside ultrasound and EF is normal with no wall motion abnormalities.   Past Medical History:  Diagnosis Date   Diabetes mellitus    insulin pump    Past Surgical History:  Procedure Laterality Date   INCISION AND DRAINAGE ABSCESS     5-6 yrs ago, buttock   INCISION AND DRAINAGE OF WOUND N/A 07/02/2022   Procedure: IRRIGATION AND DEBRIDEMENT PERINEAL WOUND;  Surgeon: Emelia Loron, MD;  Location: [facility];  Service: General;  Laterality: N/A;   INCISION AND DRAINAGE PERIRECTAL ABSCESS N/A 06/30/2022   Procedure: IRRIGATION AND DEBRIDEMENT PERIRECTAL ABSCESS;  Surgeon: Emelia Loron, MD;  Location: [facility];  Service: General;  Laterality: N/A;   KNEE SURGERY         Inpatient Medications: Scheduled Meds:  acetaminophen (TYLENOL) oral liquid 160 mg/5 mL  1,000 mg Per Tube Q6H   Chlorhexidine Gluconate Cloth  6 each Topical Daily   docusate  100 mg Per Tube BID   enoxaparin (LOVENOX) injection  40 mg Subcutaneous Q24H   famotidine  20 mg Per Tube BID   feeding supplement (PROSource TF20)  60 mL Per Tube BID   fentaNYL (SUBLIMAZE) injection  50 mcg Intravenous Once   insulin aspart  0-20 Units Subcutaneous Q4H   insulin detemir  20 Units Subcutaneous  BID   sodium chloride flush  10-40 mL Intracatheter Q12H   sodium chloride flush  3 mL Intravenous Q12H   Continuous Infusions:  sodium chloride Stopped (07/03/22 0152)   sodium chloride 10 mL/hr at 07/04/22 0300   cefTRIAXone (ROCEPHIN)  IV Stopped (07/03/22 1449)   dexmedetomidine (PRECEDEX) IV infusion 0.4 mcg/kg/hr (07/04/22 1610)   feeding supplement (VITAL 1.5 CAL) Stopped (07/03/22 2330)   lactated ringers     metronidazole  Stopped (07/04/22 0428)   norepinephrine (LEVOPHED) Adult infusion 3 mcg/min (07/04/22 0300)   PRN Meds: diphenhydrAMINE, EPINEPHrine, HYDROmorphone (DILAUDID) injection **OR** HYDROmorphone (DILAUDID) injection, lactated ringers, ondansetron **OR** ondansetron (ZOFRAN) IV, prochlorperazine, sodium chloride flush  Allergies:    Allergies  Allergen Reactions   Amoxicillin Rash   Levaquin [Levofloxacin] Rash    Social History:   Social History   Socioeconomic History   Marital status: Single    Spouse name: Not on file   Number of children: Not on file   Years of education: Not on file   Highest education level: Not on file  Occupational History   Not on file  Tobacco Use   Smoking status: Never   Smokeless tobacco: Never  Substance and Sexual Activity   Alcohol use: Yes    Comment: occasionally   Drug use: No   Sexual activity: Yes    Birth control/protection: Condom  Other Topics Concern   Not on file  Social History Narrative   Not on file   Social Determinants of Health   Financial Resource Strain: Not on file  Food Insecurity: No Food Insecurity (06/30/2022)   Hunger Vital Sign    Worried About Running Out of Food in the Last Year: Never true    Ran Out of Food in the Last Year: Never true  Transportation Needs: No Transportation Needs (06/30/2022)   PRAPARE - Administrator, Civil Service (Medical): No    Lack of Transportation (Non-Medical): No  Physical Activity: Not on file  Stress: Not on file  Social Connections: Not on file  Intimate Partner Violence: Not At Risk (06/30/2022)   Humiliation, Afraid, Rape, and Kick questionnaire    Fear of Current or Ex-Partner: No    Emotionally Abused: No    Physically Abused: No    Sexually Abused: No    Family History:   Family History  Problem Relation Age of Onset   Diabetes Other      ROS:  Please see the history of present illness.  All other ROS reviewed and negative.     Physical Exam/Data:    Vitals:   07/04/22 0600 07/04/22 0615 07/04/22 0630 07/04/22 0645  BP: 114/61 (!) 85/61 (!) 105/44 128/66  Pulse: 93 99 98 83  Resp: (!) 28 (!) 21 (!) 21 (!) 30  Temp:      TempSrc:      SpO2: 94% 91% 93% 92%  Weight:      Height:        Intake/Output Summary (Last 24 hours) at 07/04/2022 0831 Last data filed at 07/04/2022 0500 Gross per 24 hour  Intake 2560.22 ml  Output 3525 ml  Net -964.78 ml      06/30/2022   12:32 PM 06/29/2022    3:32 PM 03/23/2015    2:00 PM  Last 3 Weights  Weight (lbs) 222 lb 222 lb 204 lb 6.4 oz  Weight (kg) 100.699 kg 100.699 kg 92.715 kg     Body mass index is 34.77 kg/m.  General: 32 y.o. obese Caucasian male resting comfortably in no acute distress. HEENT: Normocephalic and atraumatic. Sclera clear. EOMs intact. Neck: Supple. No JVD. Heart: RRR. Distinct S1 and S2. No murmurs, gallops, or rubs.  Lungs: No increased work of breathing. Clear to ausculation anteriorly. No wheezes, rhonchi, or rales.  Abdomen: Soft, non-distended, and non-tender to palpation.  Extremities: Trace lower extremity edema bilaterally.    Skin: Warm and dry. Neuro: Alert and oriented x3. No focal deficits. Psych: Normal affect. Responds appropriately.  EKG:  The EKG was personally reviewed and demonstrates:  EKG on 07/03/2022 showed normal sinus rhythm with mild ST elevation in lateral leads. Repeat EKG about 20 minutes later showed almost complete resolution of this. Telemetry:  Telemetry was personally reviewed and demonstrates:  Normal sinus rhythm with rates in the 70s.  Relevant CV Studies: Echo pending.  Laboratory Data:  High Sensitivity Troponin:   Recent Labs  Lab 07/03/22 1923  TROPONINIHS 21*     Chemistry Recent Labs  Lab 07/03/22 0327 07/03/22 1559 07/04/22 0559  NA 138 137 140  K 3.4* 4.7 3.5  CL 108 106 108  CO2 21* 22 22  GLUCOSE 176* 347* 286*  BUN 17 17 18   CREATININE 1.22 1.29* 1.18  CALCIUM 7.3* 7.4* 7.5*  MG 1.7 1.6* 2.1   GFRNONAA >60 >60 >60  ANIONGAP 9 9 10     Recent Labs  Lab 06/30/22 0842 07/01/22 0429 07/03/22 0327  PROT 6.0* 5.8* 5.4*  ALBUMIN 2.6* 2.3* 2.1*  AST 27 23 22   ALT 26 22 23   ALKPHOS 102 109 104  BILITOT 1.3* 0.5 0.5   Lipids  Recent Labs  Lab 07/03/22 0327  TRIG 295*    Hematology Recent Labs  Lab 07/02/22 0816 07/03/22 0327 07/04/22 0401  WBC 16.7* 13.1* 13.4*  RBC 3.68* 3.36* 3.12*  HGB 10.5* 9.6* 8.9*  HCT 32.3* 29.7* 28.0*  MCV 87.8 88.4 89.7  MCH 28.5 28.6 28.5  MCHC 32.5 32.3 31.8  RDW 14.4 14.6 15.3  PLT 256 223 281   Thyroid No results for input(s): "TSH", "FREET4" in the last 168 hours.  BNP Recent Labs  Lab 07/02/22 0816  BNP 342.1*    DDimer No results for input(s): "DDIMER" in the last 168 hours.   Radiology/Studies:  [place] EKG SITE RITE  Result Date: 07/03/2022 If Site Rite image not attached, placement could not be confirmed due to current cardiac rhythm.  DG Chest Port 1 View  Result Date: 07/03/2022 CLINICAL DATA:  Hypoxia EXAM: PORTABLE CHEST 1 VIEW COMPARISON:  07/02/2022 FINDINGS: Endotracheal tube and gastric catheter are again seen and stable. Cardiac shadow is unchanged. Persistent changes of edema are noted bilaterally. No bony abnormality is seen. IMPRESSION: Diffuse pulmonary edema. Electronically Signed   By: Alcide Clever M.D.   On: 07/03/2022 03:06   DG Abd 1 View  Result Date: 07/02/2022 CLINICAL DATA:  Placement of NG tube EXAM: ABDOMEN - 1 VIEW COMPARISON:  01/17/2010 FINDINGS: Tip of NG tube is seen in the region of the antrum of the stomach. Bowel gas pattern is nonspecific. IMPRESSION: Tip of NG tube is seen in the antrum of the stomach. Electronically Signed   By: Ernie Avena M.D.   On: 07/02/2022 15:30   DG CHEST PORT 1 VIEW  Result Date: 07/02/2022 CLINICAL DATA:  Difficulty breathing, endotracheal tube placement EXAM: PORTABLE CHEST 1 VIEW COMPARISON:  Previous studies including the examination done earlier today  FINDINGS: Transverse diameter of heart is increased. Diffuse  alveolar densities are seen in both lungs which was not seen on the previous examination. Lateral costophrenic angles are indistinct. There is no pneumothorax. Tip of endotracheal tube is 7.5 cm above the carina. NG tube is noted traversing the esophagus with its distal portion in the stomach. IMPRESSION: There is interval appearance of alveolar densities in both lungs suggesting pulmonary edema. Part of this finding may be due to atelectasis. Tip of endotracheal tube is in the lower neck 7.5 cm above the carina. Electronically Signed   By: Ernie Avena M.D.   On: 07/02/2022 15:30   DG CHEST PORT 1 VIEW  Result Date: 07/02/2022 CLINICAL DATA:  Hypoxia. EXAM: PORTABLE CHEST 1 VIEW COMPARISON:  October 05, 2014. FINDINGS: The heart size and mediastinal contours are within normal limits. Both lungs are clear. The visualized skeletal structures are unremarkable. IMPRESSION: No active disease. Electronically Signed   By: Lupita Raider M.D.   On: 07/02/2022 13:50     Assessment and Plan:   Pre-Op Evaluation Abnormal EKG Patient admitted with sepsis secondary to perirectal abscess and fournier's gangrene. He has already gone to the OR twice, once for I&D and once for excisional debridement. Scheduled to go back to the OR today. He was noted to have ST elevation on telemetry yesterday. EKG showed mild ST elevation in lateral leads that almost completely resolved on repeat EKG 20 minutes later. High-sensitivity troponin essentially negative at 21. He denies any chest pain or shortness of breath. Formal Echo is still pending but Dr. Flora Lipps performed bedside Echo which showed normal LV function and no wall motion abnormalities. Unclear what caused very transient EKG changes. However, patient is stable from a cardiac standpoint to go back to the OR. We would still like for him to have the formal Echo but this does not need to be done prior to  surgery.   Otherwise, per primary team.   Risk Assessment/Risk Scores:    For questions or updates, please contact [facility] Please consult [email or web address] for contact info under    Signed, Corrin Parker, PA-C  07/04/2022 8:31 AM

## 2022-07-04 NOTE — Op Note (Signed)
Excisional debridement:   1.  Progress note or procedure note with a detailed description of the procedure. Preoperative diagnosis: perirectal abscess Postoperative diagnosis: NSTI perineum Procedure:dressing change, minimal superficial debridement Dr Harden Mo EBL minimal Specimens none Drains none Dispo icu    Indications: 32 yo male DM with perirectal drained that I thought was simple abscess. Improving overall but perineum and scrotum red today.  Two days ago took to OR and did excision of NSTI. Returning to OR today   Procedure: Informed consent obtained and taken to OR. Placed under general anesthesia.  He dropped his oxygen saturation with induction again.  He was then placed in lithotomy and appropriately padded.  He was prepped and draped in sterile sterile surgical fashion.  Surgical timeout was performed.   The wound was pretty clean. I pulse lavaged it due to a bowel movement and the betadine. There was minimal superficial questionable tissue that I excised.  All tissue otherwise healthy. I placed a new dressing and he tolerated this well.

## 2022-07-04 NOTE — Progress Notes (Signed)
[facility]  Date of Admission:  06/29/2022     Lines:  6/6-c rue picc   Abx: 6/5-c ceftriaxone   6/2-5 cefepime 6/2-5 vanc                                                            Assessment: 32 yo male obese, dm1, admitted 6/2 with a left perirectal abscess meeting severe sepsis criteria with aki   He is s/p I&D on 6/3. He has subjective f/c at home but bcx so far is negative He never had pilonidal cyst or perirectal abscess before     06/29/22 [place] swab cx of abscess normal flora 06/29/22 bcx ngtd   06/30/22 operative cx  group b strep 06/30/22 bcx ngtd 06/30/22 hiv screen negative   07/02/22 today surgery team reevaluated and ultimately patient had developed fournier's gangrene. S/p repeat I&D 6/5 with urology involvement   ------------- 07/04/22 aki resolved Leukocytosis improving  a1c 12's and will need better outpatient diabetic control; metabolic syndrome/poorly controlled dm likely culprit of this severe infection  6/3 initial operative cx reviewed Group b strep, peptostreptococcus, gnr/gpr/gpc on stain. No mrsa or pseudomonas found yet.  We are deferring amoxicillin challenge dose given his severe illness at this time   Afebrile since 6/5 debridement 6/7 repeat OR evaluation --> wound "pretty clean; minimal superficial tissue excised otherwise all tissue healthy  Remains in icu postop    Plan: -continue ceftriaxone/flagyl -appreciate surgical and pulm/ccm care for patient -discussed with primary team       Principal Problem:   Fournier's gangrene in male Active Problems:   Sepsis (HCC)   Perirectal abscess   AKI (acute kidney injury) (HCC)   Type 2 diabetes mellitus with hyperglycemia, with long-term current use of insulin (HCC)   Scrotal edema   Acute respiratory failure with hypoxia (HCC)   Allergies  Allergen Reactions   Amoxicillin Rash   Levaquin [Levofloxacin] Rash    Scheduled Meds:   acetaminophen (TYLENOL) oral liquid 160 mg/5 mL  1,000 mg Per Tube Q6H   Chlorhexidine Gluconate Cloth  6 each Topical Daily   docusate  100 mg Per Tube BID   enoxaparin (LOVENOX) injection  40 mg Subcutaneous Q24H   famotidine  20 mg Per Tube BID   feeding supplement (PROSource TF20)  60 mL Per Tube BID   fentaNYL (SUBLIMAZE) injection  50 mcg Intravenous Once   insulin aspart  0-20 Units Subcutaneous Q4H   insulin detemir  20 Units Subcutaneous BID   polyethylene glycol  17 g Per Tube Daily   sodium chloride flush  10-40 mL Intracatheter Q12H   sodium chloride flush  3 mL Intravenous Q12H   Continuous Infusions:  sodium chloride Stopped (07/03/22 0152)   sodium chloride 150 mL/hr at 07/04/22 1106   cefTRIAXone (ROCEPHIN)  IV Stopped (07/03/22 1449)   dexmedetomidine (PRECEDEX) IV infusion Stopped (07/04/22 0919)   feeding supplement (VITAL 1.5 CAL) Stopped (07/03/22 2330)   fentaNYL infusion INTRAVENOUS 50 mcg/hr (07/04/22 1106)   lactated ringers     metronidazole Stopped (07/04/22 0428)   norepinephrine (LEVOPHED) Adult infusion 3 mcg/min (07/04/22 1106)   propofol (DIPRIVAN) infusion 40 mcg/kg/min (07/04/22 1106)   PRN Meds:.diphenhydrAMINE, EPINEPHrine, fentaNYL, HYDROmorphone (DILAUDID) injection **  OR** HYDROmorphone (DILAUDID) injection, lactated ringers, ondansetron **OR** ondansetron (ZOFRAN) IV, prochlorperazine, sodium chloride flush   SUBJECTIVE: Afebrile Remains in icu Repeat I&D today showed mostly healthy appearing tissue  Wbc down to 13 Aki had resolved   Self extubated last night and reintubated for surgery this morning  Remains on pressor but very low dose 3 mcg/min   Review of Systems: ROS All other ROS was negative, except mentioned above     OBJECTIVE: Vitals:   07/04/22 0645 07/04/22 0800 07/04/22 0900 07/04/22 1050  BP: 128/66 (!) 129/57 138/67   Pulse: 83 83 86   Resp: (!) 30 (!) 31 (!) 31   Temp:      TempSrc:      SpO2: 92% 92% 92%  98%  Weight:      Height:       Body mass index is 34.77 kg/m.  Physical Exam  General/constitutional: intubated, sedated, on pressor levophed down to 3 mcg/min HEENT: Normocephalic; ett in place CV: rrr no mrg Lungs: clear on vent; vent setting 60% fio2, 8 peep Abd: Soft, Nontender Ext: no edema Skin/gu; anterior groin/gu examined -- dressing in place, clean/dry; foley in place  Neuro: sedated MSK: no peripheral joint swelling/warmth  Central line presence: rue picc clean/no purulence  Lab Results Lab Results  Component Value Date   WBC 13.4 (H) 07/04/2022   HGB 8.9 (L) 07/04/2022   HCT 28.0 (L) 07/04/2022   MCV 89.7 07/04/2022   PLT 281 07/04/2022    Lab Results  Component Value Date   CREATININE 1.18 07/04/2022   BUN 18 07/04/2022   NA 140 07/04/2022   K 3.5 07/04/2022   CL 108 07/04/2022   CO2 22 07/04/2022    Lab Results  Component Value Date   ALT 23 07/03/2022   AST 22 07/03/2022   ALKPHOS 104 07/03/2022   BILITOT 0.5 07/03/2022      Microbiology: Recent Results (from the past 240 hour(s))  Blood Culture (routine x 2)     Status: None   Collection Time: 06/29/22  3:46 PM   Specimen: BLOOD RIGHT HAND  Result Value Ref Range Status   Specimen Description   Final    BLOOD RIGHT HAND BOTTLES DRAWN AEROBIC AND ANAEROBIC   Special Requests Blood Culture adequate volume  Final   Culture   Final    NO GROWTH 5 DAYS Performed at [facility], [address], [place], [place] [address]    Report Status 07/04/2022 FINAL  Final  Blood Culture (routine x 2)     Status: None   Collection Time: 06/29/22  3:55 PM   Specimen: Right Antecubital; Blood  Result Value Ref Range Status   Specimen Description   Final    RIGHT ANTECUBITAL BOTTLES DRAWN AEROBIC AND ANAEROBIC   Special Requests   Final    Blood Culture results may not be optimal due to an excessive volume of blood received in culture bottles   Culture   Final    NO GROWTH 5 DAYS Performed at [facility], [address] [place], [place] [address]    Report Status 07/04/2022 FINAL  Final  Aerobic Culture w Gram Stain (superficial specimen)     Status: None   Collection Time: 06/29/22  5:00 PM   Specimen: Abscess  Result Value Ref Range Status   Specimen Description   Final    ABSCESS Performed at [facility], [address] [place], [place] [address]    Special Requests  Final    NONE Performed at [facility], [address] [place], [place] [address]    Gram Stain   Final    RARE WBC PRESENT, PREDOMINANTLY PMN NO ORGANISMS SEEN    Culture   Final    RARE NORMAL SKIN FLORA Performed at [facility], [address], [place], [place] [address]    Report Status 07/02/2022 FINAL  Final  MRSA Next Gen by PCR, Nasal     Status: None   Collection Time: 06/30/22  8:37 AM   Specimen: Nasal Mucosa; Nasal Swab  Result Value Ref Range Status   MRSA by PCR Next Gen NOT DETECTED NOT DETECTED Final    Comment: (NOTE) The GeneXpert MRSA Assay (FDA approved for NASAL specimens only), is one component of a comprehensive MRSA colonization surveillance program. It is not intended to diagnose MRSA infection nor to guide or monitor treatment for MRSA infections. Test performance is not FDA approved in patients less than 29 years old. Performed at [facility], [address] [place], [place] [address]   Culture, blood (Routine X 2) w Reflex to ID Panel     Status: None (Preliminary result)   Collection Time: 06/30/22  9:08 AM   Specimen: BLOOD  Result Value Ref Range Status   Specimen Description   Final    BLOOD BLOOD LEFT HAND Performed at [facility], [address] [place], [place] [address]    Special Requests   Final    BOTTLES DRAWN AEROBIC ONLY Blood Culture adequate volume Performed at [facility], [address], [place], [place] [address]    Culture   Final    NO GROWTH 3 DAYS Performed at [facility], [address], [place], [place] [address]    Report Status PENDING  Incomplete  Culture, blood (Routine X 2) w Reflex to ID Panel     Status: None (Preliminary result)   Collection Time: 06/30/22 10:05 AM   Specimen: BLOOD RIGHT HAND  Result Value Ref Range Status   Specimen Description   Final    BLOOD RIGHT HAND Performed at [facility], [address], [place], [place] [address]    Special Requests   Final    AEROBIC BOTTLE ONLY Blood Culture adequate volume Performed at [facility], [address] [place], [place] [address]    Culture   Final    NO GROWTH 3 DAYS Performed at [facility], [address] [place], [place] [address]    Report Status PENDING  Incomplete  Aerobic/Anaerobic Culture w Gram Stain (surgical/deep wound)     Status: None (Preliminary result)   Collection Time: 06/30/22  1:39 PM   Specimen: Path fluid; Abscess  Result Value Ref Range Status   Specimen Description   Final    ABSCESS PERIRECTAL Performed at [facility], [address], [place], [place] [address]    Special Requests   Final    NONE Performed at [facility], [address], [place], [place] [address]    Gram Stain   Final    NO WBC SEEN MODERATE GRAM POSITIVE COCCI MODERATE GRAM NEGATIVE RODS RARE GRAM POSITIVE RODS    Culture   Final    RARE GROUP B STREP(S.AGALACTIAE)ISOLATED TESTING AGAINST S. AGALACTIAE NOT ROUTINELY PERFORMED DUE TO PREDICTABILITY OF AMP/PEN/VAN SUSCEPTIBILITY. FEW PEPTOSTREPTOCOCCUS ANAEROBIUS Standardized susceptibility testing for this organism is not available. CULTURE  REINCUBATED FOR BETTER GROWTH Performed at [facility], [address] [place], [place] [address]    Report Status PENDING  Incomplete  Culture, Respiratory w Gram Stain     Status: None (Preliminary result)   Collection Time: 07/03/22 10:36 AM   Specimen: Tracheal Aspirate;  Respiratory  Result Value Ref Range Status   Specimen Description   Final    TRACHEAL ASPIRATE Performed at [facility], [address] [place], [place] [address]    Special Requests   Final    NONE Performed at [facility], [address] [place], [place] [address]    Gram Stain   Final    ABUNDANT WBC PRESENT,BOTH PMN AND MONONUCLEAR NO ORGANISMS SEEN Performed at [facility], [address], [place], [place] [address]    Culture PENDING  Incomplete   Report Status PENDING  Incomplete     Serology:   Imaging: If present, new imagings (plain films, ct scans, and mri) have been personally visualized and interpreted; radiology reports have been reviewed. Decision making incorporated into the Impression / Recommendations.   06/29/22 abd pelv ct with contrast IMPRESSION: 1. No acute abdominal findings. 2. Patchy atelectasis or infiltrate posteriorly at the left lung base.  6/6 cxr Diffuse pulmonary edema.   Raymondo Band, MD [facility] [phone] pager    07/04/2022, 11:09 AM

## 2022-07-04 NOTE — Anesthesia Preprocedure Evaluation (Addendum)
Anesthesia Evaluation  Patient identified by MRN, date of birth, ID band Patient awake    Reviewed: Allergy & Precautions, NPO status , Patient's Chart, lab work & pertinent test results  Airway Mallampati: II  TM Distance: >3 FB Neck ROM: Full    Dental no notable dental hx. (+) Teeth Intact, Dental Advisory Given   Pulmonary neg pulmonary ROS   Pulmonary exam normal breath sounds clear to auscultation       Cardiovascular negative cardio ROS Normal cardiovascular exam Rhythm:Regular Rate:Normal     Neuro/Psych negative neurological ROS  negative psych ROS   GI/Hepatic negative GI ROS, Neg liver ROS,,,  Endo/Other  diabetes, Poorly Controlled, Type 1, Insulin Dependent    Renal/GU ARF and Renal InsufficiencyRenal disease     Musculoskeletal Perineal infection   Abdominal   Peds  Hematology  (+) Blood dyscrasia, anemia   Anesthesia Other Findings All: Amoxiciallin, Levaquin  Reproductive/Obstetrics                             Anesthesia Physical Anesthesia Plan  ASA: 3  Anesthesia Plan: General   Post-op Pain Management: Ofirmev IV (intra-op)* and Dilaudid IV   Induction: Intravenous  PONV Risk Score and Plan: 3 and Treatment may vary due to age or medical condition, Midazolam, Dexamethasone and Metaclopromide  Airway Management Planned: Oral ETT and LMA  Additional Equipment: None  Intra-op Plan:   Post-operative Plan: Post-operative intubation/ventilation  Informed Consent: I have reviewed the patients History and Physical, chart, labs and discussed the procedure including the risks, benefits and alternatives for the proposed anesthesia with the patient or authorized representative who has indicated his/her understanding and acceptance.     Dental advisory given  Plan Discussed with: CRNA and Anesthesiologist  Anesthesia Plan Comments: ([name] is a 32 year old male  with a history of insulin dependent diabetes and obesity. No known cardiac history. He presented to the ED on 06/29/2022 from Urgent Care for further evaluation of perirectal abscess and cellulitis. He states he first noticed a bump on his buttock on 06/25/2022 and then he started to feel poorly on 06/27/2022. He ultimately developed fevers and chills as well as palpitations and lightheadedness/ dizziness. He went to an Urgent Care on 06/29/2022 who then sent him to the ED. He was admitted for sepsis secondary to a peri-rectal abscess. He was started on antibiotics and then went to the OR for I&D on 06/30/2022. He subsequently developed severe scrotal swelling and went back to the OR on 07/02/2022 for excisional debridement. 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 He was noted to have ST elevations on telemetry on 07/03/2022. EKG was done which showed some mild lateral ST elevations. Repeat EKG about 20 minutes later showed almost complete resolution. Troponin was essentially negative at 21. Cardiology was consulted for further evaluation.   At the time of this evaluation, patient is resting comfortably in no acute distress on nasal cannula. He denies any cardiac symptoms. No chest pain, shortness of breath, orthopnea, PND, edema. He had some palpitations and dizziness on 06/27/2022 when he was becoming septic but no symptoms prior to this. No syncope. No other recent fevers or illnesses prior to development of abscess.    He denies any family history of heart disease. He denies any tobacco, alcohol, or drug use.   Dr. Flora Lipps performed bedside ultrasound and EF is normal with no wall motion abnormalities.  )  Anesthesia Quick Evaluation

## 2022-07-04 NOTE — Inpatient Diabetes Management (Signed)
Inpatient Diabetes Program Recommendations  AACE/ADA: New Consensus Statement on Inpatient Glycemic Control (2015)  Target Ranges:  Prepandial:   less than 140 mg/dL      Peak postprandial:   less than 180 mg/dL (1-2 hours)      Critically ill patients:  140 - 180 mg/dL   Lab Results  Component Value Date   GLUCAP 258 (H) 07/04/2022   HGBA1C 12.3 (H) 06/30/2022    Review of Glycemic Control  Diabetes history: DM1 Outpatient Diabetes medications: Toujeo 60 QHS, Humalog 10-20 units QID Current orders for Inpatient glycemic control: Levemir 20 BID, Novolog 0-20 Q4H  HgbA1C - 12.3% On TF - Vital 1.5@60 /H Did not get Levemir 20 units at HS on 6/6  Inpatient Diabetes Program Recommendations:    Consider decreasing Novolog to 0-15 Q4H (pt is DM1 and sensitive to insulin)  Add TF coverage - Novolog 4 units Q4H (do not give if TF is held for any reason)  Insulin needs higher with infection.  Continue to monitor, follow glucose trends.  Thank you. Ailene Ards, RD, LDN, CDCES Inpatient Diabetes Coordinator [phone]

## 2022-07-04 NOTE — Procedures (Signed)
Extubation Procedure Note  Patient Details:   Name: [name] DOB: 01/25/1991 MRN: [id number]   Airway Documentation:    Vent end date: 07/04/22 Vent end time: 1350   Evaluation  O2 sats: stable throughout Complications: No apparent complications Patient did tolerate procedure well. Bilateral Breath Sounds: Clear, Diminished   Yes  Pt extubated to [facility] per CCMD order. Pt saturations are 100% at this time. Pt was suctioned and had a positive cuff leak prior to extubation. Pt was able to speak afterwards and no stridor is heard at this time. CCMD was at bedside during extubation.   Adelynn Gipe A Semaj Kham 07/04/2022, 1:55 PM

## 2022-07-05 ENCOUNTER — Encounter (HOSPITAL_COMMUNITY): Payer: Self-pay | Admitting: General Surgery

## 2022-07-05 DIAGNOSIS — G9341 Metabolic encephalopathy: Secondary | ICD-10-CM | POA: Diagnosis not present

## 2022-07-05 DIAGNOSIS — N493 Fournier gangrene: Secondary | ICD-10-CM | POA: Diagnosis not present

## 2022-07-05 LAB — BASIC METABOLIC PANEL
Anion gap: 9 (ref 5–15)
BUN: 13 mg/dL (ref 6–20)
CO2: 24 mmol/L (ref 22–32)
Calcium: 7.2 mg/dL — ABNORMAL LOW (ref 8.9–10.3)
Chloride: 107 mmol/L (ref 98–111)
Creatinine, Ser: 0.94 mg/dL (ref 0.61–1.24)
GFR, Estimated: >60 mL/min (ref >60)
Glucose, Bld: 118 mg/dL — ABNORMAL HIGH (ref 70–99)
Potassium: 3.4 mmol/L — ABNORMAL LOW (ref 3.5–5.1)
Sodium: 140 mmol/L (ref 135–145)

## 2022-07-05 LAB — BLOOD GAS, ARTERIAL
Bicarbonate: 23.3 mmol/L (ref 20.0–28.0)
MECHVT: 520 mL
O2 Saturation: 99.8 %
PEEP: 10 cmH2O
Patient temperature: 36.7
pH, Arterial: 7.47 — ABNORMAL HIGH (ref 7.35–7.45)
pO2, Arterial: 129 mmHg — ABNORMAL HIGH (ref 83–108)

## 2022-07-05 LAB — MAGNESIUM: Magnesium: 1.9 mg/dL (ref 1.7–2.4)

## 2022-07-05 LAB — GLUCOSE, CAPILLARY
Glucose-Capillary: 99 mg/dL (ref 70–99)
Glucose-Capillary: 135 mg/dL — ABNORMAL HIGH (ref 70–99)
Glucose-Capillary: 171 mg/dL — ABNORMAL HIGH (ref 70–99)
Glucose-Capillary: 202 mg/dL — ABNORMAL HIGH (ref 70–99)
Glucose-Capillary: 172 mg/dL — ABNORMAL HIGH (ref 70–99)

## 2022-07-05 LAB — CULTURE, BLOOD (ROUTINE X 2)
Culture: NO GROWTH
Special Requests: ADEQUATE
Special Requests: ADEQUATE

## 2022-07-05 LAB — CBC
HCT: 29.3 % — ABNORMAL LOW (ref 39.0–52.0)
Hemoglobin: 9.2 g/dL — ABNORMAL LOW (ref 13.0–17.0)
MCH: 28 pg (ref 26.0–34.0)
MCHC: 31.4 g/dL (ref 30.0–36.0)
MCV: 89.1 fL (ref 80.0–100.0)
Platelets: 344 10*3/uL (ref 150–400)
RBC: 3.29 MIL/uL — ABNORMAL LOW (ref 4.22–5.81)
RDW: 14.9 % (ref 11.5–15.5)
WBC: 12.6 10*3/uL — ABNORMAL HIGH (ref 4.0–10.5)
nRBC: 0 % (ref 0.0–0.2)

## 2022-07-05 LAB — PHOSPHORUS: Phosphorus: 3 mg/dL (ref 2.5–4.6)

## 2022-07-05 LAB — CULTURE, RESPIRATORY W GRAM STAIN

## 2022-07-05 MED ORDER — SODIUM CHLORIDE 0.9% FLUSH
9.0000 mL | INTRAVENOUS | Status: DC | PRN
Start: 2022-07-05 — End: 2022-07-05

## 2022-07-05 MED ORDER — DIPHENHYDRAMINE HCL 50 MG/ML IJ SOLN: 12.5000 mg | Freq: Four times a day (QID) | INTRAMUSCULAR | Status: DC | PRN

## 2022-07-05 MED ORDER — HYDROMORPHONE 1 MG/ML IV SOLN
INTRAVENOUS | Status: DC
  Administered 2022-07-05: 30 mg via INTRAVENOUS
  Administered 2022-07-05: 2.9 mg via INTRAVENOUS
  Administered 2022-07-06: 3 mg via INTRAVENOUS
  Filled 2022-07-05: qty 30

## 2022-07-05 MED ORDER — POTASSIUM CHLORIDE CRYS ER 20 MEQ PO TBCR
40.0000 meq | EXTENDED_RELEASE_TABLET | Freq: Once | ORAL | Status: AC
  Administered 2022-07-05: 40 meq via ORAL
  Filled 2022-07-05: qty 2

## 2022-07-05 MED ORDER — NALOXONE HCL 0.4 MG/ML IJ SOLN: 0.4000 mg | INTRAMUSCULAR | Status: DC | PRN

## 2022-07-05 MED ORDER — HYDROMORPHONE 1 MG/ML IV SOLN
INTRAVENOUS | Status: DC
Start: 2022-07-05 — End: 2022-07-05

## 2022-07-05 MED ORDER — NALOXONE HCL 0.4 MG/ML IJ SOLN
0.4000 mg | INTRAMUSCULAR | Status: DC | PRN
Start: 2022-07-05 — End: 2022-07-05

## 2022-07-05 MED ORDER — DIPHENHYDRAMINE HCL 12.5 MG/5ML PO ELIX
12.5000 mg | ORAL_SOLUTION | Freq: Four times a day (QID) | ORAL | Status: DC | PRN
Start: 2022-07-05 — End: 2022-07-05

## 2022-07-05 MED ORDER — SODIUM CHLORIDE 0.9% FLUSH: 9.0000 mL | INTRAVENOUS | Status: DC | PRN

## 2022-07-05 MED ORDER — ONDANSETRON HCL 4 MG/2ML IJ SOLN: 4.0000 mg | Freq: Four times a day (QID) | INTRAMUSCULAR | Status: DC | PRN

## 2022-07-05 MED ORDER — OXYCODONE HCL 5 MG PO TABS
5.0000 mg | ORAL_TABLET | ORAL | Status: DC | PRN
  Administered 2022-07-05 (×2): 5 mg via ORAL
  Filled 2022-07-05 (×2): qty 1

## 2022-07-05 MED ORDER — DIPHENHYDRAMINE HCL 12.5 MG/5ML PO ELIX: 12.5000 mg | ORAL_SOLUTION | Freq: Four times a day (QID) | ORAL | Status: DC | PRN

## 2022-07-05 MED ORDER — ESCITALOPRAM OXALATE 10 MG PO TABS
5.0000 mg | ORAL_TABLET | Freq: Every day | ORAL | Status: DC
  Administered 2022-07-05: 5 mg via ORAL
  Filled 2022-07-05: qty 1

## 2022-07-05 MED ORDER — CLONAZEPAM 0.125 MG PO TBDP
0.5000 mg | ORAL_TABLET | Freq: Every evening | ORAL | Status: DC | PRN
  Administered 2022-07-06 – 2022-07-08 (×2): 0.5 mg via ORAL
  Filled 2022-07-05 (×2): qty 4

## 2022-07-05 MED ORDER — MAGNESIUM SULFATE 2 GM/50ML IV SOLN
2.0000 g | Freq: Once | INTRAVENOUS | Status: AC
  Administered 2022-07-05: 2 g via INTRAVENOUS
  Filled 2022-07-05: qty 50

## 2022-07-05 MED ORDER — ORAL CARE MOUTH RINSE: 15.0000 mL | OROMUCOSAL | Status: DC | PRN

## 2022-07-05 NOTE — Plan of Care (Signed)
  Problem: Education: Goal: Knowledge of General Education information will improve Description: Including pain rating scale, medication(s)/side effects and non-pharmacologic comfort measures Outcome: Progressing   Problem: Health Behavior/Discharge Planning: Goal: Ability to manage health-related needs will improve Outcome: Progressing   Problem: Clinical Measurements: Goal: Ability to maintain clinical measurements within normal limits will improve Outcome: Progressing Goal: Will remain free from infection Outcome: Progressing Goal: Diagnostic test results will improve Outcome: Progressing Goal: Respiratory complications will improve Outcome: Progressing Goal: Cardiovascular complication will be avoided Outcome: Progressing   Problem: Activity: Goal: Risk for activity intolerance will decrease Outcome: Progressing   Problem: Nutrition: Goal: Adequate nutrition will be maintained Outcome: Progressing   Problem: Coping: Goal: Level of anxiety will decrease Outcome: Progressing   Problem: Elimination: Goal: Will not experience complications related to bowel motility Outcome: Progressing Goal: Will not experience complications related to urinary retention Outcome: Progressing   Problem: Pain Managment: Goal: General experience of comfort will improve Outcome: Progressing   Problem: Safety: Goal: Ability to remain free from injury will improve Outcome: Progressing   Problem: Skin Integrity: Goal: Risk for impaired skin integrity will decrease Outcome: Progressing   Problem: Education: Goal: Ability to describe self-care measures that may prevent or decrease complications (Diabetes Survival Skills Education) will improve Outcome: Progressing Goal: Individualized Educational Video(s) Outcome: Progressing   Problem: Coping: Goal: Ability to adjust to condition or change in health will improve Outcome: Progressing   Problem: Fluid Volume: Goal: Ability to  maintain a balanced intake and output will improve Outcome: Progressing   Problem: Health Behavior/Discharge Planning: Goal: Ability to identify and utilize available resources and services will improve Outcome: Progressing Goal: Ability to manage health-related needs will improve Outcome: Progressing   Problem: Metabolic: Goal: Ability to maintain appropriate glucose levels will improve Outcome: Progressing   Problem: Nutritional: Goal: Maintenance of adequate nutrition will improve Outcome: Progressing Goal: Progress toward achieving an optimal weight will improve Outcome: Progressing   Problem: Skin Integrity: Goal: Risk for impaired skin integrity will decrease Outcome: Progressing   Problem: Tissue Perfusion: Goal: Adequacy of tissue perfusion will improve Outcome: Progressing   Problem: Activity: Goal: Ability to tolerate increased activity will improve Outcome: Completed/Met   Problem: Respiratory: Goal: Ability to maintain a clear airway and adequate ventilation will improve Outcome: Completed/Met   Problem: Role Relationship: Goal: Method of communication will improve Outcome: Completed/Met   Problem: Safety: Goal: Non-violent Restraint(s) Outcome: Completed/Met  Cindy S. Clelia Croft BSN, RN, CCRP 07/05/2022 4:42 AM

## 2022-07-05 NOTE — Progress Notes (Signed)
1 Day Post-Op   Subjective/Chief Complaint: Awake alert doing well this am tol diet   Objective: Vital signs in last 24 hours: Temp:  [98.4 F (36.9 C)-99.8 F (37.7 C)] 98.4 F (36.9 C) (06/08 0800) Pulse Rate:  [64-109] 89 (06/08 0800) Resp:  [14-24] 20 (06/08 0800) BP: (84-154)/(34-81) 128/64 (06/08 0800) SpO2:  [91 %-100 %] 97 % (06/08 0800) FiO2 (%):  [28 %-66 %] 28 % (06/08 0300) Last BM Date : 07/04/22  Intake/Output from previous day: 06/07 0701 - 06/08 0700 In: 2654.7 [P.O.:890; I.V.:1189.2; IV Piggyback:545.5] Out: 5450 [Urine:5300; Stool:100; Blood:50] Intake/Output this shift: Total I/O In: 212.2 [I.V.:63.8; IV Piggyback:148.4] Out: 150 [Urine:150]  Dressing intact  Lab Results:  Recent Labs    07/04/22 0401 07/05/22 0424  WBC 13.4* 12.6*  HGB 8.9* 9.2*  HCT 28.0* 29.3*  PLT 281 344   BMET Recent Labs    07/04/22 0559 07/05/22 0424  NA 140 140  K 3.5 3.4*  CL 108 107  CO2 22 24  GLUCOSE 286* 118*  BUN 18 13  CREATININE 1.18 0.94  CALCIUM 7.5* 7.2*   PT/INR No results for input(s): "LABPROT", "INR" in the last 72 hours. ABG Recent Labs    07/02/22 1554 07/03/22 0341 07/04/22 1125  PHART 7.33* 7.47*  --   HCO3 22.6 23.3 23.7    Studies/Results: ECHOCARDIOGRAM COMPLETE  Result Date: 07/04/2022    ECHOCARDIOGRAM REPORT   Patient Name:   [name] Date of Exam: 07/04/2022 Medical Rec #:  [id number]     Height:       67.0 in Accession #:    [id number]    Weight:       222.0 lb Date of Birth:  [birth year]     BSA:          2.114 m Patient Age:    32 years      BP:           122/70 mmHg Patient Gender: M             HR:           98 bpm. Exam Location:  Inpatient Procedure: 2D Echo, Cardiac Doppler and Color Doppler Indications:    Dyspnea  History:        Patient has no prior history of Echocardiogram examinations.                 Signs/Symptoms:Fever and acute respiratory failure; Risk                 Factors:Diabetes and abscess, sepsis.   Sonographer:    Wallie Char Referring Phys: [id number] Clarene Critchley [facility]  Sonographer Comments: Echo performed with patient supine and on artificial respirator, patient is obese and Technically challenging study due to limited acoustic windows. Image acquisition challenging due to patient body habitus. IMPRESSIONS  1. Left ventricular ejection fraction, by estimation, is 60 to 65%. The left ventricle has normal function. The left ventricle has no regional wall motion abnormalities. Left ventricular diastolic parameters were normal.  2. Right ventricular systolic function is normal. The right ventricular size is mildly enlarged.  3. The mitral valve is normal in structure. No evidence of mitral valve regurgitation. No evidence of mitral stenosis.  4. The aortic valve is normal in structure. Aortic valve regurgitation is not visualized. No aortic stenosis is present.  5. The inferior vena cava is normal in size with greater than 50% respiratory variability, suggesting right atrial pressure of 3  mmHg. FINDINGS  Left Ventricle: Left ventricular ejection fraction, by estimation, is 60 to 65%. The left ventricle has normal function. The left ventricle has no regional wall motion abnormalities. The left ventricular internal cavity size was normal in size. There is  no left ventricular hypertrophy. Left ventricular diastolic parameters were normal. Right Ventricle: The right ventricular size is mildly enlarged. No increase in right ventricular wall thickness. Right ventricular systolic function is normal. Left Atrium: Left atrial size was normal in size. Right Atrium: Right atrial size was normal in size. Pericardium: There is no evidence of pericardial effusion. Mitral Valve: The mitral valve is normal in structure. No evidence of mitral valve regurgitation. No evidence of mitral valve stenosis. MV peak gradient, 5.5 mmHg. The mean mitral valve gradient is 3.0 mmHg. Tricuspid Valve: The tricuspid valve is normal in structure.  Tricuspid valve regurgitation is trivial. No evidence of tricuspid stenosis. Aortic Valve: The aortic valve is normal in structure. Aortic valve regurgitation is not visualized. No aortic stenosis is present. Aortic valve mean gradient measures 7.0 mmHg. Aortic valve peak gradient measures 12.0 mmHg. Aortic valve area, by VTI measures 2.72 cm. Pulmonic Valve: The pulmonic valve was normal in structure. Pulmonic valve regurgitation is trivial. No evidence of pulmonic stenosis. Aorta: The aortic root is normal in size and structure. Venous: The inferior vena cava is normal in size with greater than 50% respiratory variability, suggesting right atrial pressure of 3 mmHg. IAS/Shunts: No atrial level shunt detected by color flow Doppler.  LEFT VENTRICLE PLAX 2D LVIDd:         4.60 cm     Diastology LVIDs:         3.10 cm     LV e' medial:    9.98 cm/s LV PW:         1.00 cm     LV E/e' medial:  11.8 LV IVS:        0.90 cm     LV e' lateral:   11.50 cm/s LVOT diam:     1.90 cm     LV E/e' lateral: 10.3 LV SV:         78 LV SV Index:   37 LVOT Area:     2.84 cm  LV Volumes (MOD) LV vol d, MOD A2C: 82.3 ml LV vol d, MOD A4C: 78.4 ml LV vol s, MOD A2C: 28.0 ml LV vol s, MOD A4C: 27.4 ml LV SV MOD A2C:     54.3 ml LV SV MOD A4C:     78.4 ml LV SV MOD BP:      53.9 ml RIGHT VENTRICLE             IVC RV Basal diam:  4.20 cm     IVC diam: 1.80 cm RV S prime:     17.50 cm/s TAPSE (M-mode): 2.6 cm LEFT ATRIUM             Index        RIGHT ATRIUM           Index LA diam:        3.70 cm 1.75 cm/m   RA Area:     16.90 cm LA Vol (A2C):   25.2 ml 11.92 ml/m  RA Volume:   46.80 ml  22.14 ml/m LA Vol (A4C):   31.0 ml 14.66 ml/m LA Biplane Vol: 28.9 ml 13.67 ml/m  AORTIC VALVE AV Area (Vmax):    2.70 cm AV Area (Vmean):  2.40 cm AV Area (VTI):     2.72 cm AV Vmax:           173.50 cm/s AV Vmean:          126.000 cm/s AV VTI:            0.286 m AV Peak Grad:      12.0 mmHg AV Mean Grad:      7.0 mmHg LVOT Vmax:         165.50  cm/s LVOT Vmean:        106.500 cm/s LVOT VTI:          0.275 m LVOT/AV VTI ratio: 0.96  AORTA Ao Root diam: 3.10 cm Ao Asc diam:  2.80 cm MITRAL VALVE                TRICUSPID VALVE MV Area (PHT): 3.56 cm     TR Peak grad:   15.7 mmHg MV Area VTI:   3.00 cm     TR Vmax:        198.00 cm/s MV Peak grad:  5.5 mmHg MV Mean grad:  3.0 mmHg     SHUNTS MV Vmax:       1.17 m/s     Systemic VTI:  0.28 m MV Vmean:      74.4 cm/s    Systemic Diam: 1.90 cm MV Decel Time: 213 msec MV E velocity: 118.00 cm/s MV A velocity: 80.60 cm/s MV E/A ratio:  1.46 Arvilla Meres MD Electronically signed by Arvilla Meres MD Signature Date/Time: 07/04/2022/12:21:01 PM    Final    [place] EKG SITE RITE  Result Date: 07/03/2022 If Site Rite image not attached, placement could not be confirmed due to current cardiac rhythm.   Anti-infectives: Anti-infectives (From admission, onward)    Start     Dose/Rate Route Frequency Ordered Stop   07/02/22 1545  metroNIDAZOLE (FLAGYL) IVPB 500 mg        500 mg 100 mL/hr over 60 Minutes Intravenous Every 12 hours 07/02/22 1452     07/02/22 1530  amoxicillin (AMOXIL) capsule 250 mg  Status:  Discontinued        250 mg Oral  Once 07/02/22 1430 07/02/22 1452   07/02/22 1530  metroNIDAZOLE (FLAGYL) tablet 500 mg  Status:  Discontinued        500 mg Oral Every 12 hours 07/02/22 1436 07/02/22 1452   07/02/22 1433  cefTRIAXone (ROCEPHIN) 2 g in sodium chloride 0.9 % 100 mL IVPB        2 g 200 mL/hr over 30 Minutes Intravenous Every 24 hours 07/02/22 1434     07/02/22 1430  metroNIDAZOLE (FLAGYL) IVPB 500 mg  Status:  Discontinued        500 mg 100 mL/hr over 60 Minutes Intravenous Every 12 hours 07/02/22 1400 07/02/22 1436   07/02/22 1200  amoxicillin (AMOXIL) capsule 500 mg  Status:  Discontinued        500 mg Oral  Once 07/02/22 1018 07/02/22 1430   07/02/22 1115  cefTRIAXone (ROCEPHIN) 2 g in sodium chloride 0.9 % 100 mL IVPB  Status:  Discontinued        2 g 200 mL/hr over 30 Minutes  Intravenous Every 24 hours 07/02/22 1018 07/02/22 1434   07/01/22 2000  vancomycin (VANCOREADY) IVPB 1750 mg/350 mL  Status:  Discontinued        1,750 mg 175 mL/hr over 120 Minutes Intravenous Every 36 hours 06/30/22 1801 07/02/22 1018  07/01/22 0000  ceFEPIme (MAXIPIME) 2 g in sodium chloride 0.9 % 100 mL IVPB  Status:  Discontinued        2 g 200 mL/hr over 30 Minutes Intravenous Every 12 hours 06/30/22 1801 07/02/22 1018   06/30/22 2100  vancomycin (VANCOREADY) IVPB 750 mg/150 mL  Status:  Discontinued        750 mg 150 mL/hr over 60 Minutes Intravenous Every 12 hours 06/30/22 0718 06/30/22 1801   06/30/22 0800  vancomycin (VANCOREADY) IVPB 2000 mg/400 mL        2,000 mg 200 mL/hr over 120 Minutes Intravenous NOW 06/30/22 0705 06/30/22 1222   06/30/22 0800  ceFEPIme (MAXIPIME) 2 g in sodium chloride 0.9 % 100 mL IVPB  Status:  Discontinued        2 g 200 mL/hr over 30 Minutes Intravenous Every 8 hours 06/30/22 0708 06/30/22 1801   06/29/22 1800  cefTRIAXone (ROCEPHIN) 2 g in sodium chloride 0.9 % 100 mL IVPB        2 g 200 mL/hr over 30 Minutes Intravenous  Once 06/29/22 1747 06/29/22 1910       Assessment/Plan: POD 5 s/p I&D of perirectal abscess, POD 3/1 debridement NSTI -WBC decreasing -cont abx therapy -cx with Group B strep, ID has seen him -surprised with what appeared to be simple abscess initially with findings yesterday but this is NSTI, need to return to OR today as discussed with wife.   -yesterday no more dead tissue/infection so will not plan on returning to OR yet- daily dressing changes -eventually will need to discuss with urology again for some wound closure   FEN -  CM diet if he starts having a lot of loose stools flexiseal is fine due to wound  VTE - Lovenox ID - maxipime, vanc per ID   Type I diabetes  Emelia Loron 07/05/2022

## 2022-07-05 NOTE — Evaluation (Signed)
Physical Therapy Evaluation Patient Details Name: [name] MRN: [id number] DOB: 1990/05/03 Today's Date: 07/05/2022  History of Present Illness  32-yo male admitted evolving abscess on his buttock and fevers as high as 103, Urgent care referred pt  to [facility] ED where he was further worked up, including an unsuccessful I&D attempt, he was sent to [facility] for admission and CT scan.  CT scan did not describe any soft tissue changes. Surgery was consulted.   found to have necrotizing infection and underwent I&D x2.  Postoperatively he was extubated, but quickly desaturated and was reintubated.  He was transferred to the intensive care unit where PCCM ws consulted. PMH: poorly controlled diabetes and obesity  Clinical Impression  Pt admitted with above diagnosis.  Pt is agreeable to mobilize, limited by pain. Transferred bed <> recliner, unable to tol sitting d/t pain; pt sat in chair long enough to allow PT and family to change bed and then returned to bed; mild dizziness in standing which resolved per pt; family in room and supportive HR max 146 with transfer, O2 sats decr to 86% on RA at rest--O2 replaced at 3L for remainder of session with sats holding in 90s; BP stable. Continue to follow in acute setting, do not think pt will need f/u PT.  Pt currently with functional limitations due to the deficits listed below (see PT Problem List). Pt will benefit from acute skilled PT to increase their independence and safety with mobility to allow discharge.            Recommendations for follow up therapy are one component of a multi-disciplinary discharge planning process, led by the attending physician.  Recommendations may be updated based on patient status, additional functional criteria and insurance authorization.  Follow Up Recommendations       Assistance Recommended at Discharge PRN  Patient can return home with the following  Help with stairs or ramp for entrance    Equipment  Recommendations None recommended by PT  Recommendations for Other Services       Functional Status Assessment Patient has had a recent decline in their functional status and demonstrates the ability to make significant improvements in function in a reasonable and predictable amount of time.     Precautions / Restrictions Precautions Precautions: Fall Restrictions Weight Bearing Restrictions: No      Mobility  Bed Mobility Overal bed mobility: Needs Assistance Bed Mobility: Supine to Sit     Supine to sit: Min assist     General bed mobility comments: incr time d/t pain, assist to elevate trunk to come to sit; assist to elevate LEs on to bed    Transfers Overall transfer level: Needs assistance Equipment used: Rolling Zuba (2 wheels), 2 person hand held assist Transfers: Sit to/from Stand, Bed to chair/wheelchair/BSC Sit to Stand: Min assist, +2 safety/equipment   Step pivot transfers: Min assist, +2 safety/equipment, +2 physical assistance       General transfer comment: assist to rise and stabilize, cues for positioning on ascent/descent; incr time needed d/t pain. STS and SPT x2    Ambulation/Gait               General Gait Details: pivot steps only  Stairs            Wheelchair Mobility    Modified Rankin (Stroke Patients Only)       Balance Overall balance assessment: Needs assistance   Sitting balance-Leahy Scale: Fair Sitting balance - Comments: unable to  wt shift d/t pain   Standing balance support: Reliant on assistive device for balance, During functional activity Standing balance-Leahy Scale: Poor                               Pertinent Vitals/Pain Pain Assessment Pain Assessment: Faces Faces Pain Scale: Hurts worst Pain Location: perineum Pain Descriptors / Indicators: Grimacing, Guarding, Sharp Pain Intervention(s): Limited activity within patient's tolerance, Monitored during session, Premedicated before  session, Repositioned, Utilized relaxation techniques    Home Living Family/patient expects to be discharged to:: Private residence Living Arrangements: Spouse/significant other Available Help at Discharge: Family Type of Home: House Home Access: Stairs to enter   Secretary/administrator of Steps: 2   Home Layout: One level Home Equipment: None      Prior Function Prior Level of Function : Independent/Modified Independent                     Hand Dominance        Extremity/Trunk Assessment   Upper Extremity Assessment Upper Extremity Assessment: Overall WFL for tasks assessed    Lower Extremity Assessment Lower Extremity Assessment: Overall WFL for tasks assessed       Communication      Cognition Arousal/Alertness: Awake/alert Behavior During Therapy: WFL for tasks assessed/performed, Flat affect Overall Cognitive Status: Within Functional Limits for tasks assessed                                          General Comments      Exercises General Exercises - Lower Extremity Ankle Circles/Pumps: AROM, Both, 5 reps Heel Slides:  (encouraged heels slides if pain allows)   Assessment/Plan    PT Assessment Patient needs continued PT services  PT Problem List Decreased strength;Decreased range of motion;Decreased activity tolerance;Decreased mobility;Decreased knowledge of precautions       PT Treatment Interventions Gait training;Functional mobility training;Therapeutic activities;Patient/family education;Therapeutic exercise;DME instruction    PT Goals (Current goals can be found in the Care Plan section)  Acute Rehab PT Goals Patient Stated Goal: less pain PT Goal Formulation: With patient Time For Goal Achievement: 07/19/22 Potential to Achieve Goals: Good    Frequency Min 1X/week     Co-evaluation               AM-PAC PT "6 Clicks" Mobility  Outcome Measure Help needed turning from your back to your side while in a  flat bed without using bedrails?: A Little Help needed moving from lying on your back to sitting on the side of a flat bed without using bedrails?: A Little Help needed moving to and from a bed to a chair (including a wheelchair)?: A Little Help needed standing up from a chair using your arms (e.g., wheelchair or bedside chair)?: A Lot Help needed to walk in hospital room?: A Lot Help needed climbing 3-5 steps with a railing? : A Lot 6 Click Score: 15    End of Session Equipment Utilized During Treatment: Gait belt Activity Tolerance: Patient tolerated treatment well Patient left: with call bell/phone within reach;in bed;with family/visitor present Nurse Communication: Mobility status PT Visit Diagnosis: Other abnormalities of gait and mobility (R26.89);Pain Pain - part of body:  (perineum)    Time: 1610-9604 PT Time Calculation (min) (ACUTE ONLY): 33 min   Charges:   PT Evaluation $PT  Eval Low Complexity: 1 Low PT Treatments $Therapeutic Activity: 8-22 mins        Delice Bison, PT  Acute Rehab Dept [facility]) [phone]  07/05/2022   [facility] 07/05/2022, 4:58 PM

## 2022-07-05 NOTE — Progress Notes (Signed)
NAME:  [name], MRN:  [id number], DOB:  [birth year], LOS: 5 ADMISSION DATE:  06/29/2022, CONSULTATION DATE:  6/5 REFERRING MD:  Dr. Isidoro Donning, CHIEF COMPLAINT:  Fournier's Gangrene    History of Present Illness:  Patient is encephalopathic and/or intubated; therefore, history has been obtained from chart review.  32 year old male with past medical history as below, which is significant for poorly controlled diabetes and obesity.  He presented to urgent care on 6/2 with complaints of an evolving abscess on his buttock and fevers as high as 103.  Urgent care referred him to [facility] emergency department where he was further worked up, including an unsuccessful I&D attempt, however, he was sent to [facility] for admission and CT scan.  CT scan did not describe any soft tissue changes.  Surgery was consulted for evaluation of perirectal abscess and deemed him a surgical candidate for incision and drainage.  This was done in the operating room on 6/3 and culture was sent which went on to grow group B strep.  On 6/5 evolving erythema was noted to the area and he was taken back to the operating room for further exploration.  Ultimately was found to have necrotizing infection and underwent extensive debridement.  Postoperatively he was extubated, but quickly desaturated and was reintubated.  He was transferred to the intensive care unit where PCCM was consulted for ventilator management.  Pertinent  Medical History   has a past medical history of Diabetes mellitus.  Significant Hospital Events: Including procedures, antibiotic start and stop dates in addition to other pertinent events   6/2 presented for buttock abscess 6/3 to OR for I&D, cultures positive for Group B strep  6/5 back to OR for debridement of necrotizing infection. Post op on vent.  6/6 Episode of hypoglycemia, 11L positive for admission  6/7 Self extubated overnight, remains on low dose Precedex and Levo this am  6/8 to OR -  wound irrigated and only minimal debridement necessary. Extubated in the afternoon.  Interim History / Subjective:  Extubated in the afternoon, remains on low dose precedex  Objective   Blood pressure (!) 97/48, pulse 77, temperature 98.4 F (36.9 C), temperature source Oral, resp. rate 16, height 5\' 7"  (1.702 m), weight 100.7 kg, SpO2 94 %. CVP:  [10 mmHg-15 mmHg] 15 mmHg  Vent Mode: PRVC FiO2 (%):  [28 %-66 %] 28 % Set Rate:  [20 bmp] 20 bmp Vt Set:  [520 mL] 520 mL PEEP:  [8 cmH20] 8 cmH20 Plateau Pressure:  [29 cmH20] 29 cmH20   Intake/Output Summary (Last 24 hours) at 07/05/2022 4098 Last data filed at 07/05/2022 0534 Gross per 24 hour  Intake 2654.74 ml  Output 5450 ml  Net -2795.26 ml   Filed Weights   06/29/22 1532 06/30/22 1232  Weight: 100.7 kg 100.7 kg   Examination: General: NAD, sleepy HEENT: [place]/AT, MM pink/moist, PERRL,  Neuro: follows commands, grossly nonfocal CV:RRR, no murmur, rubs, or gallops,  PULM:  diminished bl, no increased work of breathing on [place], no added breath sounds  GI: soft, hypoactive bs, non-tender, non-distended Extremities: warm/dry, no edema  GU: wound to buttocks and perineum covered in dry dressing  Resolved Hospital Problem list   Hypoglycemia   Assessment & Plan:   Severe sepsis due to fournier's gangrene due to Group B strep with AKI and acute hypoxic respiratory failure Perirectal abscess -Now s/p 2 debridements P: Primary management per CCS, appreciate assistance  ID also following, appreciate assistance Continue Ceftriaxone  and Metronidazole  Local wound care    Acute respiratory failure with hypoxia Acute pulmonary edema Good response to diuresis. P: Wean nasal cannula for saturation 92% IS, mobilization as able Diurese as able   Acute metabolic encephalopathy, improving Sepsis and sedation for respiratory failure  P: - wean precedex for rass +1  AKI - Improved  -Secondary to severe  sepsis Hypokalemia Hypophosphatemia P: Trend bmet Monitor urine output Avoid nephrotoxins  DM type 1 -Hgb A1C 12.3 on presentation -Home medications include Lispro and Toujeo Hypoglycemia P: Adjust basal/bolus as needed CBG goal 140-180  MDD/GAD Takes klonopin very rarely at home per wife P: - prn klonopin - home lexapro  Best Practice (right click and "Reselect all SmartList Selections" daily)   Diet/type: Regular consistency (see orders) DVT prophylaxis: LMWH GI prophylaxis: H2B Lines: N/A Foley:  Yes, and it is still needed Code Status:  full code Last date of multidisciplinary goals of care discussion Patient and family updated at bedside   Critical care time:    Performed by: Omar Person  Total critical care time: 32 minutes  Critical care time was exclusive of separately billable procedures and treating other patients.  Critical care was necessary to treat or prevent imminent or life-threatening deterioration.  Critical care was time spent personally by me on the following activities: development of treatment plan with patient and/or surrogate as well as nursing, discussions with consultants, evaluation of patient's response to treatment, examination of patient, obtaining history from patient or surrogate, ordering and performing treatments and interventions, ordering and review of laboratory studies, ordering and review of radiographic studies, pulse oximetry and re-evaluation of patient's condition.  Laroy Apple Pulmonary/Critical Care  Personal contact information can be found on Amion  If no contact or response made please call [phone] 07/05/2022, 7:14 AM

## 2022-07-05 NOTE — Progress Notes (Signed)
[facility] ADULT ICU REPLACEMENT PROTOCOL   The patient does apply for the [facility] Adult ICU Electrolyte Replacment Protocol based on the criteria listed below:   1.Exclusion criteria: TCTS, ECMO, Dialysis, and Myasthenia Gravis patients 2. Is GFR >/= 30 ml/min? Yes.    Patient's GFR today is >60 3. Is SCr </= 2? Yes.   Patient's SCr is 0.94 mg/dL 4. Did SCr increase >/= 0.5 in 24 hours? No. 5.Pt's weight >40kg  Yes.   6. Abnormal electrolyte(s): Potassium 3.4, Magnesium 1.9  7. Electrolytes replaced per protocol 8.  Call MD STAT for K+ </= 2.5, Phos </= 1, or Mag </= 1 Physician:  Dr. Faye Ramsay A Gloriajean Okun 07/05/2022 5:55 AM

## 2022-07-06 ENCOUNTER — Encounter (HOSPITAL_COMMUNITY): Payer: Self-pay | Admitting: Internal Medicine

## 2022-07-06 DIAGNOSIS — N493 Fournier gangrene: Secondary | ICD-10-CM | POA: Diagnosis not present

## 2022-07-06 LAB — BASIC METABOLIC PANEL
Anion gap: 11 (ref 5–15)
BUN: 11 mg/dL (ref 6–20)
CO2: 23 mmol/L (ref 22–32)
Calcium: 8 mg/dL — ABNORMAL LOW (ref 8.9–10.3)
Chloride: 105 mmol/L (ref 98–111)
Creatinine, Ser: 1.11 mg/dL (ref 0.61–1.24)
GFR, Estimated: >60 mL/min (ref >60)
Glucose, Bld: 205 mg/dL — ABNORMAL HIGH (ref 70–99)
Potassium: 4.2 mmol/L (ref 3.5–5.1)
Sodium: 139 mmol/L (ref 135–145)

## 2022-07-06 LAB — GLUCOSE, CAPILLARY
Glucose-Capillary: 235 mg/dL — ABNORMAL HIGH (ref 70–99)
Glucose-Capillary: 272 mg/dL — ABNORMAL HIGH (ref 70–99)
Glucose-Capillary: 265 mg/dL — ABNORMAL HIGH (ref 70–99)
Glucose-Capillary: 254 mg/dL — ABNORMAL HIGH (ref 70–99)

## 2022-07-06 LAB — AEROBIC/ANAEROBIC CULTURE W GRAM STAIN (SURGICAL/DEEP WOUND)

## 2022-07-06 LAB — CBC
HCT: 30.6 % — ABNORMAL LOW (ref 39.0–52.0)
Hemoglobin: 9.7 g/dL — ABNORMAL LOW (ref 13.0–17.0)
MCH: 28.4 pg (ref 26.0–34.0)
MCHC: 31.7 g/dL (ref 30.0–36.0)
MCV: 89.7 fL (ref 80.0–100.0)
Platelets: 474 10*3/uL — ABNORMAL HIGH (ref 150–400)
RBC: 3.41 MIL/uL — ABNORMAL LOW (ref 4.22–5.81)
RDW: 15.1 % (ref 11.5–15.5)
WBC: 15.4 10*3/uL — ABNORMAL HIGH (ref 4.0–10.5)
nRBC: 0 % (ref 0.0–0.2)

## 2022-07-06 LAB — CULTURE, RESPIRATORY W GRAM STAIN: Culture: NO GROWTH

## 2022-07-06 LAB — PHOSPHORUS: Phosphorus: 2.9 mg/dL (ref 2.5–4.6)

## 2022-07-06 LAB — MAGNESIUM: Magnesium: 1.9 mg/dL (ref 1.7–2.4)

## 2022-07-06 MED ORDER — OXYCODONE HCL 5 MG PO TABS
5.0000 mg | ORAL_TABLET | ORAL | Status: DC | PRN
  Administered 2022-07-06 – 2022-07-08 (×6): 10 mg via ORAL
  Administered 2022-07-08: 5 mg via ORAL
  Administered 2022-07-08: 10 mg via ORAL
  Administered 2022-07-09: 5 mg via ORAL
  Administered 2022-07-10: 10 mg via ORAL
  Administered 2022-07-10: 5 mg via ORAL
  Filled 2022-07-06 (×5): qty 2
  Filled 2022-07-06 (×2): qty 1
  Filled 2022-07-06 (×2): qty 2
  Filled 2022-07-06 (×2): qty 1
  Filled 2022-07-06: qty 2

## 2022-07-06 MED ORDER — METHOCARBAMOL 500 MG PO TABS
500.0000 mg | ORAL_TABLET | Freq: Four times a day (QID) | ORAL | Status: DC
  Administered 2022-07-06 – 2022-07-10 (×16): 500 mg via ORAL
  Filled 2022-07-06 (×16): qty 1

## 2022-07-06 MED ORDER — ACETAMINOPHEN 500 MG PO TABS
1000.0000 mg | ORAL_TABLET | Freq: Four times a day (QID) | ORAL | Status: DC
  Administered 2022-07-06 – 2022-07-10 (×14): 1000 mg via ORAL
  Filled 2022-07-06 (×17): qty 2

## 2022-07-06 MED ORDER — PHENOL 1.4 % MT LIQD: 2.0000 | OROMUCOSAL | Status: DC | PRN

## 2022-07-06 MED ORDER — PROMETHAZINE HCL 25 MG PO TABS: 12.5000 mg | ORAL_TABLET | Freq: Once | ORAL | Status: AC

## 2022-07-06 MED ORDER — IBUPROFEN 800 MG PO TABS: 800.0000 mg | ORAL_TABLET | Freq: Four times a day (QID) | ORAL | Status: DC | PRN

## 2022-07-06 MED ORDER — ESCITALOPRAM OXALATE 10 MG PO TABS: 5.0000 mg | ORAL_TABLET | Freq: Every day | ORAL | Status: DC

## 2022-07-06 MED ORDER — GABAPENTIN 100 MG PO CAPS
200.0000 mg | ORAL_CAPSULE | Freq: Three times a day (TID) | ORAL | Status: DC
  Administered 2022-07-06: 200 mg via ORAL
  Filled 2022-07-06: qty 2

## 2022-07-06 MED ORDER — METHOCARBAMOL 1000 MG/10ML IJ SOLN: 1000.0000 mg | Freq: Four times a day (QID) | INTRAVENOUS | Status: DC | PRN

## 2022-07-06 MED ORDER — INSULIN DETEMIR 100 UNIT/ML ~~LOC~~ SOLN
18.0000 [IU] | Freq: Two times a day (BID) | SUBCUTANEOUS | Status: DC
  Administered 2022-07-06 – 2022-07-07 (×3): 18 [IU] via SUBCUTANEOUS
  Filled 2022-07-06 (×4): qty 0.18

## 2022-07-06 MED ORDER — LIP MEDEX EX OINT
TOPICAL_OINTMENT | Freq: Two times a day (BID) | CUTANEOUS | Status: DC
  Filled 2022-07-06 (×2): qty 7

## 2022-07-06 MED ORDER — HYDROMORPHONE HCL 1 MG/ML IJ SOLN
0.5000 mg | INTRAMUSCULAR | Status: DC | PRN
  Administered 2022-07-06: 2 mg via INTRAVENOUS
  Administered 2022-07-06: 1 mg via INTRAVENOUS
  Administered 2022-07-07 – 2022-07-10 (×4): 2 mg via INTRAVENOUS
  Filled 2022-07-06: qty 2
  Filled 2022-07-06: qty 1
  Filled 2022-07-06 (×5): qty 2

## 2022-07-06 MED ORDER — FUROSEMIDE 40 MG PO TABS
40.0000 mg | ORAL_TABLET | Freq: Once | ORAL | Status: AC
  Administered 2022-07-06: 40 mg via ORAL
  Filled 2022-07-06: qty 1

## 2022-07-06 MED ORDER — MAGIC MOUTHWASH: 15.0000 mL | Freq: Four times a day (QID) | ORAL | Status: DC | PRN

## 2022-07-06 MED ORDER — CLONAZEPAM 0.5 MG PO TABS
0.5000 mg | ORAL_TABLET | Freq: Two times a day (BID) | ORAL | Status: DC | PRN
  Administered 2022-07-06: 0.5 mg via ORAL
  Filled 2022-07-06: qty 1

## 2022-07-06 MED ORDER — KETOROLAC TROMETHAMINE 15 MG/ML IJ SOLN: 15.0000 mg | Freq: Four times a day (QID) | INTRAMUSCULAR | Status: DC | PRN

## 2022-07-06 MED ORDER — IBUPROFEN 200 MG PO TABS: 400.0000 mg | ORAL_TABLET | Freq: Four times a day (QID) | ORAL | Status: DC | PRN

## 2022-07-06 MED ORDER — METHOCARBAMOL 500 MG PO TABS: 1000.0000 mg | ORAL_TABLET | Freq: Four times a day (QID) | ORAL | Status: DC | PRN

## 2022-07-06 MED ORDER — SODIUM CHLORIDE 0.9 % IV SOLN
12.5000 mg | Freq: Once | INTRAVENOUS | Status: AC
  Administered 2022-07-06: 12.5 mg via INTRAVENOUS
  Filled 2022-07-06: qty 12.5

## 2022-07-06 MED ORDER — SIMETHICONE 40 MG/0.6ML PO SUSP: 80.0000 mg | Freq: Four times a day (QID) | ORAL | Status: DC | PRN

## 2022-07-06 MED ORDER — ALUM & MAG HYDROXIDE-SIMETH 200-200-20 MG/5ML PO SUSP: 30.0000 mL | Freq: Four times a day (QID) | ORAL | Status: DC | PRN

## 2022-07-06 MED ORDER — ESCITALOPRAM OXALATE 10 MG PO TABS
10.0000 mg | ORAL_TABLET | Freq: Every day | ORAL | Status: DC
  Administered 2022-07-06 – 2022-07-09 (×4): 10 mg via ORAL
  Filled 2022-07-06 (×4): qty 1

## 2022-07-06 MED ORDER — FENTANYL CITRATE PF 50 MCG/ML IJ SOSY: 50.0000 ug | PREFILLED_SYRINGE | Freq: Every day | INTRAMUSCULAR | Status: DC | PRN

## 2022-07-06 MED ORDER — MENTHOL 3 MG MT LOZG: 1.0000 | LOZENGE | OROMUCOSAL | Status: DC | PRN

## 2022-07-06 NOTE — Progress Notes (Signed)
   07/06/22 1045  Assess: MEWS Score  Temp 98.6 F (37 C)  BP 131/65  MAP (mmHg) 82  Pulse Rate (!) 118  Resp 14  SpO2 95 %  O2 Device Room Air  Assess: MEWS Score  MEWS Temp 0  MEWS Systolic 0  MEWS Pulse 2  MEWS RR 0  MEWS LOC 0  MEWS Score 2  MEWS Score Color Yellow  Assess: if the MEWS score is Yellow or Red  Were vital signs taken at a resting state? Yes  Focused Assessment No change from prior assessment  Does the patient meet 2 or more of the SIRS criteria? Yes  Does the patient have a confirmed or suspected source of infection? No  MEWS guidelines implemented  Yes, yellow  Treat  MEWS Interventions Considered administering scheduled or prn medications/treatments as ordered  Take Vital Signs  Increase Vital Sign Frequency  Yellow: Q2hr x1, continue Q4hrs until patient remains green for 12hrs  Escalate  MEWS: Escalate Yellow: Discuss with charge nurse and consider notifying provider and/or RRT  Notify: Charge Nurse/RN  Name of Charge Nurse/RN Notified Jessica, RN  Assess: SIRS CRITERIA  SIRS Temperature  0  SIRS Pulse 1  SIRS Respirations  0  SIRS WBC 0  SIRS Score Sum  1

## 2022-07-06 NOTE — Progress Notes (Addendum)
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 Post op on vent----ID consulted narrowed off of vancomycin and placed on ceftriaxone + Flagyl secondary to worsening--plan for discharge on oral Augmentin 6/6 Episode of hypoglycemia, 11L positive for admission  6/7 Self extubated overnight, remains on low dose Precedex and Levo this am  6/8 cardiology consulted 2/2 lateral ST elevation no reciprocal changes bedside echo normal LV RV-dynamic changes they signed off 6/8 to OR - wound irrigated and only minimal debridement necessary. Extubated in the afternoon.  Hospital-Problem based course  Sepsis 2/2 buttock abscess/Fournier's gangrene status post OR repair and multiple debridements Growing group B strep Defer to general surgery/urology complex wound repair--will discuss with Dr. 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 Fransico Him now that he has changed jobs and works in a different environment where the pump will be able to stay on CBGs here ranging in the 170s to 230s Levemir 15-->18, sliding scale  Reciprocal EKG changes Cardiology saw and signed off  Situational depression as above Resumed Lexapro 10 and clonazepam 0.5 but give as twice daily given his significant anxiety-cut back to regular dose of 0.5 daily closer to discharge   DVT prophylaxis: Lovenox Code Status: Full  family Communication: Discussed in detail plan of care with wife at the bedside Disposition:  Status is: Inpatient Remains inpatient appropriate because: Requires further management    Subjective: Awake alert coherent no distress  Objective: Vitals:   07/06/22 0352 07/06/22 0400 07/06/22 0617 07/06/22 0852  BP:  (!) 143/72 134/70 121/64  Pulse: 90 92 (!) 108 (!) 110  Resp: 16 18 18 20   Temp: 98.1 F (36.7 C)  98.6 F (37 C)   TempSrc:   Oral   SpO2: 96% 97% 95% 95%  Weight:      Height:        Intake/Output Summary (Last 24 hours) at 07/06/2022 0922 Last data filed at 07/06/2022 0515 Gross per 24 hour  Intake 1328.05 ml  Output 2150 ml  Net -821.95 ml   Filed Weights   06/29/22 1532  06/30/22 1232  Weight: 100.7 kg 100.7 kg    Examination:  EOMI NCAT anxious appearing white male no focal deficit no icterus no pallor ROM intact CTAB no added sound Abdomen soft no rebound Flexi-Seal in place, Foley catheter in place Has 1+ lower extremity pitting edema  Data Reviewed: personally reviewed   CBC    Component  Value Date/Time   WBC 15.4 (H) 07/06/2022 0402   RBC 3.41 (L) 07/06/2022 0402   HGB 9.7 (L) 07/06/2022 0402   HCT 30.6 (L) 07/06/2022 0402   PLT 474 (H) 07/06/2022 0402   MCV 89.7 07/06/2022 0402   MCH 28.4 07/06/2022 0402   MCHC 31.7 07/06/2022 0402   RDW 15.1 07/06/2022 0402   LYMPHSABS 0.9 07/01/2022 0429   MONOABS 1.6 (H) 07/01/2022 0429   EOSABS 0.1 07/01/2022 0429   BASOSABS 0.1 07/01/2022 0429      Latest Ref Rng & Units 07/06/2022    4:02 AM 07/05/2022    4:24 AM 07/04/2022    5:59 AM  CMP  Glucose 70 - 99 mg/dL 829  562  130   BUN 6 - 20 mg/dL 11  13  18    Creatinine 0.61 - 1.24 mg/dL 8.65  7.84  6.96   Sodium 135 - 145 mmol/L 139  140  140   Potassium 3.5 - 5.1 mmol/L 4.2  3.4  3.5   Chloride 98 - 111 mmol/L 105  107  108   CO2 22 - 32 mmol/L 23  24  22    Calcium 8.9 - 10.3 mg/dL 8.0  7.2  7.5      Radiology Studies: ECHOCARDIOGRAM COMPLETE  Result Date: 07/04/2022    ECHOCARDIOGRAM REPORT   Patient Name:   [name] Date of Exam: 07/04/2022 Medical Rec #:  [id number]     Height:       67.0 in Accession #:    [id number]    Weight:       222.0 lb Date of Birth:  1990/07/05     BSA:          2.114 m Patient Age:    32 years      BP:           122/70 mmHg Patient Gender: M             HR:           98 bpm. Exam Location:  Inpatient Procedure: 2D Echo, Cardiac Doppler and Color Doppler Indications:    Dyspnea  History:        Patient has no prior history of Echocardiogram examinations.                 Signs/Symptoms:Fever and acute respiratory failure; Risk                 Factors:Diabetes and abscess, sepsis.  Sonographer:    Wallie Char Referring Phys: [id number] Clarene Critchley [facility]  Sonographer Comments: Echo performed with patient supine and on artificial respirator, patient is obese and Technically challenging study due to limited acoustic windows. Image acquisition challenging due to patient body habitus. IMPRESSIONS  1. Left ventricular ejection fraction, by estimation, is 60 to  65%. The left ventricle has normal function. The left ventricle has no regional wall motion abnormalities. Left ventricular diastolic parameters were normal.  2. Right ventricular systolic function is normal. The right ventricular size is mildly enlarged.  3. The mitral valve is normal in structure. No evidence of mitral valve regurgitation.  No evidence of mitral stenosis.  4. The aortic valve is normal in structure. Aortic valve regurgitation is not visualized. No aortic stenosis is present.  5. The inferior vena cava is normal in size with greater than 50% respiratory variability, suggesting right atrial pressure of 3 mmHg. FINDINGS  Left Ventricle: Left ventricular ejection fraction, by estimation, is 60 to 65%. The left ventricle has normal function. The left ventricle has no regional wall motion abnormalities. The left ventricular internal cavity size was normal in size. There is  no left ventricular hypertrophy. Left ventricular diastolic parameters were normal. Right Ventricle: The right ventricular size is mildly enlarged. No increase in right ventricular wall thickness. Right ventricular systolic function is normal. Left Atrium: Left atrial size was normal in size. Right Atrium: Right atrial size was normal in size. Pericardium: There is no evidence of pericardial effusion. Mitral Valve: The mitral valve is normal in structure. No evidence of mitral valve regurgitation. No evidence of mitral valve stenosis. MV peak gradient, 5.5 mmHg. The mean mitral valve gradient is 3.0 mmHg. Tricuspid Valve: The tricuspid valve is normal in structure. Tricuspid valve regurgitation is trivial. No evidence of tricuspid stenosis. Aortic Valve: The aortic valve is normal in structure. Aortic valve regurgitation is not visualized. No aortic stenosis is present. Aortic valve mean gradient measures 7.0 mmHg. Aortic valve peak gradient measures 12.0 mmHg. Aortic valve area, by VTI measures 2.72 cm. Pulmonic Valve: The pulmonic  valve was normal in structure. Pulmonic valve regurgitation is trivial. No evidence of pulmonic stenosis. Aorta: The aortic root is normal in size and structure. Venous: The inferior vena cava is normal in size with greater than 50% respiratory variability, suggesting right atrial pressure of 3 mmHg. IAS/Shunts: No atrial level shunt detected by color flow Doppler.  LEFT VENTRICLE PLAX 2D LVIDd:         4.60 cm     Diastology LVIDs:         3.10 cm     LV e' medial:    9.98 cm/s LV PW:         1.00 cm     LV E/e' medial:  11.8 LV IVS:        0.90 cm     LV e' lateral:   11.50 cm/s LVOT diam:     1.90 cm     LV E/e' lateral: 10.3 LV SV:         78 LV SV Index:   37 LVOT Area:     2.84 cm  LV Volumes (MOD) LV vol d, MOD A2C: 82.3 ml LV vol d, MOD A4C: 78.4 ml LV vol s, MOD A2C: 28.0 ml LV vol s, MOD A4C: 27.4 ml LV SV MOD A2C:     54.3 ml LV SV MOD A4C:     78.4 ml LV SV MOD BP:      53.9 ml RIGHT VENTRICLE             IVC RV Basal diam:  4.20 cm     IVC diam: 1.80 cm RV S prime:     17.50 cm/s TAPSE (M-mode): 2.6 cm LEFT ATRIUM             Index        RIGHT ATRIUM           Index LA diam:        3.70 cm 1.75 cm/m   RA Area:     16.90 cm LA Vol (A2C):  25.2 ml 11.92 ml/m  RA Volume:   46.80 ml  22.14 ml/m LA Vol (A4C):   31.0 ml 14.66 ml/m LA Biplane Vol: 28.9 ml 13.67 ml/m  AORTIC VALVE AV Area (Vmax):    2.70 cm AV Area (Vmean):   2.40 cm AV Area (VTI):     2.72 cm AV Vmax:           173.50 cm/s AV Vmean:          126.000 cm/s AV VTI:            0.286 m AV Peak Grad:      12.0 mmHg AV Mean Grad:      7.0 mmHg LVOT Vmax:         165.50 cm/s LVOT Vmean:        106.500 cm/s LVOT VTI:          0.275 m LVOT/AV VTI ratio: 0.96  AORTA Ao Root diam: 3.10 cm Ao Asc diam:  2.80 cm MITRAL VALVE                TRICUSPID VALVE MV Area (PHT): 3.56 cm     TR Peak grad:   15.7 mmHg MV Area VTI:   3.00 cm     TR Vmax:        198.00 cm/s MV Peak grad:  5.5 mmHg MV Mean grad:  3.0 mmHg     SHUNTS MV Vmax:       1.17 m/s      Systemic VTI:  0.28 m MV Vmean:      74.4 cm/s    Systemic Diam: 1.90 cm MV Decel Time: 213 msec MV E velocity: 118.00 cm/s MV A velocity: 80.60 cm/s MV E/A ratio:  1.46 Arvilla Meres MD Electronically signed by Arvilla Meres MD Signature Date/Time: 07/04/2022/12:21:01 PM    Final      Scheduled Meds:  acetaminophen  1,000 mg Oral Q6H   Or   acetaminophen (TYLENOL) oral liquid 160 mg/5 mL  1,000 mg Oral Q6H   Chlorhexidine Gluconate Cloth  6 each Topical Daily   docusate sodium  100 mg Oral BID   enoxaparin (LOVENOX) injection  40 mg Subcutaneous Q24H   escitalopram  5 mg Oral QHS   famotidine  20 mg Oral BID   HYDROmorphone   Intravenous Q4H   insulin aspart  0-20 Units Subcutaneous TID WC   insulin aspart  0-5 Units Subcutaneous QHS   insulin detemir  15 Units Subcutaneous BID   polyethylene glycol  17 g Oral Daily   sodium chloride flush  10-40 mL Intracatheter Q12H   sodium chloride flush  3 mL Intravenous Q12H   Continuous Infusions:  sodium chloride Stopped (07/06/22 0357)   sodium chloride Stopped (07/06/22 0357)   cefTRIAXone (ROCEPHIN)  IV Stopped (07/05/22 1524)   dexmedetomidine (PRECEDEX) IV infusion Stopped (07/05/22 0944)   feeding supplement (VITAL 1.5 CAL) Stopped (07/03/22 2330)   metronidazole Stopped (07/06/22 0515)   norepinephrine (LEVOPHED) Adult infusion Stopped (07/04/22 1418)     LOS: 6 days   Time spent: 64  Rhetta Mura, MD [facility] To contact the attending provider between 7A-7P or the covering provider during after hours 7P-7A, please log into the web site [email or web address] and access using universal [place] password for that web site. If you do not have the password, please call the hospital operator.  07/06/2022, 9:22 AM

## 2022-07-06 NOTE — Progress Notes (Signed)
07/06/2022  [name] [id number] November 18, 1990  CARE TEAM: PCP: Roe Rutherford, NP  Outpatient Care Team: Patient Care Team: Roe Rutherford, NP as PCP - General (Adult Health Nurse Practitioner)  Inpatient Treatment Team: Treatment Team: Attending Provider: Rhetta Mura, MD; Consulting Physician: Montez Morita Md, MD; Consulting Physician: Loletta Parish., MD; Referring Physician: Omar Person, MD; Rounding Team: Shanda Howells, MD; Registered Nurse: Bettey Mare, RN; Utilization Review: Clydia Llano, RN; Social Worker: Larrie Kass, LCSW   Problem List:   Principal Problem:   Fournier's gangrene in male Active Problems:   Sepsis (HCC)   Perirectal abscess   AKI (acute kidney injury) (HCC)   Type 2 diabetes mellitus with hyperglycemia, with long-term current use of insulin (HCC)   Scrotal edema   Acute respiratory failure with hypoxia (HCC)   06/30/2022  Preoperative diagnosis: perirectal abscess Postoperative diagnosis: same as above Procedure: Incision and drainage of perirectal abscess  Surgeon: Dr Harden Mo  07/02/2022  Excisional debridement:   1.  Progress note or procedure note with a detailed description of the procedure. Preoperative diagnosis: perirectal abscess Postoperative diagnosis: NSTI perineum Procedure:Debridement perineum and scrotum, 20x8x8 cm  Dr Harden Mo  07/04/2022  Excisional debridement:   1.  Progress note or procedure note with a detailed description of the procedure. Preoperative diagnosis: perirectal abscess Postoperative diagnosis: NSTI perineum Procedure:dressing change, minimal superficial     Assessment [facility] Stay = 6 days) 2 Days Post-Op    Stabilizing    Plan:  POD 6/4/2 s/p I&D of perirectal abscess/Fournier's gangrene (NSTI) -cont abx therapy -ID prefers ceftriaxone/Flagyl given penicillin allergy.  Usually stop after 5 days from last surgery. -cx with Group B strep, ID  has seen him  Wet-to-dry dressing changes at least daily and follow.  Reexplore if needed.-No more dead tissue at this point.   Foley catheter and come out from surgery standpoint seen by urology.  I think they are okay if the patient can start mobilizing.  Diuresis x 1 since total fluid overloaded.  Try and get Flexi-Seal out as well.  Simplfy bowel regimen just to MiraLAX to help thicken stools.  Stop Colace with diarrhea.  Dr. Dwain Sarna wondering if urology feels like patient would benefit from some partial closure given the large scrotal wound.  Urologist this weeknd noted sasfer to let close down secondarily.  Defer to them.    Adjust pain control.  Patient now on PCA.  Place on standing acetaminophen/gabapentin/methocarbinol.  Can have ibuprofen and oxycodone for breakthrough.  Try and wean off PCA   FEN -  Solid diet.  Wean off IVFs.  VTE - Lovenox  Needs to mobilize more.  Hopefully we can improve his pain control & removed tubes so we can start doing that.  See if physical Occupational Therapy can evaluate and have insights for transferring or need for equipment  ID -per ID.  Usually stop 5 days after last debridement.  Current regimen ceftriaxone/metronidazole    Type I diabetes very poorly controlled.  Needs to get better controlled regimen.  Hopefully can break the cycle of soft tissue infections       I reviewed nursing notes, Consultant ID notes, hospitalist notes, last 24 h vitals and pain scores, last 48 h intake and output, last 24 h labs and trends, and last 24 h imaging results.  I have reviewed this patient's available data, including medical history, events of note, test results, etc as part of my evaluation.   A significant  portion of that time was spent in counseling. Care during the described time interval was provided by me.  This care required moderate level of medical decision making.  07/06/2022    Subjective: (Chief complaint)  Dr. Mena Goes in  urology and family in room.  Patient still with pain and discomfort.  Had diarrhea with Colace and MiraLAX so Flexi-Seal placed.  Objective:  Vital signs:  Vitals:   07/06/22 0352 07/06/22 0400 07/06/22 0617 07/06/22 0852  BP:  (!) 143/72 134/70 121/64  Pulse: 90 92 (!) 108 (!) 110  Resp: 16 18 18 20   Temp: 98.1 F (36.7 C)  98.6 F (37 C)   TempSrc:   Oral   SpO2: 96% 97% 95% 95%  Weight:      Height:        Last BM Date : 07/05/22  Intake/Output   Yesterday:  06/08 0701 - 06/09 0700 In: 1573.6 [P.O.:650; I.V.:546.4; IV Piggyback:347.1] Out: 2300 [Urine:2175; Stool:125] This shift:  No intake/output data recorded.  Bowel function:  Flatus: YES  BM:  YES  Drain: (No drain)   Physical Exam:  General: Pt awake/alert in mild acute distress Eyes: PERRL, normal EOM.  Sclera clear.  No icterus Neuro: CN II-XII intact w/o focal sensory/motor deficits. Lymph: No head/neck/groin lymphadenopathy Psych:  No delerium/psychosis/paranoia.  Oriented x 4 HENT: Normocephalic, Mucus membranes moist.  No thrush Neck: Supple, No tracheal deviation.  No obvious thyromegaly Chest: No pain to chest wall compression.  Good respiratory excursion.  No audible wheezing CV:  Pulses intact.  Regular rhythm.  No major extremity edema MS: Normal AROM mjr joints.  No obvious deformity Abdomen: Soft.  Nondistended.  Nontender.  No evidence of peritonitis.  No incarcerated hernias.  GU: Foley catheter in place with clear colorless healing.  Left-sided groin/scrotal wound clean without any progression of any cellulitis or crepitus reassuring.  Rectal Flexi-Seal in place with thin stool.  Ext:  No deformity.  No mjr edema.  No cyanosis Skin: No petechiae / purpurea.  No major sores.  Warm and dry    Results:   Cultures: Recent Results (from the past 720 hour(s))  Blood Culture (routine x 2)     Status: None   Collection Time: 06/29/22  3:46 PM   Specimen: BLOOD RIGHT HAND  Result  Value Ref Range Status   Specimen Description   Final    BLOOD RIGHT HAND BOTTLES DRAWN AEROBIC AND ANAEROBIC   Special Requests Blood Culture adequate volume  Final   Culture   Final    NO GROWTH 5 DAYS Performed at [facility], [address], [place], [place] [address]    Report Status 07/04/2022 FINAL  Final  Blood Culture (routine x 2)     Status: None   Collection Time: 06/29/22  3:55 PM   Specimen: Right Antecubital; Blood  Result Value Ref Range Status   Specimen Description   Final    RIGHT ANTECUBITAL BOTTLES DRAWN AEROBIC AND ANAEROBIC   Special Requests   Final    Blood Culture results may not be optimal due to an excessive volume of blood received in culture bottles   Culture   Final    NO GROWTH 5 DAYS Performed at [facility], [address] [place], [place] [address]    Report Status 07/04/2022 FINAL  Final  Aerobic Culture w Gram Stain (superficial specimen)     Status: None   Collection Time: 06/29/22  5:00 PM   Specimen: Abscess  Result  Value Ref Range Status   Specimen Description   Final    ABSCESS Performed at [facility], [address], [place], [place] [address]    Special Requests   Final    NONE Performed at [facility], [address] [place], [place] [address]    Gram Stain   Final    RARE WBC PRESENT, PREDOMINANTLY PMN NO ORGANISMS SEEN    Culture   Final    RARE NORMAL SKIN FLORA Performed at [facility], [address] [place], [place] [address]    Report Status 07/02/2022 FINAL  Final  MRSA Next Gen by PCR, Nasal     Status: None   Collection Time: 06/30/22  8:37 AM   Specimen: Nasal Mucosa; Nasal Swab  Result Value Ref Range Status   MRSA by PCR Next Gen NOT DETECTED NOT DETECTED Final    Comment: (NOTE) The GeneXpert MRSA Assay (FDA approved for NASAL specimens only), is one component of a comprehensive MRSA colonization surveillance program. It is not intended to diagnose MRSA infection nor to guide or monitor treatment  for MRSA infections. Test performance is not FDA approved in patients less than 52 years old. Performed at [facility], [address], [place], [place] [address]   Culture, blood (Routine X 2) w Reflex to ID Panel     Status: None   Collection Time: 06/30/22  9:08 AM   Specimen: BLOOD  Result Value Ref Range Status   Specimen Description   Final    BLOOD BLOOD LEFT HAND Performed at [facility], [address], [place], [place] [address]    Special Requests   Final    BOTTLES DRAWN AEROBIC ONLY Blood Culture adequate volume Performed at [facility], [address] [place], [place] [address]    Culture   Final    NO GROWTH 5 DAYS Performed at [facility], [address] [place], [place] [address]    Report Status 07/05/2022 FINAL  Final  Culture, blood (Routine X 2) w Reflex to ID Panel     Status: None   Collection Time: 06/30/22 10:05 AM   Specimen: BLOOD RIGHT HAND  Result Value Ref Range Status   Specimen Description   Final    BLOOD RIGHT HAND Performed at [facility], [address] [place], [place] [address]    Special Requests   Final    AEROBIC BOTTLE ONLY Blood Culture adequate volume Performed at [facility], [address] [place], [place] [address]    Culture   Final    NO GROWTH 5 DAYS Performed at [facility], [address] [place], [place] [address]    Report Status 07/05/2022 FINAL  Final  Aerobic/Anaerobic Culture w Gram Stain (surgical/deep wound)     Status: None (Preliminary result)   Collection Time: 06/30/22  1:39 PM   Specimen: Path fluid; Abscess  Result Value Ref Range Status   Specimen Description ABSCESS PERIRECTAL  Final   Special Requests NONE  Final   Gram Stain   Final    NO WBC SEEN MODERATE GRAM POSITIVE COCCI MODERATE GRAM NEGATIVE RODS RARE GRAM POSITIVE RODS    Culture   Final    RARE GROUP B  STREP(S.AGALACTIAE)ISOLATED TESTING AGAINST S. AGALACTIAE NOT ROUTINELY PERFORMED DUE TO PREDICTABILITY OF AMP/PEN/VAN SUSCEPTIBILITY. FEW PEPTOSTREPTOCOCCUS ANAEROBIUS Standardized susceptibility testing for this organism is not available. CULTURE REINCUBATED FOR BETTER GROWTH  Report Status PENDING  Incomplete  Culture, Respiratory w Gram Stain     Status: None (Preliminary result)   Collection Time: 07/03/22 10:36 AM   Specimen: Tracheal Aspirate; Respiratory  Result Value Ref Range Status   Specimen Description   Final    TRACHEAL ASPIRATE Performed at [facility], [address] [place], [place] [address]    Special Requests   Final    NONE Performed at [facility], [address] [place], [place] [address]    Gram Stain   Final    ABUNDANT WBC PRESENT,BOTH PMN AND MONONUCLEAR NO ORGANISMS SEEN    Culture   Final    NO GROWTH 2 DAYS Performed at [facility], [address], [place], [place] [address]    Report Status PENDING  Incomplete    Labs: Results for orders placed or performed during the hospital encounter of 06/29/22 (from the past 48 hour(s))  Blood gas, venous     Status: Abnormal   Collection Time: 07/04/22 11:25 AM  Result Value Ref Range   pH, Ven 7.34 7.25 - 7.43   pCO2, Ven 44 44 - 60 mmHg   pO2, Ven 45 32 - 45 mmHg   Bicarbonate 23.7 20.0 - 28.0 mmol/L   Acid-base deficit 2.2 (H) 0.0 - 2.0 mmol/L   O2 Saturation 82.8 %   Patient temperature 37.0     Comment: Performed at [facility], [address], [place], [place] [address]  Glucose, capillary     Status: Abnormal   Collection Time: 07/04/22 12:53 PM  Result Value Ref Range   Glucose-Capillary 258 (H) 70 - 99 mg/dL    Comment: Glucose reference range applies only to samples taken after fasting for at least 8 hours.   Comment 1 Notify RN    Comment 2 Document in Chart   Glucose, capillary     Status: Abnormal   Collection  Time: 07/04/22  4:43 PM  Result Value Ref Range   Glucose-Capillary 202 (H) 70 - 99 mg/dL    Comment: Glucose reference range applies only to samples taken after fasting for at least 8 hours.   Comment 1 Notify RN   Magnesium     Status: None   Collection Time: 07/04/22  5:00 PM  Result Value Ref Range   Magnesium 1.7 1.7 - 2.4 mg/dL    Comment: Performed at [facility], [address], [place], [place] [address]  Phosphorus     Status: None   Collection Time: 07/04/22  5:00 PM  Result Value Ref Range   Phosphorus 2.9 2.5 - 4.6 mg/dL    Comment: Performed at [facility], [address] [place], [place] [address]  Glucose, capillary     Status: Abnormal   Collection Time: 07/04/22  9:37 PM  Result Value Ref Range   Glucose-Capillary 122 (H) 70 - 99 mg/dL    Comment: Glucose reference range applies only to samples taken after fasting for at least 8 hours.  Glucose, capillary     Status: None   Collection Time: 07/05/22  4:21 AM  Result Value Ref Range   Glucose-Capillary 99 70 - 99 mg/dL    Comment: Glucose reference range applies only to samples taken after fasting for at least 8 hours.  Basic metabolic panel     Status: Abnormal   Collection Time: 07/05/22  4:24 AM  Result Value Ref Range   Sodium 140 135 -  145 mmol/L   Potassium 3.4 (L) 3.5 - 5.1 mmol/L   Chloride 107 98 - 111 mmol/L   CO2 24 22 - 32 mmol/L   Glucose, Bld 118 (H) 70 - 99 mg/dL    Comment: Glucose reference range applies only to samples taken after fasting for at least 8 hours.   BUN 13 6 - 20 mg/dL   Creatinine, Ser 4.09 0.61 - 1.24 mg/dL   Calcium 7.2 (L) 8.9 - 10.3 mg/dL   GFR, Estimated >81 >19 mL/min    Comment: (NOTE) Calculated using the CKD-EPI Creatinine Equation (2021)    Anion gap 9 5 - 15    Comment: Performed at [facility], [address] [place], [place] [address]  Magnesium     Status: None   Collection Time: 07/05/22  4:24 AM   Result Value Ref Range   Magnesium 1.9 1.7 - 2.4 mg/dL    Comment: Performed at [facility], [address], [place], [place] [address]  Phosphorus     Status: None   Collection Time: 07/05/22  4:24 AM  Result Value Ref Range   Phosphorus 3.0 2.5 - 4.6 mg/dL    Comment: Performed at [facility] [address] [place], [place] [address]  CBC     Status: Abnormal   Collection Time: 07/05/22  4:24 AM  Result Value Ref Range   WBC 12.6 (H) 4.0 - 10.5 K/uL   RBC 3.29 (L) 4.22 - 5.81 MIL/uL   Hemoglobin 9.2 (L) 13.0 - 17.0 g/dL   HCT 78.4 (L) 69.6 - 29.5 %   MCV 89.1 80.0 - 100.0 fL   MCH 28.0 26.0 - 34.0 pg   MCHC 31.4 30.0 - 36.0 g/dL   RDW 28.4 13.2 - 44.0 %   Platelets 344 150 - 400 K/uL   nRBC 0.0 0.0 - 0.2 %    Comment: Performed at [facility], [address], [place], [place] [address]  Glucose, capillary     Status: Abnormal   Collection Time: 07/05/22  7:47 AM  Result Value Ref Range   Glucose-Capillary 135 (H) 70 - 99 mg/dL    Comment: Glucose reference range applies only to samples taken after fasting for at least 8 hours.  Glucose, capillary     Status: Abnormal   Collection Time: 07/05/22 11:49 AM  Result Value Ref Range   Glucose-Capillary 171 (H) 70 - 99 mg/dL    Comment: Glucose reference range applies only to samples taken after fasting for at least 8 hours.   Comment 1 Notify RN    Comment 2 Document in Chart   Glucose, capillary     Status: Abnormal   Collection Time: 07/05/22  5:04 PM  Result Value Ref Range   Glucose-Capillary 202 (H) 70 - 99 mg/dL    Comment: Glucose reference range applies only to samples taken after fasting for at least 8 hours.   Comment 1 Notify RN    Comment 2 Document in Chart   Glucose, capillary     Status: Abnormal   Collection Time: 07/05/22  8:57 PM  Result Value Ref Range   Glucose-Capillary 172 (H) 70 - 99 mg/dL    Comment: Glucose reference range applies only to  samples taken after fasting for at least 8 hours.  Basic metabolic panel     Status: Abnormal   Collection Time: 07/06/22  4:02 AM  Result Value Ref Range   Sodium 139 135 -  145 mmol/L   Potassium 4.2 3.5 - 5.1 mmol/L   Chloride 105 98 - 111 mmol/L   CO2 23 22 - 32 mmol/L   Glucose, Bld 205 (H) 70 - 99 mg/dL    Comment: Glucose reference range applies only to samples taken after fasting for at least 8 hours.   BUN 11 6 - 20 mg/dL   Creatinine, Ser 6.64 0.61 - 1.24 mg/dL   Calcium 8.0 (L) 8.9 - 10.3 mg/dL   GFR, Estimated >40 >34 mL/min    Comment: (NOTE) Calculated using the CKD-EPI Creatinine Equation (2021)    Anion gap 11 5 - 15    Comment: Performed at [facility], [address] [place], [place] [address]  Magnesium     Status: None   Collection Time: 07/06/22  4:02 AM  Result Value Ref Range   Magnesium 1.9 1.7 - 2.4 mg/dL    Comment: Performed at [facility], [address], [place], [place] [address]  Phosphorus     Status: None   Collection Time: 07/06/22  4:02 AM  Result Value Ref Range   Phosphorus 2.9 2.5 - 4.6 mg/dL    Comment: Performed at [facility], [address], [place], [place] [address]  CBC     Status: Abnormal   Collection Time: 07/06/22  4:02 AM  Result Value Ref Range   WBC 15.4 (H) 4.0 - 10.5 K/uL   RBC 3.41 (L) 4.22 - 5.81 MIL/uL   Hemoglobin 9.7 (L) 13.0 - 17.0 g/dL   HCT 29.5 (L) 18.8 - 41.6 %   MCV 89.7 80.0 - 100.0 fL   MCH 28.4 26.0 - 34.0 pg   MCHC 31.7 30.0 - 36.0 g/dL   RDW 60.6 30.1 - 60.1 %   Platelets 474 (H) 150 - 400 K/uL   nRBC 0.0 0.0 - 0.2 %    Comment: Performed at [facility], [address], [place], [place] [address]  Glucose, capillary     Status: Abnormal   Collection Time: 07/06/22  7:58 AM  Result Value Ref Range   Glucose-Capillary 235 (H) 70 - 99 mg/dL    Comment: Glucose reference range applies only to samples taken after fasting for at  least 8 hours.   Comment 1 Notify RN     Imaging / Studies: ECHOCARDIOGRAM COMPLETE  Result Date: 07/04/2022    ECHOCARDIOGRAM REPORT   Patient Name:   [name] Date of Exam: 07/04/2022 Medical Rec #:  [id number]     Height:       67.0 in Accession #:    [id number]    Weight:       222.0 lb Date of Birth:  1990-07-27     BSA:          2.114 m Patient Age:    32 years      BP:           122/70 mmHg Patient Gender: M             HR:           98 bpm. Exam Location:  Inpatient Procedure: 2D Echo, Cardiac Doppler and Color Doppler Indications:    Dyspnea  History:        Patient has no prior history of Echocardiogram examinations.                 Signs/Symptoms:Fever and acute respiratory failure; Risk  Factors:Diabetes and abscess, sepsis.  Sonographer:    Wallie Char Referring Phys: [id number]  Sonographer Comments: Echo performed with patient supine and on artificial respirator, patient is obese and Technically challenging study due to limited acoustic windows. Image acquisition challenging due to patient body habitus. IMPRESSIONS  1. Left ventricular ejection fraction, by estimation, is 60 to 65%. The left ventricle has normal function. The left ventricle has no regional wall motion abnormalities. Left ventricular diastolic parameters were normal.  2. Right ventricular systolic function is normal. The right ventricular size is mildly enlarged.  3. The mitral valve is normal in structure. No evidence of mitral valve regurgitation. No evidence of mitral stenosis.  4. The aortic valve is normal in structure. Aortic valve regurgitation is not visualized. No aortic stenosis is present.  5. The inferior vena cava is normal in size with greater than 50% respiratory variability, suggesting right atrial pressure of 3 mmHg. FINDINGS  Left Ventricle: Left ventricular ejection fraction, by estimation, is 60 to 65%. The left ventricle has normal function. The left ventricle has no regional wall  motion abnormalities. The left ventricular internal cavity size was normal in size. There is  no left ventricular hypertrophy. Left ventricular diastolic parameters were normal. Right Ventricle: The right ventricular size is mildly enlarged. No increase in right ventricular wall thickness. Right ventricular systolic function is normal. Left Atrium: Left atrial size was normal in size. Right Atrium: Right atrial size was normal in size. Pericardium: There is no evidence of pericardial effusion. Mitral Valve: The mitral valve is normal in structure. No evidence of mitral valve regurgitation. No evidence of mitral valve stenosis. MV peak gradient, 5.5 mmHg. The mean mitral valve gradient is 3.0 mmHg. Tricuspid Valve: The tricuspid valve is normal in structure. Tricuspid valve regurgitation is trivial. No evidence of tricuspid stenosis. Aortic Valve: The aortic valve is normal in structure. Aortic valve regurgitation is not visualized. No aortic stenosis is present. Aortic valve mean gradient measures 7.0 mmHg. Aortic valve peak gradient measures 12.0 mmHg. Aortic valve area, by VTI measures 2.72 cm. Pulmonic Valve: The pulmonic valve was normal in structure. Pulmonic valve regurgitation is trivial. No evidence of pulmonic stenosis. Aorta: The aortic root is normal in size and structure. Venous: The inferior vena cava is normal in size with greater than 50% respiratory variability, suggesting right atrial pressure of 3 mmHg. IAS/Shunts: No atrial level shunt detected by color flow Doppler.  LEFT VENTRICLE PLAX 2D LVIDd:         4.60 cm     Diastology LVIDs:         3.10 cm     LV e' medial:    9.98 cm/s LV PW:         1.00 cm     LV E/e' medial:  11.8 LV IVS:        0.90 cm     LV e' lateral:   11.50 cm/s LVOT diam:     1.90 cm     LV E/e' lateral: 10.3 LV SV:         78 LV SV Index:   37 LVOT Area:     2.84 cm  LV Volumes (MOD) LV vol d, MOD A2C: 82.3 ml LV vol d, MOD A4C: 78.4 ml LV vol s, MOD A2C: 28.0 ml LV vol s,  MOD A4C: 27.4 ml LV SV MOD A2C:     54.3 ml LV SV MOD A4C:     78.4 ml LV  SV MOD BP:      53.9 ml RIGHT VENTRICLE             IVC RV Basal diam:  4.20 cm     IVC diam: 1.80 cm RV S prime:     17.50 cm/s TAPSE (M-mode): 2.6 cm LEFT ATRIUM             Index        RIGHT ATRIUM           Index LA diam:        3.70 cm 1.75 cm/m   RA Area:     16.90 cm LA Vol (A2C):   25.2 ml 11.92 ml/m  RA Volume:   46.80 ml  22.14 ml/m LA Vol (A4C):   31.0 ml 14.66 ml/m LA Biplane Vol: 28.9 ml 13.67 ml/m  AORTIC VALVE AV Area (Vmax):    2.70 cm AV Area (Vmean):   2.40 cm AV Area (VTI):     2.72 cm AV Vmax:           173.50 cm/s AV Vmean:          126.000 cm/s AV VTI:            0.286 m AV Peak Grad:      12.0 mmHg AV Mean Grad:      7.0 mmHg LVOT Vmax:         165.50 cm/s LVOT Vmean:        106.500 cm/s LVOT VTI:          0.275 m LVOT/AV VTI ratio: 0.96  AORTA Ao Root diam: 3.10 cm Ao Asc diam:  2.80 cm MITRAL VALVE                TRICUSPID VALVE MV Area (PHT): 3.56 cm     TR Peak grad:   15.7 mmHg MV Area VTI:   3.00 cm     TR Vmax:        198.00 cm/s MV Peak grad:  5.5 mmHg MV Mean grad:  3.0 mmHg     SHUNTS MV Vmax:       1.17 m/s     Systemic VTI:  0.28 m MV Vmean:      74.4 cm/s    Systemic Diam: 1.90 cm MV Decel Time: 213 msec MV E velocity: 118.00 cm/s MV A velocity: 80.60 cm/s MV E/A ratio:  1.46 Arvilla Meres MD Electronically signed by Arvilla Meres MD Signature Date/Time: 07/04/2022/12:21:01 PM    Final     Medications / Allergies: per chart  Antibiotics: Anti-infectives (From admission, onward)    Start     Dose/Rate Route Frequency Ordered Stop   07/02/22 1545  metroNIDAZOLE (FLAGYL) IVPB 500 mg        500 mg 100 mL/hr over 60 Minutes Intravenous Every 12 hours 07/02/22 1452     07/02/22 1530  amoxicillin (AMOXIL) capsule 250 mg  Status:  Discontinued        250 mg Oral  Once 07/02/22 1430 07/02/22 1452   07/02/22 1530  metroNIDAZOLE (FLAGYL) tablet 500 mg  Status:  Discontinued        500 mg  Oral Every 12 hours 07/02/22 1436 07/02/22 1452   07/02/22 1433  cefTRIAXone (ROCEPHIN) 2 g in sodium chloride 0.9 % 100 mL IVPB        2 g 200 mL/hr over 30 Minutes Intravenous Every 24 hours 07/02/22 1434     07/02/22 1430  metroNIDAZOLE (  FLAGYL) IVPB 500 mg  Status:  Discontinued        500 mg 100 mL/hr over 60 Minutes Intravenous Every 12 hours 07/02/22 1400 07/02/22 1436   07/02/22 1200  amoxicillin (AMOXIL) capsule 500 mg  Status:  Discontinued        500 mg Oral  Once 07/02/22 1018 07/02/22 1430   07/02/22 1115  cefTRIAXone (ROCEPHIN) 2 g in sodium chloride 0.9 % 100 mL IVPB  Status:  Discontinued        2 g 200 mL/hr over 30 Minutes Intravenous Every 24 hours 07/02/22 1018 07/02/22 1434   07/01/22 2000  vancomycin (VANCOREADY) IVPB 1750 mg/350 mL  Status:  Discontinued        1,750 mg 175 mL/hr over 120 Minutes Intravenous Every 36 hours 06/30/22 1801 07/02/22 1018   07/01/22 0000  ceFEPIme (MAXIPIME) 2 g in sodium chloride 0.9 % 100 mL IVPB  Status:  Discontinued        2 g 200 mL/hr over 30 Minutes Intravenous Every 12 hours 06/30/22 1801 07/02/22 1018   06/30/22 2100  vancomycin (VANCOREADY) IVPB 750 mg/150 mL  Status:  Discontinued        750 mg 150 mL/hr over 60 Minutes Intravenous Every 12 hours 06/30/22 0718 06/30/22 1801   06/30/22 0800  vancomycin (VANCOREADY) IVPB 2000 mg/400 mL        2,000 mg 200 mL/hr over 120 Minutes Intravenous NOW 06/30/22 0705 06/30/22 1222   06/30/22 0800  ceFEPIme (MAXIPIME) 2 g in sodium chloride 0.9 % 100 mL IVPB  Status:  Discontinued        2 g 200 mL/hr over 30 Minutes Intravenous Every 8 hours 06/30/22 0708 06/30/22 1801   06/29/22 1800  cefTRIAXone (ROCEPHIN) 2 g in sodium chloride 0.9 % 100 mL IVPB        2 g 200 mL/hr over 30 Minutes Intravenous  Once 06/29/22 1747 06/29/22 1910         Note: Portions of this report may have been transcribed using voice recognition software. Every effort was made to ensure accuracy; however,  inadvertent computerized transcription errors may be present.   Any transcriptional errors that result from this process are unintentional.    Ardeth Sportsman, MD, FACS, MASCRS Esophageal, Gastrointestinal & Colorectal Surgery Robotic and Minimally Invasive Surgery  [facility] [address] [place], [place] [address] [phone] Fax [phone] Main  CONTACT INFORMATION:  Weekday (9AM-5PM): Call [facility] main office at [phone]  Weeknight (5PM-9AM) or Weekend/Holiday: Check [email or web address] (password " [email or web address]") for General Surgery [facility] coverage  (Please, do not use SecureChat as it is not reliable communication to reach operating surgeons for immediate patient care given surgeries/outpatient duties/clinic/cross-coverage/off post-call which would lead to a delay in care.  Epic staff messaging available for outptient concerns, but may not be answered for 48 hours or more).     07/06/2022  10:05 AM

## 2022-07-06 NOTE — Progress Notes (Signed)
2 Days Post-Op Subjective: Patient reports mild perineal pain. Foley draining clear Urine. Patient ambulated yesterday with PT  Objective: Vital signs in last 24 hours: Temp:  [98.1 F (36.7 C)-98.6 F (37 C)] 98.6 F (37 C) (06/09 0617) Pulse Rate:  [90-120] 110 (06/09 0852) Resp:  [13-25] 20 (06/09 0852) BP: (121-143)/(54-76) 121/64 (06/09 0852) SpO2:  [92 %-99 %] 95 % (06/09 0852)  Intake/Output from previous day: 06/08 0701 - 06/09 0700 In: 1573.6 [P.O.:650; I.V.:546.4; IV Piggyback:347.1] Out: 2300 [Urine:2175; Stool:125] Intake/Output this shift: No intake/output data recorded.  Physical Exam:  General:alert, cooperative, and appears stated age GI: soft, non tender, normal bowel sounds, no palpable masses, no organomegaly, no inguinal hernia Male genitalia: not done Scrotum: erythema: bilateral Extremities: extremities normal, atraumatic, no cyanosis or edema  Lab Results: Recent Labs    07/04/22 0401 07/05/22 0424 07/06/22 0402  HGB 8.9* 9.2* 9.7*  HCT 28.0* 29.3* 30.6*   BMET Recent Labs    07/05/22 0424 07/06/22 0402  NA 140 139  K 3.4* 4.2  CL 107 105  CO2 24 23  GLUCOSE 118* 205*  BUN 13 11  CREATININE 0.94 1.11  CALCIUM 7.2* 8.0*   No results for input(s): "LABPT", "INR" in the last 72 hours. No results for input(s): "LABURIN" in the last 72 hours. Results for orders placed or performed during the hospital encounter of 06/29/22  Blood Culture (routine x 2)     Status: None   Collection Time: 06/29/22  3:46 PM   Specimen: BLOOD RIGHT HAND  Result Value Ref Range Status   Specimen Description   Final    BLOOD RIGHT HAND BOTTLES DRAWN AEROBIC AND ANAEROBIC   Special Requests Blood Culture adequate volume  Final   Culture   Final    NO GROWTH 5 DAYS Performed at [facility], [address] [place], [place] [address]    Report Status 07/04/2022 FINAL  Final  Blood Culture (routine x 2)     Status: None   Collection Time: 06/29/22  3:55 PM    Specimen: Right Antecubital; Blood  Result Value Ref Range Status   Specimen Description   Final    RIGHT ANTECUBITAL BOTTLES DRAWN AEROBIC AND ANAEROBIC   Special Requests   Final    Blood Culture results may not be optimal due to an excessive volume of blood received in culture bottles   Culture   Final    NO GROWTH 5 DAYS Performed at [facility], [address], [place], [place] [address]    Report Status 07/04/2022 FINAL  Final  Aerobic Culture w Gram Stain (superficial specimen)     Status: None   Collection Time: 06/29/22  5:00 PM   Specimen: Abscess  Result Value Ref Range Status   Specimen Description   Final    ABSCESS Performed at [facility], [address] [place], [place] [address]    Special Requests   Final    NONE Performed at [facility], [address] [place], [place] [address]    Gram Stain   Final    RARE WBC PRESENT, PREDOMINANTLY PMN NO ORGANISMS SEEN    Culture   Final    RARE NORMAL SKIN FLORA Performed at [facility], [address], [place], [place] [address]    Report Status 07/02/2022 FINAL  Final  MRSA Next Gen by PCR, Nasal     Status: None   Collection Time: 06/30/22  8:37 AM   Specimen: Nasal Mucosa;  Nasal Swab  Result Value Ref Range Status   MRSA by PCR Next Gen NOT DETECTED NOT DETECTED Final    Comment: (NOTE) The GeneXpert MRSA Assay (FDA approved for NASAL specimens only), is one component of a comprehensive MRSA colonization surveillance program. It is not intended to diagnose MRSA infection nor to guide or monitor treatment for MRSA infections. Test performance is not FDA approved in patients less than 32 years old. Performed at [facility], [address], [place], [place] [address]   Culture, blood (Routine X 2) w Reflex to ID Panel     Status: None   Collection Time: 06/30/22  9:08 AM   Specimen: BLOOD  Result Value Ref Range Status   Specimen Description   Final    BLOOD BLOOD LEFT  HAND Performed at [facility], [address] [place], [place] [address]    Special Requests   Final    BOTTLES DRAWN AEROBIC ONLY Blood Culture adequate volume Performed at [facility], [address], [place], [place] [address]    Culture   Final    NO GROWTH 5 DAYS Performed at [facility], [address], [place], [place] [address]    Report Status 07/05/2022 FINAL  Final  Culture, blood (Routine X 2) w Reflex to ID Panel     Status: None   Collection Time: 06/30/22 10:05 AM   Specimen: BLOOD RIGHT HAND  Result Value Ref Range Status   Specimen Description   Final    BLOOD RIGHT HAND Performed at [facility], [address], [place], [place] [address]    Special Requests   Final    AEROBIC BOTTLE ONLY Blood Culture adequate volume Performed at [facility], [address], [place], [place] [address]    Culture   Final    NO GROWTH 5 DAYS Performed at [facility], [address] [place], [place] [address]    Report Status 07/05/2022 FINAL  Final  Aerobic/Anaerobic Culture w Gram Stain (surgical/deep wound)     Status: None (Preliminary result)   Collection Time: 06/30/22  1:39 PM   Specimen: Path fluid; Abscess  Result Value Ref Range Status   Specimen Description ABSCESS PERIRECTAL  Final   Special Requests NONE  Final   Gram Stain   Final    NO WBC SEEN MODERATE GRAM POSITIVE COCCI MODERATE GRAM NEGATIVE RODS RARE GRAM POSITIVE RODS    Culture   Final    RARE GROUP B STREP(S.AGALACTIAE)ISOLATED TESTING AGAINST S. AGALACTIAE NOT ROUTINELY PERFORMED DUE TO PREDICTABILITY OF AMP/PEN/VAN SUSCEPTIBILITY. FEW PEPTOSTREPTOCOCCUS ANAEROBIUS Standardized susceptibility testing for this organism is not available. CULTURE REINCUBATED FOR BETTER GROWTH    Report Status PENDING  Incomplete  Culture, Respiratory w Gram Stain     Status: None (Preliminary result)   Collection Time:  07/03/22 10:36 AM   Specimen: Tracheal Aspirate; Respiratory  Result Value Ref Range Status   Specimen Description   Final    TRACHEAL ASPIRATE Performed at [facility], [address], [place], [place] [address]    Special Requests   Final    NONE Performed at [facility], [address], [place], [place] [address]    Gram Stain   Final    ABUNDANT WBC PRESENT,BOTH PMN AND MONONUCLEAR NO ORGANISMS SEEN    Culture   Final    NO GROWTH 2 DAYS Performed at [facility], [address], [place], [place] [address]    Report Status PENDING  Incomplete    Studies/Results: ECHOCARDIOGRAM COMPLETE  Result Date: 07/04/2022    ECHOCARDIOGRAM REPORT   Patient Name:   [name] Date of Exam: 07/04/2022 Medical Rec #:  [id number]     Height:       67.0 in Accession #:    [id number]    Weight:       222.0 lb Date of Birth:  1990/10/11     BSA:          2.114 m Patient Age:    32 years      BP:           122/70 mmHg Patient Gender: M             HR:           98 bpm. Exam Location:  Inpatient Procedure: 2D Echo, Cardiac Doppler and Color Doppler Indications:    Dyspnea  History:        Patient has no prior history of Echocardiogram examinations.                 Signs/Symptoms:Fever and acute respiratory failure; Risk                 Factors:Diabetes and abscess, sepsis.  Sonographer:    Wallie Char Referring Phys: [id number]  Sonographer Comments: Echo performed with patient supine and on artificial respirator, patient is obese and Technically challenging study due to limited acoustic windows. Image acquisition challenging due to patient body habitus. IMPRESSIONS  1. Left ventricular ejection fraction, by estimation, is 60 to 65%. The left ventricle has normal function. The left ventricle has no regional wall motion abnormalities. Left ventricular diastolic parameters were normal.  2. Right ventricular systolic function is normal. The right  ventricular size is mildly enlarged.  3. The mitral valve is normal in structure. No evidence of mitral valve regurgitation. No evidence of mitral stenosis.  4. The aortic valve is normal in structure. Aortic valve regurgitation is not visualized. No aortic stenosis is present.  5. The inferior vena cava is normal in size with greater than 50% respiratory variability, suggesting right atrial pressure of 3 mmHg. FINDINGS  Left Ventricle: Left ventricular ejection fraction, by estimation, is 60 to 65%. The left ventricle has normal function. The left ventricle has no regional wall motion abnormalities. The left ventricular internal cavity size was normal in size. There is  no left ventricular hypertrophy. Left ventricular diastolic parameters were normal. Right Ventricle: The right ventricular size is mildly enlarged. No increase in right ventricular wall thickness. Right ventricular systolic function is normal. Left Atrium: Left atrial size was normal in size. Right Atrium: Right atrial size was normal in size. Pericardium: There is no evidence of pericardial effusion. Mitral Valve: The mitral valve is normal in structure. No evidence of mitral valve regurgitation. No evidence of mitral valve stenosis. MV peak gradient, 5.5 mmHg. The mean mitral valve gradient is 3.0 mmHg. Tricuspid Valve: The tricuspid valve is normal in structure. Tricuspid valve regurgitation is trivial. No evidence of tricuspid stenosis. Aortic Valve: The aortic valve is normal in structure. Aortic valve regurgitation is not visualized. No aortic stenosis is present. Aortic valve mean gradient measures 7.0 mmHg. Aortic valve peak gradient measures 12.0 mmHg. Aortic valve area, by VTI measures 2.72 cm. Pulmonic Valve: The pulmonic valve was normal in structure. Pulmonic valve regurgitation is trivial. No evidence of  pulmonic stenosis. Aorta: The aortic root is normal in size and structure. Venous: The inferior vena cava is normal in size with  greater than 50% respiratory variability, suggesting right atrial pressure of 3 mmHg. IAS/Shunts: No atrial level shunt detected by color flow Doppler.  LEFT VENTRICLE PLAX 2D LVIDd:         4.60 cm     Diastology LVIDs:         3.10 cm     LV e' medial:    9.98 cm/s LV PW:         1.00 cm     LV E/e' medial:  11.8 LV IVS:        0.90 cm     LV e' lateral:   11.50 cm/s LVOT diam:     1.90 cm     LV E/e' lateral: 10.3 LV SV:         78 LV SV Index:   37 LVOT Area:     2.84 cm  LV Volumes (MOD) LV vol d, MOD A2C: 82.3 ml LV vol d, MOD A4C: 78.4 ml LV vol s, MOD A2C: 28.0 ml LV vol s, MOD A4C: 27.4 ml LV SV MOD A2C:     54.3 ml LV SV MOD A4C:     78.4 ml LV SV MOD BP:      53.9 ml RIGHT VENTRICLE             IVC RV Basal diam:  4.20 cm     IVC diam: 1.80 cm RV S prime:     17.50 cm/s TAPSE (M-mode): 2.6 cm LEFT ATRIUM             Index        RIGHT ATRIUM           Index LA diam:        3.70 cm 1.75 cm/m   RA Area:     16.90 cm LA Vol (A2C):   25.2 ml 11.92 ml/m  RA Volume:   46.80 ml  22.14 ml/m LA Vol (A4C):   31.0 ml 14.66 ml/m LA Biplane Vol: 28.9 ml 13.67 ml/m  AORTIC VALVE AV Area (Vmax):    2.70 cm AV Area (Vmean):   2.40 cm AV Area (VTI):     2.72 cm AV Vmax:           173.50 cm/s AV Vmean:          126.000 cm/s AV VTI:            0.286 m AV Peak Grad:      12.0 mmHg AV Mean Grad:      7.0 mmHg LVOT Vmax:         165.50 cm/s LVOT Vmean:        106.500 cm/s LVOT VTI:          0.275 m LVOT/AV VTI ratio: 0.96  AORTA Ao Root diam: 3.10 cm Ao Asc diam:  2.80 cm MITRAL VALVE                TRICUSPID VALVE MV Area (PHT): 3.56 cm     TR Peak grad:   15.7 mmHg MV Area VTI:   3.00 cm     TR Vmax:        198.00 cm/s MV Peak grad:  5.5 mmHg MV Mean grad:  3.0 mmHg     SHUNTS MV Vmax:       1.17 m/s  Systemic VTI:  0.28 m MV Vmean:      74.4 cm/s    Systemic Diam: 1.90 cm MV Decel Time: 213 msec MV E velocity: 118.00 cm/s MV A velocity: 80.60 cm/s MV E/A ratio:  1.46 Arvilla Meres MD Electronically signed  by Arvilla Meres MD Signature Date/Time: 07/04/2022/12:21:01 PM    Final     Assessment/Plan: 32yo with Forniers gangrane 1. Foley can be removed when patient is ambulatory   LOS: 6 days   Wilkie Aye 07/06/2022, 10:38 AM

## 2022-07-07 DIAGNOSIS — N493 Fournier gangrene: Secondary | ICD-10-CM | POA: Diagnosis not present

## 2022-07-07 LAB — GLUCOSE, CAPILLARY
Glucose-Capillary: 219 mg/dL — ABNORMAL HIGH (ref 70–99)
Glucose-Capillary: 211 mg/dL — ABNORMAL HIGH (ref 70–99)
Glucose-Capillary: 178 mg/dL — ABNORMAL HIGH (ref 70–99)
Glucose-Capillary: 191 mg/dL — ABNORMAL HIGH (ref 70–99)

## 2022-07-07 LAB — BASIC METABOLIC PANEL
Anion gap: 11 (ref 5–15)
BUN: 10 mg/dL (ref 6–20)
CO2: 27 mmol/L (ref 22–32)
Calcium: 8.2 mg/dL — ABNORMAL LOW (ref 8.9–10.3)
Chloride: 101 mmol/L (ref 98–111)
Creatinine, Ser: 1.08 mg/dL (ref 0.61–1.24)
GFR, Estimated: >60 mL/min (ref >60)
Glucose, Bld: 211 mg/dL — ABNORMAL HIGH (ref 70–99)
Potassium: 4.1 mmol/L (ref 3.5–5.1)
Sodium: 139 mmol/L (ref 135–145)

## 2022-07-07 LAB — CBC
HCT: 30.2 % — ABNORMAL LOW (ref 39.0–52.0)
Hemoglobin: 9.5 g/dL — ABNORMAL LOW (ref 13.0–17.0)
MCH: 28.1 pg (ref 26.0–34.0)
MCHC: 31.5 g/dL (ref 30.0–36.0)
MCV: 89.3 fL (ref 80.0–100.0)
Platelets: 485 10*3/uL — ABNORMAL HIGH (ref 150–400)
RBC: 3.38 MIL/uL — ABNORMAL LOW (ref 4.22–5.81)
RDW: 14.9 % (ref 11.5–15.5)
WBC: 14.7 10*3/uL — ABNORMAL HIGH (ref 4.0–10.5)
nRBC: 0 % (ref 0.0–0.2)

## 2022-07-07 LAB — PHOSPHORUS: Phosphorus: 2.7 mg/dL (ref 2.5–4.6)

## 2022-07-07 LAB — MAGNESIUM: Magnesium: 1.5 mg/dL — ABNORMAL LOW (ref 1.7–2.4)

## 2022-07-07 MED ORDER — EPINEPHRINE 0.3 MG/0.3ML IJ SOAJ
0.3000 mg | Freq: Once | INTRAMUSCULAR | Status: DC | PRN
  Filled 2022-07-07: qty 0.3

## 2022-07-07 MED ORDER — ENSURE MAX PROTEIN PO LIQD
11.0000 [oz_av] | Freq: Two times a day (BID) | ORAL | Status: DC
  Administered 2022-07-08 – 2022-07-10 (×4): 11 [oz_av] via ORAL
  Filled 2022-07-07 (×7): qty 330

## 2022-07-07 MED ORDER — AMOXICILLIN 250 MG PO CAPS
250.0000 mg | ORAL_CAPSULE | Freq: Once | ORAL | Status: AC
  Administered 2022-07-07: 250 mg via ORAL
  Filled 2022-07-07: qty 1

## 2022-07-07 MED ORDER — DIPHENHYDRAMINE HCL 50 MG/ML IJ SOLN
25.0000 mg | Freq: Once | INTRAMUSCULAR | Status: DC | PRN
  Filled 2022-07-07: qty 1

## 2022-07-07 MED ORDER — CLONAZEPAM 0.5 MG PO TABS
0.5000 mg | ORAL_TABLET | Freq: Every day | ORAL | Status: DC
  Administered 2022-07-08 – 2022-07-10 (×3): 0.5 mg via ORAL
  Filled 2022-07-07 (×3): qty 1

## 2022-07-07 NOTE — Progress Notes (Addendum)
PT HYDROTHERAPY EVAL AND TX  07/07/22 1300  Subjective Assessment  Subjective agreeable to wound care   Date of onset 0603/24 Hospital course/PMH: 32-yo male admitted evolving abscess on his buttock and fevers as high as 103, Urgent care referred pt to [facility] ED where he was further worked up, including an unsuccessful I&D attempt, he was sent to [facility] for admission and CT scan. CT scan did not describe any soft tissue changes. Surgery was consulted. found to have necrotizing infection and underwent I&D x2. Postoperatively he was extubated, but quickly desaturated and was reintubated. He was transferred to the intensive care unit where PCCM ws consulted. PMH: poorly controlled diabetes and obesity    Prior Treatments s/p surgical I&D x 2  Evaluation and Treatment  Evaluation and Treatment Procedures Explained to Patient/Family Yes  Evaluation and Treatment Procedures agreed to  Wound / Incision (Open or Dehisced) 07/07/22 Incision - Open Groin Left **PT ONLY** open wound after repeated surgical  I&D x2  Date First Assessed: 07/07/22   Wound Type: Incision - Open  Location: (c) Groin  Location Orientation: Left  Wound Description (Comments): **PT ONLY** open wound after repeated surgical  I&D x2  Present on Admission: No  Dressing Type Gauze (Comment);ABD;Moist to dry;Mesh briefs  Dressing Changed Changed  Dressing Change Frequency Twice a day  Site / Wound Assessment Granulation tissue;Painful;Red;Yellow  % Wound base Red or Granulating 15%  % Wound base Yellow/Fibrinous Exudate 85%  Peri-wound Assessment Erythema (blanchable);Edema;Intact  Wound Length (cm) 23 cm  Wound Width (cm) 9 cm  Wound Depth (cm) 5.5 cm  Wound Volume (cm^3) 1138.5 cm^3  Wound Surface Area (cm^2) 207 cm^2  Margins Unattached edges (unapproximated)  Closure None  Drainage Amount Moderate  Drainage Description No odor;Serosanguineous  Treatment Hydrotherapy (Pulse lavage);Debridement (Selective);Packing  (Saline gauze)  Hydrotherapy  Pulsed Lavage with Suction (psi) 8 psi  Pulsed Lavage with Suction - Normal Saline Used 1000 mL  Pulsed Lavage Tip Tip with splash shield  Pulsed lavage therapy - wound location perineum  Selective Debridement (non-excisional)  Selective Debridement (non-excisional) - Location scrotum, perineum extending to L gluteal area  Selective Debridement (non-excisional) - Tools Used Forceps;Scissors  Selective Debridement (non-excisional) - Tissue Removed yellow and light brown slough; necrosign adipose tissue  Wound Therapy - Assess/Plan/Recommendations  Wound Therapy - Clinical Statement pt will benefit from hydrotherapy to facilitate wound healing,  decr bioburden  Wound Therapy - Functional Problem List painful mobility  Factors Delaying/Impairing Wound Healing Diabetes Mellitus;Other (comment) (obesity)  Hydrotherapy Plan Debridement;Dressing change;Patient/family education;Pulsatile lavage with suction  Wound Therapy - Frequency 3X / week  Wound Therapy - Follow Up Recommendations dressing changes by family/patient;dressing changes by RN  Wound Therapy Goals - Improve the function of patient's integumentary system by progressing the wound(s) through the phases of wound healing by:  Decrease Necrotic Tissue to 40  Decrease Necrotic Tissue - Progress Goal set today  Increase Granulation Tissue to 60  Increase Granulation Tissue - Progress Goal set today  Decrease Length/Width/Depth by (cm) .2/.2/.3  Decrease Length/Width/Depth - Progress Goal set today  Improve Drainage Characteristics Min  Improve Drainage Characteristics - Progress Goal set today  Patient/Family will be able to  verbalize dressing changes  Patient/Family Instruction Goal - Progress Goal set today   Delice Bison, PT  Acute Rehab Dept [facility]) [phone]  07/07/2022

## 2022-07-07 NOTE — Inpatient Diabetes Management (Signed)
Inpatient Diabetes Program Recommendations  AACE/ADA: New Consensus Statement on Inpatient Glycemic Control (2015)  Target Ranges:  Prepandial:   less than 140 mg/dL      Peak postprandial:   less than 180 mg/dL (1-2 hours)      Critically ill patients:  140 - 180 mg/dL   Lab Results  Component Value Date   GLUCAP 211 (H) 07/07/2022   HGBA1C 12.3 (H) 06/30/2022    Review of Glycemic Control  Diabetes history: DM1 Outpatient Diabetes medications: Toujeo 60 QHS, Humalog 10-20 untis QID Current orders for Inpatient glycemic control: Levemir 18 BID, Novolog 0-20 TID with meals and 0-5 HS  CBGs: 235, 272, 265, 254 Need tight glycemic control for healing  Inpatient Diabetes Program Recommendations:    Increase Levemir to 20 units BID  Add meal coverage - 6 units TID with meals if eating > 50%  Continue to follow trends.  Thank you. Ailene Ards, RD, LDN, CDCES Inpatient Diabetes Coordinator [phone]

## 2022-07-07 NOTE — Progress Notes (Signed)
Amoxicillin given with no complications. No rash or changes noted. VSS throughout trial. PRN Benadryl and Epi pen remains at bedside for future use if necessary.

## 2022-07-07 NOTE — Progress Notes (Signed)
OT Cancellation Note  Patient Details Name: [name] MRN: [id number] DOB: [birth year]   Cancelled Treatment:    Reason Eval/Treat Not Completed: OT screened, no needs identified, will sign off Patient reporting taking shower himself while here in the hospital. Patient and wife in room endorsing that patient does not need skilled OT services at this time. OT to sign off.  Rosalio Loud, MS Acute Rehabilitation Department Office# [phone]  07/07/2022, 3:39 PM

## 2022-07-07 NOTE — Progress Notes (Signed)
Mobility Specialist - Progress Note   07/07/22 1551  Mobility  Activity Ambulated independently in hallway  Level of Assistance Independent after set-up  Assistive Device None  Distance Ambulated (ft) 350 ft  Range of Motion/Exercises Active  Activity Response Tolerated well  Mobility Referral Yes  $Mobility charge 1 Mobility  Mobility Specialist Start Time (ACUTE ONLY) 1540  Mobility Specialist Stop Time (ACUTE ONLY) 1550  Mobility Specialist Time Calculation (min) (ACUTE ONLY) 10 min   Pt was found in bed and agreeable to ambulate per RN. Had no complaints during session and at EOS returned to bed with all needs met. Wife in room and call bell in reach.  Billey Chang Mobility Specialist

## 2022-07-07 NOTE — Progress Notes (Signed)
3 Days Post-Op   Subjective/Chief Complaint: Getting cleaned up at the sink.  Just got out of the shower   Objective: Vital signs in last 24 hours: Temp:  [98.2 F (36.8 C)-99.5 F (37.5 C)] 98.2 F (36.8 C) (06/10 0432) Pulse Rate:  [104-121] 104 (06/10 0432) Resp:  [14-22] 22 (06/10 0432) BP: (112-134)/(56-72) 116/61 (06/10 0432) SpO2:  [93 %-97 %] 96 % (06/10 0432) Last BM Date : 07/05/22  Intake/Output from previous day: 06/09 0701 - 06/10 0700 In: 507.7 [P.O.:120; I.V.:138.8; IV Piggyback:248.8] Out: 2000 [Urine:2000] Intake/Output this shift: No intake/output data recorded.  GU: dressing removed.  No further purulent drainage or infection noted, but has some fibrinous tissue in the perineum and in the scrotal wound.  Lab Results:  Recent Labs    07/06/22 0402 07/07/22 0305  WBC 15.4* 14.7*  HGB 9.7* 9.5*  HCT 30.6* 30.2*  PLT 474* 485*   BMET Recent Labs    07/06/22 0402 07/07/22 0305  NA 139 139  K 4.2 4.1  CL 105 101  CO2 23 27  GLUCOSE 205* 211*  BUN 11 10  CREATININE 1.11 1.08  CALCIUM 8.0* 8.2*   PT/INR No results for input(s): "LABPROT", "INR" in the last 72 hours. ABG Recent Labs    07/04/22 1125  HCO3 23.7    Studies/Results: No results found.  Anti-infectives: Anti-infectives (From admission, onward)    Start     Dose/Rate Route Frequency Ordered Stop   07/02/22 1545  metroNIDAZOLE (FLAGYL) IVPB 500 mg        500 mg 100 mL/hr over 60 Minutes Intravenous Every 12 hours 07/02/22 1452 07/10/22 1759   07/02/22 1530  amoxicillin (AMOXIL) capsule 250 mg  Status:  Discontinued        250 mg Oral  Once 07/02/22 1430 07/02/22 1452   07/02/22 1530  metroNIDAZOLE (FLAGYL) tablet 500 mg  Status:  Discontinued        500 mg Oral Every 12 hours 07/02/22 1436 07/02/22 1452   07/02/22 1433  cefTRIAXone (ROCEPHIN) 2 g in sodium chloride 0.9 % 100 mL IVPB        2 g 200 mL/hr over 30 Minutes Intravenous Every 24 hours 07/02/22 1434 07/10/22  1049   07/02/22 1430  metroNIDAZOLE (FLAGYL) IVPB 500 mg  Status:  Discontinued        500 mg 100 mL/hr over 60 Minutes Intravenous Every 12 hours 07/02/22 1400 07/02/22 1436   07/02/22 1200  amoxicillin (AMOXIL) capsule 500 mg  Status:  Discontinued        500 mg Oral  Once 07/02/22 1018 07/02/22 1430   07/02/22 1115  cefTRIAXone (ROCEPHIN) 2 g in sodium chloride 0.9 % 100 mL IVPB  Status:  Discontinued        2 g 200 mL/hr over 30 Minutes Intravenous Every 24 hours 07/02/22 1018 07/02/22 1434   07/01/22 2000  vancomycin (VANCOREADY) IVPB 1750 mg/350 mL  Status:  Discontinued        1,750 mg 175 mL/hr over 120 Minutes Intravenous Every 36 hours 06/30/22 1801 07/02/22 1018   07/01/22 0000  ceFEPIme (MAXIPIME) 2 g in sodium chloride 0.9 % 100 mL IVPB  Status:  Discontinued        2 g 200 mL/hr over 30 Minutes Intravenous Every 12 hours 06/30/22 1801 07/02/22 1018   06/30/22 2100  vancomycin (VANCOREADY) IVPB 750 mg/150 mL  Status:  Discontinued        750 mg 150 mL/hr over  60 Minutes Intravenous Every 12 hours 06/30/22 0718 06/30/22 1801   06/30/22 0800  vancomycin (VANCOREADY) IVPB 2000 mg/400 mL        2,000 mg 200 mL/hr over 120 Minutes Intravenous NOW 06/30/22 0705 06/30/22 1222   06/30/22 0800  ceFEPIme (MAXIPIME) 2 g in sodium chloride 0.9 % 100 mL IVPB  Status:  Discontinued        2 g 200 mL/hr over 30 Minutes Intravenous Every 8 hours 06/30/22 0708 06/30/22 1801   06/29/22 1800  cefTRIAXone (ROCEPHIN) 2 g in sodium chloride 0.9 % 100 mL IVPB        2 g 200 mL/hr over 30 Minutes Intravenous  Once 06/29/22 1747 06/29/22 1910       Assessment/Plan: POD 5, s/p I&D of perirectal abscess, POD 3, s/pdebridement NSTI, Dr. Dwain Sarna -WBC decreasing -cont abx therapy -cx with Group B strep, ID has seen him -cont BID WD dressing changes - some fibrin in wound.  Will add hydrotherapy today to start working on getting this better prior to DC home -no further infection noted at this  time to require return to OR   FEN -  CM diet, just stool softener, hold miralax to minimize diarrhea VTE - Lovenox ID - maxipime, vanc per ID   Type I diabetes  [name] 07/07/2022

## 2022-07-07 NOTE — Progress Notes (Signed)
[facility] Infectious Diseases Follow Up Note  Patient Identification: Patient Name: [name] MRN: [id number] Admit Date: 06/29/2022  3:27 PM Age: 32 y.o.Today's Date: 07/07/2022  Reason for Visit: Fournier's gangrene  Principal Problem:   Fournier's gangrene in male Active Problems:   Sepsis (HCC)   AKI (acute kidney injury) (HCC)   Type 2 diabetes mellitus with hyperglycemia, with long-term current use of insulin (HCC)   Acute respiratory failure with hypoxia (HCC)   Current moderate episode of major depressive disorder without prior episode (HCC)   Obesity (BMI 35.0-39.9 without comorbidity)   Poor control type I diabetes mellitus (HCC)   Current Antibiotics:  Ceftriaxone 6/5-c Metronidazole 6/5-c Total days of antibiotics 9  Lines/Hardwares: Rt arm picc  Interval Events: Leukocytosis downtrending  Remains afebrile.  Assessment 22 Y O obese male with h/o type 1 DM admitted with   left perirectal abscess/NSTI perineum 6/2 annie pen swab cx normal flora.  6/3 blood cx NGTD 6/3 s/p I and D. OR cx group B strep, peptostreptococcus anaerobius, prevotella buccae, beta lactamase 6/3 blood cx NGTD 6/5 repeat I and D with Urology 6/7 repeat debridement. Wound noted to be pretty clean and healthy 6/10 no plans for further debridement per surgery  # Penicillin allergy  - reported as mild rash as a young child ith no other symptoms like face/lip swelling and agreeable to PO amoxicillin challenge  Recommendations Continue IV ceftriaxone and metronidazole  PO amoxicillin 250 mg po challenge and if tolerates OK, switch to PO augmentin starting tomorrow. EOT 6/17 Post op care per surgery  ID available as needed, please  call with questions   Rest of the management as per the primary team. Thank you for the consult. Please page with pertinent questions or  concerns.  ______________________________________________________________________ Subjective patient seen and examined at the bedside.  No complaints   Vitals BP 135/85 (BP Location: Left Arm)   Pulse 93   Temp 98.2 F (36.8 C) (Oral)   Resp 18   Ht 5\' 7"  (1.702 m)   Wt 100.7 kg   SpO2 93%   BMI 34.77 kg/m     Physical Exam Constitutional:  adult male lying in the bed and appears comfortable     Comments:   Cardiovascular:     Rate and Rhythm: Normal rate and regular rhythm.     Heart sounds: s1s2  Pulmonary:     Effort: Pulmonary effort is normal.     Comments: Normal lung sounds   Abdominal:     Palpations: Abdomen is soft.     Tenderness: non distended and non tender   Musculoskeletal:        General: No swelling or tenderness in peripheral joints  Skin:    Comments:no rashes, perineal wound bandaged   Neurological:     General: awake, alert and oriented, following commands   Psychiatric:        Mood and Affect: Mood normal.   Pertinent Microbiology Results for orders placed or performed during the hospital encounter of 06/29/22  Blood Culture (routine x 2)     Status: None   Collection Time: 06/29/22  3:46 PM   Specimen: BLOOD RIGHT HAND  Result Value Ref Range Status   Specimen Description   Final    BLOOD RIGHT HAND BOTTLES DRAWN AEROBIC AND ANAEROBIC   Special Requests Blood Culture adequate volume  Final   Culture   Final    NO GROWTH 5 DAYS Performed at [facility], [address] [place], [place] [address]    Report Status 07/04/2022 FINAL  Final  Blood Culture (routine x 2)     Status: None   Collection Time: 06/29/22  3:55 PM   Specimen: Right Antecubital; Blood  Result Value Ref Range Status   Specimen Description   Final    RIGHT ANTECUBITAL BOTTLES DRAWN AEROBIC AND ANAEROBIC   Special Requests   Final    Blood Culture results may not be optimal due to an excessive volume of blood received in culture bottles   Culture   Final    NO  GROWTH 5 DAYS Performed at [facility], [address], [place], [place] [address]    Report Status 07/04/2022 FINAL  Final  Aerobic Culture w Gram Stain (superficial specimen)     Status: None   Collection Time: 06/29/22  5:00 PM   Specimen: Abscess  Result Value Ref Range Status   Specimen Description   Final    ABSCESS Performed at [facility], [address], [place], [place] [address]    Special Requests   Final    NONE Performed at [facility], [address] [place], [place] [address]    Gram Stain   Final    RARE WBC PRESENT, PREDOMINANTLY PMN NO ORGANISMS SEEN    Culture   Final    RARE NORMAL SKIN FLORA Performed at [facility], [address] [place], [place] [address]    Report Status 07/02/2022 FINAL  Final  MRSA Next Gen by PCR, Nasal     Status: None   Collection Time: 06/30/22  8:37 AM   Specimen: Nasal Mucosa; Nasal Swab  Result Value Ref Range Status   MRSA by PCR Next Gen NOT DETECTED NOT DETECTED Final    Comment: (NOTE) The GeneXpert MRSA Assay (FDA approved for NASAL specimens only), is one component of a comprehensive MRSA colonization surveillance program. It is not intended to diagnose MRSA infection nor to guide or monitor treatment for MRSA infections. Test performance is not FDA approved in patients less than 20 years old. Performed at [facility], [address], [place], [place] [address]   Culture, blood (Routine X 2) w Reflex to ID Panel     Status: None   Collection Time: 06/30/22  9:08 AM   Specimen: BLOOD  Result Value Ref Range Status   Specimen Description   Final    BLOOD BLOOD LEFT HAND Performed at [facility], [address] [place], [place] [address]    Special Requests   Final    BOTTLES DRAWN AEROBIC ONLY Blood Culture adequate volume Performed at [facility], [address], [place], [place] [address]    Culture   Final    NO GROWTH 5 DAYS Performed  at [facility], [address], [place], [place] [address]    Report Status 07/05/2022 FINAL  Final  Culture, blood (Routine X 2) w Reflex to ID Panel     Status: None   Collection Time: 06/30/22 10:05 AM   Specimen: BLOOD RIGHT HAND  Result Value Ref Range Status   Specimen Description   Final    BLOOD RIGHT HAND Performed at [facility], [address], [place], [place] [address]    Special Requests   Final    AEROBIC BOTTLE ONLY Blood Culture adequate volume Performed at [facility], [address], [place], [place] [address]    Culture  Final    NO GROWTH 5 DAYS Performed at [facility], [address] [place], [place] [address]    Report Status 07/05/2022 FINAL  Final  Aerobic/Anaerobic Culture w Gram Stain (surgical/deep wound)     Status: None   Collection Time: 06/30/22  1:39 PM   Specimen: Path fluid; Abscess  Result Value Ref Range Status   Specimen Description   Final    ABSCESS PERIRECTAL Performed at [facility], [address], [place], [place] [address]    Special Requests   Final    NONE Performed at [facility], [address], [place], [place] [address]    Gram Stain   Final    NO WBC SEEN MODERATE GRAM POSITIVE COCCI MODERATE GRAM NEGATIVE RODS RARE GRAM POSITIVE RODS    Culture   Final    RARE GROUP B STREP(S.AGALACTIAE)ISOLATED TESTING AGAINST S. AGALACTIAE NOT ROUTINELY PERFORMED DUE TO PREDICTABILITY OF AMP/PEN/VAN SUSCEPTIBILITY. FEW PEPTOSTREPTOCOCCUS ANAEROBIUS Standardized susceptibility testing for this organism is not available. RARE PREVOTELLA BUCCAE BETA LACTAMASE POSITIVE Performed at [facility], [address] [place], [place] [address]    Report Status 07/07/2022 FINAL  Final  Culture, Respiratory w Gram Stain     Status: None   Collection Time: 07/03/22 10:36 AM   Specimen: Tracheal Aspirate; Respiratory  Result Value Ref Range  Status   Specimen Description   Final    TRACHEAL ASPIRATE Performed at [facility], [address], [place], [place] [address]    Special Requests   Final    NONE Performed at [facility], [address] [place], [place] [address]    Gram Stain   Final    ABUNDANT WBC PRESENT,BOTH PMN AND MONONUCLEAR NO ORGANISMS SEEN    Culture   Final    NO GROWTH 3 DAYS Performed at [facility], [address] [place], [place] [address]    Report Status 07/06/2022 FINAL  Final     Pertinent Lab.    Latest Ref Rng & Units 07/07/2022    3:05 AM 07/06/2022    4:02 AM 07/05/2022    4:24 AM  CBC  WBC 4.0 - 10.5 K/uL 14.7  15.4  12.6   Hemoglobin 13.0 - 17.0 g/dL 9.5  9.7  9.2   Hematocrit 39.0 - 52.0 % 30.2  30.6  29.3   Platelets 150 - 400 K/uL 485  474  344       Latest Ref Rng & Units 07/07/2022    3:05 AM 07/06/2022    4:02 AM 07/05/2022    4:24 AM  CMP  Glucose 70 - 99 mg/dL 742  595  638   BUN 6 - 20 mg/dL 10  11  13    Creatinine 0.61 - 1.24 mg/dL 7.56  4.33  2.95   Sodium 135 - 145 mmol/L 139  139  140   Potassium 3.5 - 5.1 mmol/L 4.1  4.2  3.4   Chloride 98 - 111 mmol/L 101  105  107   CO2 22 - 32 mmol/L 27  23  24    Calcium 8.9 - 10.3 mg/dL 8.2  8.0  7.2     Pertinent Imaging today Plain films and CT images have been personally visualized and interpreted; radiology reports have been reviewed. Decision making incorporated into the Impression /   ECHOCARDIOGRAM COMPLETE  Result Date: 07/04/2022    ECHOCARDIOGRAM REPORT   Patient Name:  Dorena Dew Date of Exam: 07/04/2022 Medical Rec #:  [id number]     Height:       67.0 in Accession #:    [id number]    Weight:       222.0 lb Date of Birth:  [birth year]     BSA:          2.114 m Patient Age:    32 years      BP:           122/70 mmHg Patient Gender: M             HR:           98 bpm. Exam Location:  Inpatient Procedure: 2D Echo, Cardiac Doppler and Color Doppler Indications:    Dyspnea   History:        Patient has no prior history of Echocardiogram examinations.                 Signs/Symptoms:Fever and acute respiratory failure; Risk                 Factors:Diabetes and abscess, sepsis.  Sonographer:    Wallie Char Referring Phys: [id number] [facility]  Sonographer Comments: Echo performed with patient supine and on artificial respirator, patient is obese and Technically challenging study due to limited acoustic windows. Image acquisition challenging due to patient body habitus. IMPRESSIONS  1. Left ventricular ejection fraction, by estimation, is 60 to 65%. The left ventricle has normal function. The left ventricle has no regional wall motion abnormalities. Left ventricular diastolic parameters were normal.  2. Right ventricular systolic function is normal. The right ventricular size is mildly enlarged.  3. The mitral valve is normal in structure. No evidence of mitral valve regurgitation. No evidence of mitral stenosis.  4. The aortic valve is normal in structure. Aortic valve regurgitation is not visualized. No aortic stenosis is present.  5. The inferior vena cava is normal in size with greater than 50% respiratory variability, suggesting right atrial pressure of 3 mmHg. FINDINGS  Left Ventricle: Left ventricular ejection fraction, by estimation, is 60 to 65%. The left ventricle has normal function. The left ventricle has no regional wall motion abnormalities. The left ventricular internal cavity size was normal in size. There is  no left ventricular hypertrophy. Left ventricular diastolic parameters were normal. Right Ventricle: The right ventricular size is mildly enlarged. No increase in right ventricular wall thickness. Right ventricular systolic function is normal. Left Atrium: Left atrial size was normal in size. Right Atrium: Right atrial size was normal in size. Pericardium: There is no evidence of pericardial effusion. Mitral Valve: The mitral valve is normal in structure. No evidence  of mitral valve regurgitation. No evidence of mitral valve stenosis. MV peak gradient, 5.5 mmHg. The mean mitral valve gradient is 3.0 mmHg. Tricuspid Valve: The tricuspid valve is normal in structure. Tricuspid valve regurgitation is trivial. No evidence of tricuspid stenosis. Aortic Valve: The aortic valve is normal in structure. Aortic valve regurgitation is not visualized. No aortic stenosis is present. Aortic valve mean gradient measures 7.0 mmHg. Aortic valve peak gradient measures 12.0 mmHg. Aortic valve area, by VTI measures 2.72 cm. Pulmonic Valve: The pulmonic valve was normal in structure. Pulmonic valve regurgitation is trivial. No evidence of pulmonic stenosis. Aorta: The aortic root is normal in size and structure. Venous: The inferior vena cava is normal in size with greater than 50% respiratory variability, suggesting right atrial pressure of 3 mmHg. IAS/Shunts: No  atrial level shunt detected by color flow Doppler.  LEFT VENTRICLE PLAX 2D LVIDd:         4.60 cm     Diastology LVIDs:         3.10 cm     LV e' medial:    9.98 cm/s LV PW:         1.00 cm     LV E/e' medial:  11.8 LV IVS:        0.90 cm     LV e' lateral:   11.50 cm/s LVOT diam:     1.90 cm     LV E/e' lateral: 10.3 LV SV:         78 LV SV Index:   37 LVOT Area:     2.84 cm  LV Volumes (MOD) LV vol d, MOD A2C: 82.3 ml LV vol d, MOD A4C: 78.4 ml LV vol s, MOD A2C: 28.0 ml LV vol s, MOD A4C: 27.4 ml LV SV MOD A2C:     54.3 ml LV SV MOD A4C:     78.4 ml LV SV MOD BP:      53.9 ml RIGHT VENTRICLE             IVC RV Basal diam:  4.20 cm     IVC diam: 1.80 cm RV S prime:     17.50 cm/s TAPSE (M-mode): 2.6 cm LEFT ATRIUM             Index        RIGHT ATRIUM           Index LA diam:        3.70 cm 1.75 cm/m   RA Area:     16.90 cm LA Vol (A2C):   25.2 ml 11.92 ml/m  RA Volume:   46.80 ml  22.14 ml/m LA Vol (A4C):   31.0 ml 14.66 ml/m LA Biplane Vol: 28.9 ml 13.67 ml/m  AORTIC VALVE AV Area (Vmax):    2.70 cm AV Area (Vmean):   2.40 cm  AV Area (VTI):     2.72 cm AV Vmax:           173.50 cm/s AV Vmean:          126.000 cm/s AV VTI:            0.286 m AV Peak Grad:      12.0 mmHg AV Mean Grad:      7.0 mmHg LVOT Vmax:         165.50 cm/s LVOT Vmean:        106.500 cm/s LVOT VTI:          0.275 m LVOT/AV VTI ratio: 0.96  AORTA Ao Root diam: 3.10 cm Ao Asc diam:  2.80 cm MITRAL VALVE                TRICUSPID VALVE MV Area (PHT): 3.56 cm     TR Peak grad:   15.7 mmHg MV Area VTI:   3.00 cm     TR Vmax:        198.00 cm/s MV Peak grad:  5.5 mmHg MV Mean grad:  3.0 mmHg     SHUNTS MV Vmax:       1.17 m/s     Systemic VTI:  0.28 m MV Vmean:      74.4 cm/s    Systemic Diam: 1.90 cm MV Decel Time: 213 msec MV E velocity: 118.00 cm/s MV A velocity: 80.60  cm/s MV E/A ratio:  1.46 Arvilla Meres MD Electronically signed by Arvilla Meres MD Signature Date/Time: 07/04/2022/12:21:01 PM    Final    [place] EKG SITE RITE  Result Date: 07/03/2022 If Site Rite image not attached, placement could not be confirmed due to current cardiac rhythm.  DG Chest Port 1 View  Result Date: 07/03/2022 CLINICAL DATA:  Hypoxia EXAM: PORTABLE CHEST 1 VIEW COMPARISON:  07/02/2022 FINDINGS: Endotracheal tube and gastric catheter are again seen and stable. Cardiac shadow is unchanged. Persistent changes of edema are noted bilaterally. No bony abnormality is seen. IMPRESSION: Diffuse pulmonary edema. Electronically Signed   By: Alcide Clever M.D.   On: 07/03/2022 03:06   DG Abd 1 View  Result Date: 07/02/2022 CLINICAL DATA:  Placement of NG tube EXAM: ABDOMEN - 1 VIEW COMPARISON:  01/17/2010 FINDINGS: Tip of NG tube is seen in the region of the antrum of the stomach. Bowel gas pattern is nonspecific. IMPRESSION: Tip of NG tube is seen in the antrum of the stomach. Electronically Signed   By: Ernie Avena M.D.   On: 07/02/2022 15:30   DG CHEST PORT 1 VIEW  Result Date: 07/02/2022 CLINICAL DATA:  Difficulty breathing, endotracheal tube placement EXAM: PORTABLE CHEST 1  VIEW COMPARISON:  Previous studies including the examination done earlier today FINDINGS: Transverse diameter of heart is increased. Diffuse alveolar densities are seen in both lungs which was not seen on the previous examination. Lateral costophrenic angles are indistinct. There is no pneumothorax. Tip of endotracheal tube is 7.5 cm above the carina. NG tube is noted traversing the esophagus with its distal portion in the stomach. IMPRESSION: There is interval appearance of alveolar densities in both lungs suggesting pulmonary edema. Part of this finding may be due to atelectasis. Tip of endotracheal tube is in the lower neck 7.5 cm above the carina. Electronically Signed   By: Ernie Avena M.D.   On: 07/02/2022 15:30   DG CHEST PORT 1 VIEW  Result Date: 07/02/2022 CLINICAL DATA:  Hypoxia. EXAM: PORTABLE CHEST 1 VIEW COMPARISON:  October 05, 2014. FINDINGS: The heart size and mediastinal contours are within normal limits. Both lungs are clear. The visualized skeletal structures are unremarkable. IMPRESSION: No active disease. Electronically Signed   By: Lupita Raider M.D.   On: 07/02/2022 13:50   CT ABDOMEN PELVIS W CONTRAST  Result Date: 06/29/2022 CLINICAL DATA:  Sepsis EXAM: CT ABDOMEN AND PELVIS WITH CONTRAST TECHNIQUE: Multidetector CT imaging of the abdomen and pelvis was performed using the standard protocol following bolus administration of intravenous contrast. RADIATION DOSE REDUCTION: This exam was performed according to the departmental dose-optimization program which includes automated exposure control, adjustment of the mA and/or kV according to patient size and/or use of iterative reconstruction technique. CONTRAST:  OMNIPAQUE IOHEXOL 300 MG/ML  SOLN COMPARISON:  04/13/2013 FINDINGS: Lower chest: No pleural effusion. No pneumothorax. Patchy atelectasis or infiltrate posteriorly at the left lung base. Hepatobiliary: No focal liver abnormality is seen. No gallstones, gallbladder  wall thickening, or biliary dilatation. Pancreas: Unremarkable. No pancreatic ductal dilatation or surrounding inflammatory changes. Spleen: Normal in size without focal abnormality. Adrenals/Urinary Tract: No adrenal mass. Symmetric renal enhancement without focal lesion, urolithiasis, or hydronephrosis. Urinary bladder is distended. Stomach/Bowel: Stomach incompletely distended, unremarkable. A few gas distended small bowel loops in the left mid abdomen, the proximal and distal loops are decompressed and unremarkable. Normal appendix. Colon is partially distended by gas and fecal material, unremarkable. Vascular/Lymphatic: No significant vascular findings are present.  No enlarged abdominal or pelvic lymph nodes. Reproductive: Prostate is unremarkable. Other: No ascites.  No free air. 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 Mild vertebral compression deformities T10 and T12, stable since previous. No acute findings. IMPRESSION: 1. No acute abdominal findings. 2. Patchy atelectasis or infiltrate posteriorly at the left lung base. Electronically Signed   By: Corlis Leak M.D.   On: 06/29/2022 21:58    I have personally 51 minutes involved in face-to-face and non-face-to-face activities for this patient on the day of the visit. Professional time spent includes the following activities: Preparing to see the patient (review of tests), Obtaining and/or reviewing separately obtained history (admission/discharge record), Performing a medically appropriate examination and/or evaluation , Ordering medications/tests/procedures, referring and communicating with other health care professionals, Documenting clinical information in the EMR, Independently interpreting results (not separately reported), Communicating results to the patient/family/caregiver, Counseling and educating the patient/family/caregiver and Care coordination (not separately reported).   Plan d/w requesting provider as well as ID  pharm D  Note: This document was prepared using dragon voice recognition software and may include unintentional dictation errors.   Electronically signed by:   Odette Fraction, MD Infectious Disease Physician [facility] Pager: [phone]

## 2022-07-07 NOTE — Progress Notes (Signed)
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 Post op on vent----ID consulted narrowed off of vancomycin and placed on ceftriaxone + Flagyl secondary to worsening--plan for discharge on oral Augmentin 6/6 Episode of hypoglycemia, 11L positive for admission  6/7 Self extubated overnight, remains on low dose Precedex and Levo this am  6/8 cardiology consulted 2/2 lateral ST elevation no reciprocal changes bedside echo normal LV RV-dynamic changes they signed off 6/8 to OR - wound irrigated and only minimal debridement necessary. Extubated in the afternoon.  Hospital-Problem based course  Sepsis 2/2 buttock abscess/Fournier's gangrene status post OR repair and multiple debridements Growing group B strep Wound seems stabilized and does not appear to need further visits to the OR Hydrotherapy ordered as per general surgery Continue ceftriaxone Flagyl-duration to be determined by general surgery-note white count still elevated likely reactive -pain control with Toradol oxycodone IR Dilaudid and as needed fentanyl for dressing changes as per general surgery  AKI on admission associated with metabolic acidosis from  sepsis/AKI Mild hypokalemia No need for replacement further of potassium, follow lab trends  DM TY 1 poorly controlled with hyper and hypoglycemia during this hospital stay A1c 15 on arrival Could not use insulin pump at his prior job-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 Fransico Him to see if patient can follow-up in his office in 2 to 3 weeks CBGs 210-250 on increased dose of Lantus 18 units  Reciprocal EKG changes Cardiology saw and signed off  Situational depression as above Resumed Lexapro 10 change Klonopin back to 0.5 daily   DVT prophylaxis: Lovenox Code Status: Full  family Communication: Discussed in detail plan of care with wife at the bedside Disposition:  Status is: Inpatient Remains inpatient appropriate because:   Requires further management discharge disposition planning and timing contingent on general surgery also    Subjective: Awake alert coherent no distress Feels much better from walked around in the room --Foley catheter, Flexi-Seal all removed Rates pain as much improved  Objective: Vitals:   07/06/22 1542 07/06/22 2036 07/06/22 2350 07/07/22 0432  BP: 127/72 134/71 (!) 112/56 116/61  Pulse: (!) 109 (!) 104 (!) 115 (!) 104  Resp: 18 20 20  (!) 22  Temp: 99.3 F (37.4 C) 99 F (37.2 C) 99.5 F (37.5 C) 98.2 F (36.8 C)  TempSrc: Oral Oral Oral Oral  SpO2: 97% 97% 93% 96%  Weight:      Height:        Intake/Output Summary (Last 24 hours) at 07/07/2022 1245 Last data filed at 07/07/2022 1044 Gross  per 24 hour  Intake 627.65 ml  Output 550 ml  Net 77.65 ml    Filed Weights   06/29/22 1532 06/30/22 1232  Weight: 100.7 kg 100.7 kg    Examination:  Alert coherent noted distress less anxious CTAB no added sound Abdomen soft no rebound-I did not examine his perineal  wounds Trace lower extremity edema  Data Reviewed: personally reviewed   CBC    Component Value Date/Time   WBC 14.7 (H) 07/07/2022 0305   RBC 3.38 (L) 07/07/2022 0305   HGB 9.5 (L) 07/07/2022 0305   HCT 30.2 (L) 07/07/2022 0305   PLT 485 (H) 07/07/2022 0305   MCV 89.3 07/07/2022 0305   MCH 28.1 07/07/2022 0305   MCHC 31.5 07/07/2022 0305   RDW 14.9 07/07/2022 0305   LYMPHSABS 0.9 07/01/2022 0429   MONOABS 1.6 (H) 07/01/2022 0429   EOSABS 0.1 07/01/2022 0429   BASOSABS 0.1 07/01/2022 0429      Latest Ref Rng & Units 07/07/2022    3:05 AM 07/06/2022    4:02 AM 07/05/2022    4:24 AM  CMP  Glucose 70 - 99 mg/dL 782  956  213   BUN 6 - 20 mg/dL 10  11  13    Creatinine 0.61 - 1.24 mg/dL 0.86  5.78  4.69   Sodium 135 - 145 mmol/L 139  139  140   Potassium 3.5 - 5.1 mmol/L 4.1  4.2  3.4   Chloride 98 - 111 mmol/L 101  105  107   CO2 22 - 32 mmol/L 27  23  24    Calcium 8.9 - 10.3 mg/dL 8.2  8.0  7.2      Radiology Studies: No results found.   Scheduled Meds:  acetaminophen  1,000 mg Oral Q6H   Chlorhexidine Gluconate Cloth  6 each Topical Daily   escitalopram  10 mg Oral QHS   famotidine  20 mg Oral BID   insulin aspart  0-20 Units Subcutaneous TID WC   insulin aspart  0-5 Units Subcutaneous QHS   insulin detemir  18 Units Subcutaneous BID   lip balm   Topical BID   methocarbamol  500 mg Oral QID   Ensure Max Protein  11 oz Oral BID   sodium chloride flush  10-40 mL Intracatheter Q12H   sodium chloride flush  3 mL Intravenous Q12H   Continuous Infusions:  sodium chloride Stopped (07/06/22 0357)   cefTRIAXone (ROCEPHIN)  IV 2 g (07/06/22 1641)   metronidazole 500 mg (07/07/22 0605)     LOS: 7 days   Time spent: 25  Rhetta Mura, MD [facility] To contact the attending provider between 7A-7P or the covering provider during after hours 7P-7A, please log into the web site [email or web address] and access using universal [place] password for that web site.  If you do not have the password, please call the hospital operator.  07/07/2022, 12:45 PM

## 2022-07-08 DIAGNOSIS — N493 Fournier gangrene: Secondary | ICD-10-CM | POA: Diagnosis not present

## 2022-07-08 LAB — CBC
HCT: 29.7 % — ABNORMAL LOW (ref 39.0–52.0)
Hemoglobin: 9.4 g/dL — ABNORMAL LOW (ref 13.0–17.0)
MCH: 28.1 pg (ref 26.0–34.0)
MCHC: 31.6 g/dL (ref 30.0–36.0)
MCV: 88.9 fL (ref 80.0–100.0)
Platelets: 473 10*3/uL — ABNORMAL HIGH (ref 150–400)
RBC: 3.34 MIL/uL — ABNORMAL LOW (ref 4.22–5.81)
RDW: 14.8 % (ref 11.5–15.5)
WBC: 12 10*3/uL — ABNORMAL HIGH (ref 4.0–10.5)
nRBC: 0 % (ref 0.0–0.2)

## 2022-07-08 LAB — BASIC METABOLIC PANEL
Anion gap: 11 (ref 5–15)
BUN: 10 mg/dL (ref 6–20)
CO2: 27 mmol/L (ref 22–32)
Calcium: 8.1 mg/dL — ABNORMAL LOW (ref 8.9–10.3)
Chloride: 98 mmol/L (ref 98–111)
Creatinine, Ser: 1.02 mg/dL (ref 0.61–1.24)
GFR, Estimated: >60 mL/min (ref >60)
Glucose, Bld: 266 mg/dL — ABNORMAL HIGH (ref 70–99)
Potassium: 4 mmol/L (ref 3.5–5.1)
Sodium: 136 mmol/L (ref 135–145)

## 2022-07-08 LAB — PHOSPHORUS: Phosphorus: 2.8 mg/dL (ref 2.5–4.6)

## 2022-07-08 LAB — GLUCOSE, CAPILLARY
Glucose-Capillary: 252 mg/dL — ABNORMAL HIGH (ref 70–99)
Glucose-Capillary: 224 mg/dL — ABNORMAL HIGH (ref 70–99)
Glucose-Capillary: 208 mg/dL — ABNORMAL HIGH (ref 70–99)
Glucose-Capillary: 211 mg/dL — ABNORMAL HIGH (ref 70–99)

## 2022-07-08 LAB — MAGNESIUM: Magnesium: 1.5 mg/dL — ABNORMAL LOW (ref 1.7–2.4)

## 2022-07-08 MED ORDER — INSULIN DETEMIR 100 UNIT/ML ~~LOC~~ SOLN
20.0000 [IU] | Freq: Two times a day (BID) | SUBCUTANEOUS | Status: DC
  Administered 2022-07-08 – 2022-07-09 (×3): 20 [IU] via SUBCUTANEOUS
  Filled 2022-07-08 (×4): qty 0.2

## 2022-07-08 MED ORDER — AMOXICILLIN-POT CLAVULANATE 875-125 MG PO TABS
1.0000 | ORAL_TABLET | Freq: Two times a day (BID) | ORAL | Status: DC
  Administered 2022-07-08 – 2022-07-10 (×5): 1 via ORAL
  Filled 2022-07-08 (×6): qty 1

## 2022-07-08 MED ORDER — ADULT MULTIVITAMIN W/MINERALS CH
1.0000 | ORAL_TABLET | Freq: Every day | ORAL | Status: DC
  Administered 2022-07-08 – 2022-07-10 (×3): 1 via ORAL
  Filled 2022-07-08 (×3): qty 1

## 2022-07-08 MED ORDER — VITAMIN C 500 MG PO TABS
500.0000 mg | ORAL_TABLET | Freq: Two times a day (BID) | ORAL | Status: DC
  Administered 2022-07-09 – 2022-07-10 (×3): 500 mg via ORAL
  Filled 2022-07-08 (×3): qty 1

## 2022-07-08 MED ORDER — ZINC SULFATE 220 (50 ZN) MG PO CAPS
220.0000 mg | ORAL_CAPSULE | Freq: Every day | ORAL | Status: DC
  Administered 2022-07-09: 220 mg via ORAL
  Filled 2022-07-08: qty 1

## 2022-07-08 NOTE — Progress Notes (Signed)
4 Days Post-Op   Subjective/Chief Complaint: Just got out of the shower and getting ready to start hydrotherapy.  No new complaints   Objective: Vital signs in last 24 hours: Temp:  [97.5 F (36.4 C)-99.1 F (37.3 C)] 99 F (37.2 C) (06/11 0636) Pulse Rate:  [73-97] 79 (06/11 0636) Resp:  [15-20] 20 (06/10 2107) BP: (122-138)/(64-85) 126/64 (06/11 0636) SpO2:  [88 %-97 %] 88 % (06/11 0636) Last BM Date : 07/07/22  Intake/Output from previous day: 06/10 0701 - 06/11 0700 In: 820 [P.O.:720; IV Piggyback:100] Out: -  Intake/Output this shift: Total I/O In: 120 [P.O.:120] Out: 500 [Urine:500]  GU: wound much cleaner today with much less fibrinous tissue.  Still with some on right side of scrotum, but perineum and other aspects of scrotum much cleaner   Lab Results:  Recent Labs    07/07/22 0305 07/08/22 0314  WBC 14.7* 12.0*  HGB 9.5* 9.4*  HCT 30.2* 29.7*  PLT 485* 473*   BMET Recent Labs    07/07/22 0305 07/08/22 0314  NA 139 136  K 4.1 4.0  CL 101 98  CO2 27 27  GLUCOSE 211* 266*  BUN 10 10  CREATININE 1.08 1.02  CALCIUM 8.2* 8.1*   PT/INR No results for input(s): "LABPROT", "INR" in the last 72 hours. ABG No results for input(s): "PHART", "HCO3" in the last 72 hours.  Invalid input(s): "PCO2", "PO2"   Studies/Results: No results found.  Anti-infectives: Anti-infectives (From admission, onward)    Start     Dose/Rate Route Frequency Ordered Stop   07/08/22 1000  amoxicillin-clavulanate (AUGMENTIN) 875-125 MG per tablet 1 tablet        1 tablet Oral Every 12 hours 07/08/22 0834 07/15/22 0959   07/07/22 1500  amoxicillin (AMOXIL) capsule 250 mg        250 mg Oral  Once 07/07/22 1358 07/07/22 1642   07/02/22 1545  metroNIDAZOLE (FLAGYL) IVPB 500 mg  Status:  Discontinued        500 mg 100 mL/hr over 60 Minutes Intravenous Every 12 hours 07/02/22 1452 07/08/22 0834   07/02/22 1530  amoxicillin (AMOXIL) capsule 250 mg  Status:  Discontinued         250 mg Oral  Once 07/02/22 1430 07/02/22 1452   07/02/22 1530  metroNIDAZOLE (FLAGYL) tablet 500 mg  Status:  Discontinued        500 mg Oral Every 12 hours 07/02/22 1436 07/02/22 1452   07/02/22 1433  cefTRIAXone (ROCEPHIN) 2 g in sodium chloride 0.9 % 100 mL IVPB  Status:  Discontinued        2 g 200 mL/hr over 30 Minutes Intravenous Every 24 hours 07/02/22 1434 07/08/22 0834   07/02/22 1430  metroNIDAZOLE (FLAGYL) IVPB 500 mg  Status:  Discontinued        500 mg 100 mL/hr over 60 Minutes Intravenous Every 12 hours 07/02/22 1400 07/02/22 1436   07/02/22 1200  amoxicillin (AMOXIL) capsule 500 mg  Status:  Discontinued        500 mg Oral  Once 07/02/22 1018 07/02/22 1430   07/02/22 1115  cefTRIAXone (ROCEPHIN) 2 g in sodium chloride 0.9 % 100 mL IVPB  Status:  Discontinued        2 g 200 mL/hr over 30 Minutes Intravenous Every 24 hours 07/02/22 1018 07/02/22 1434   07/01/22 2000  vancomycin (VANCOREADY) IVPB 1750 mg/350 mL  Status:  Discontinued        1,750 mg 175 mL/hr over  120 Minutes Intravenous Every 36 hours 06/30/22 1801 07/02/22 1018   07/01/22 0000  ceFEPIme (MAXIPIME) 2 g in sodium chloride 0.9 % 100 mL IVPB  Status:  Discontinued        2 g 200 mL/hr over 30 Minutes Intravenous Every 12 hours 06/30/22 1801 07/02/22 1018   06/30/22 2100  vancomycin (VANCOREADY) IVPB 750 mg/150 mL  Status:  Discontinued        750 mg 150 mL/hr over 60 Minutes Intravenous Every 12 hours 06/30/22 0718 06/30/22 1801   06/30/22 0800  vancomycin (VANCOREADY) IVPB 2000 mg/400 mL        2,000 mg 200 mL/hr over 120 Minutes Intravenous NOW 06/30/22 0705 06/30/22 1222   06/30/22 0800  ceFEPIme (MAXIPIME) 2 g in sodium chloride 0.9 % 100 mL IVPB  Status:  Discontinued        2 g 200 mL/hr over 30 Minutes Intravenous Every 8 hours 06/30/22 0708 06/30/22 1801   06/29/22 1800  cefTRIAXone (ROCEPHIN) 2 g in sodium chloride 0.9 % 100 mL IVPB        2 g 200 mL/hr over 30 Minutes Intravenous  Once 06/29/22  1747 06/29/22 1910       Assessment/Plan: POD 6, s/p I&D of perirectal abscess, POD 4, s/pdebridement NSTI, Dr. Dwain Sarna -WBC decreasing -cont abx therapy per ID recs -cx with Group B strep, ID has seen him -cont BID WD dressing changes and hydrotherapy.  Likely needs just 2-3 more days and could anticipate likely DC on Thursday if all goes well. -message send to primary   FEN -  CM diet, just stool softener, hold miralax to minimize diarrhea VTE - Lovenox ID - maxipime, vanc per ID   Type I diabetes  Letha Cape 07/08/2022

## 2022-07-08 NOTE — Progress Notes (Signed)
Nutrition Follow-up  DOCUMENTATION CODES:   Obesity unspecified  INTERVENTION:  - Carb Modified diet.  - Ensure Max po BID, each supplement provides 150 kcal and 30 grams of protein.  - Discussed importance of emphasizing protein rich food sources at all meals to promote healing.  - Multivitamin with minerals daily, 500mg  vitamin C BID x30 days, 220mg  zinc x14 days to promote healing.  - Monitor weight trends.   NUTRITION DIAGNOSIS:   Increased nutrient needs related to acute illness as evidenced by estimated needs. *new  GOAL:   Patient will meet greater than or equal to 90% of their needs *progressing   MONITOR:   PO intake, Supplement acceptance, Labs, Weight trends  REASON FOR ASSESSMENT:   Consult Enteral/tube feeding initiation and management  ASSESSMENT:   32 y.o. male with PMH diabetes mellitus who presented with evolving buttocks abscess.  6/3 Admit, I&D 6/5 debridement of necrotizing infection, remained intubated post op 6/7 Extubated 6/8 OR for wound irrigation, minimal debridement   Patient endorses his appetite is finally started to come back and he has eaten well today.  Reports having had a sausage, egg, and cheese biscuit for breakfast and pot roast with mashed potatoes and corn for lunch.  Stressed importance of increased protein needs to promote healing of wound. Encouraged patient to order and try and consume 3 meals a day, emphasizing protein rich food sources. Also reviewed importance of blood sugar control for optimal wound healing. Patient endorsed understanding. Discussed adding vitamins/mineral to support healing.  Ordered Ensure Max for patient but he reports being told by MD he shouldn't drink it due to concern it could cause him diarrhea.  Reached out to MD to discuss.   Medications reviewed and include: -  Labs reviewed:  Magnesium 1.6 HA1C 12.3 Blood Glucose 178-252 x24 hours   Diet Order:   Diet Order             Diet Carb  Modified Fluid consistency: Thin; Room service appropriate? Yes  Diet effective now                   EDUCATION NEEDS:  Education needs have been addressed  Skin:  Skin Assessment: Skin Integrity Issues: Skin Integrity Issues:: Incisions, Other (Comment) Incisions: Left Buttocks; Perineum Other: Open Left Groin wound after repeated surgical I&D  Last BM:  6/10  Height:  Ht Readings from Last 1 Encounters:  06/30/22 5\' 7"  (1.702 m)   Weight:  Wt Readings from Last 1 Encounters:  06/30/22 100.7 kg   Ideal Body Weight:  67.27 kg  BMI:  Body mass index is 34.77 kg/m.  Estimated Nutritional Needs:  Kcal:  2300-2500 kcals Protein:  120-140 grams Fluid:  >/= 2.3L    [name] RD, LDN For contact information, refer to [facility].

## 2022-07-08 NOTE — Inpatient Diabetes Management (Signed)
Inpatient Diabetes Program Recommendations  AACE/ADA: New Consensus Statement on Inpatient Glycemic Control (2015)  Target Ranges:  Prepandial:   less than 140 mg/dL      Peak postprandial:   less than 180 mg/dL (1-2 hours)      Critically ill patients:  140 - 180 mg/dL   Lab Results  Component Value Date   GLUCAP 224 (H) 07/08/2022   HGBA1C 12.3 (H) 06/30/2022    Review of Glycemic Control  Diabetes history: DM1 Outpatient Diabetes medications: Toujeo 60 QHS, Humalog 10-20 QID Current orders for Inpatient glycemic control: Levemir 20 BID, Novolog 0-20 TID with meals and 0-5 HS  CBGs 252, 224 Needs meal coverage insulin since pt is Type 1 and does not make any insulin.  Inpatient Diabetes Program Recommendations:    Add Novolog 6 units TID with meals (Do not give if eating < 50% meal)  Decrease Novolog correction to 0-9 units TID with meals and 0-5 HS (since Type 1 and sensitive to insulin)  Went to bedside and pt is sleeping.  Will continue to follow.  Thank you. Ailene Ards, RD, LDN, CDCES Inpatient Diabetes Coordinator [phone]

## 2022-07-08 NOTE — Progress Notes (Signed)
WOUND CARE---PT HYDROTHERAPY TX NOTE  07/08/22 1500  Subjective Assessment  Subjective agreeable to wound care  Date of Onset 06/30/22      Prior tx-s/p surgical I&D x 2  Hospital course/PMH: 32-yo male admitted evolving abscess on his buttock and fevers as high as 103, Urgent care referred pt to [facility] ED where he was further worked up, including an unsuccessful I&D attempt, he was sent to [facility] for admission and CT scan. CT scan did not describe any soft tissue changes. Surgery was consulted. found to have necrotizing infection and underwent I&D x2. Postoperatively he was extubated, but quickly desaturated and was reintubated. He was transferred to the intensive care unit where PCCM ws consulted. PMH: poorly controlled diabetes and obesity   07/08/22- seen in conjunction with general surgery team today; pt was premedicated for procedure with oral and IV meds, pain ~4-5/10 post procedure. wound is progressing well, incr granulation tissue present with decr necrotic tissue throughout wound bed. Began education with pt wife regarding dressing change, packing technique and reasoning for such.  Per discussion with PA will plan to continue pulsed lavage and selective debridement through Thursday, pt to likely d/c later this week.  Continue POC    Evaluation and Treatment  Evaluation and Treatment Procedures Explained to Patient/Family Yes  Evaluation and Treatment Procedures agreed to  Wound / Incision (Open or Dehisced) 07/07/22 Incision - Open Groin Left **PT ONLY** open wound after repeated surgical  I&D x2  Date First Assessed: 07/07/22   Wound Type: Incision - Open  Location: (c) Groin  Location Orientation: Left  Wound Description (Comments): **PT ONLY** open wound after repeated surgical  I&D x2  Present on Admission: No  Wound Image   Dressing Type Gauze (Comment);ABD;Moist to dry;Mesh briefs  Dressing Changed New  Dressing Change Frequency Twice a day  Site / Wound Assessment  Granulation tissue;Painful;Red;Yellow  % Wound base Red or Granulating 60%  % Wound base Yellow/Fibrinous Exudate 40%  Peri-wound Assessment Intact  Wound Length (cm)  (see eval for dimensions)  Closure None  Drainage Amount Minimal  Drainage Description Serous;No odor  Treatment Cleansed;Debridement (Selective);Hydrotherapy (Pulse lavage);Packing (Saline gauze)  Hydrotherapy  Pulsed Lavage with Suction (psi) 8 psi  Pulsed Lavage with Suction - Normal Saline Used 1000 mL  Pulsed Lavage Tip Tip with splash shield  Pulsed lavage therapy - wound location perineum and L scrotum  Selective Debridement (non-excisional)  Selective Debridement (non-excisional) - Location scrotum, perineum extending to L gluteal area  Selective Debridement (non-excisional) - Tools Used Forceps;Scissors  Selective Debridement (non-excisional) - Tissue Removed yellow and brown slough; necrosing adipose and connective tissue  Wound Therapy - Assess/Plan/Recommendations  Wound Therapy - Clinical Statement pt will benefit from hydrotherapy to facilitate wound healing/closure,  decr bioburden  Factors Delaying/Impairing Wound Healing Diabetes Mellitus;Other (comment) (obesity)  Hydrotherapy Plan Debridement;Dressing change;Patient/family education;Pulsatile lavage with suction  Wound Therapy - Frequency Other (comment) (4x-then likely d/c)  Wound Therapy - Follow Up Recommendations dressing changes by family/patient;dressing changes by RN  Wound Therapy Goals - Improve the function of patient's integumentary system by progressing the wound(s) through the phases of wound healing by:  Decrease Necrotic Tissue to 25  Decrease Necrotic Tissue - Progress Updated due to goal met  Increase Granulation Tissue to 75  Increase Granulation Tissue - Progress Updated due to goal met  Decrease Length/Width/Depth by (cm) .2/.2/.3  Decrease Length/Width/Depth - Progress Progressing toward goal  Improve Drainage Characteristics  Min  Improve Drainage Characteristics -  Progress Progressing toward goal  Patient/Family will be able to  verbalize dressing changes  Patient/Family Instruction Goal - Progress Progressing toward goal  Goals/treatment plan/discharge plan were made with and agreed upon by patient/family Yes  Time For Goal Achievement 2 weeks  Wound Therapy - Potential for Goals Excellent   Delice Bison, PT  Acute Rehab Dept [facility]) [phone]  07/08/2022

## 2022-07-08 NOTE — Progress Notes (Signed)
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 Post op on vent----ID consulted narrowed off of vancomycin and placed on ceftriaxone + Flagyl secondary to worsening--plan for discharge on oral Augmentin 6/6 Episode of hypoglycemia, 11L positive for admission  6/7 Self extubated overnight, remains on low dose Precedex and Levo this am  6/8 cardiology consulted 2/2 lateral ST elevation no reciprocal changes bedside echo normal LV RV-dynamic changes they signed off 6/8 to OR - wound irrigated and only minimal debridement necessary. Extubated in the afternoon.  Hospital-Problem based course  Sepsis 2/2 buttock abscess/Fournier's gangrene status post OR repair and multiple debridements Growing group B strep Wound seems stabilized no need to go to OR Hydrotherapy  per general surgery Per ID change ceftriaxone->Augmentin , end of treatment 6/7 3 -pain control with Toradol oxycodone IR Dilaudid and as needed fentanyl for dressing changes as per general surgery  AKI on admission associated with metabolic acidosis from sepsis/AKI Mild hypokalemia No need for replacement further of potassium, follow lab trends  DM TY  1 poorly controlled with hyper and hypoglycemia during this hospital stay A1c 15 on arrival Could not use insulin pump at his prior job-his NP manages his diabetes--- he was changed to sliding scale subcu insulin because the pump was falling 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 and this was causing dangerous lows and highs Patient will call endocrinology office on discharge and set up appointment CBGs elevated 250 range therefore increased dose of Lantus to 20 units  Reciprocal EKG changes Cardiology saw and signed off  Situational depression as above Resumed Lexapro 10 change Klonopin back to 0.5 daily   DVT prophylaxis: Lovenox Code Status: Full  family Communication: Discussed in detail plan of care with wife at the bedside Disposition:  Status is: Inpatient Remains inpatient appropriate because:  Likely can discharge Thursday once hydrotherapy is complete per general surgery  Subjective:  Rousable no distress seems comfortable pain 2/10 no other complaints  Objective: Vitals:   07/07/22 1817 07/07/22 2107 07/08/22 0636 07/08/22 1223  BP: 135/85 137/68 126/64 127/70  Pulse: 93 94 79 91  Resp: 18 20  17   Temp: 98.2 F (36.8 C) 99.1 F (37.3 C) 99 F (37.2 C) 98.8 F (37.1 C)  TempSrc: Oral Oral Oral Oral  SpO2: 93% 97% (!) 88% 92%  Weight:      Height:        Intake/Output Summary (Last 24 hours) at 07/08/2022 1457 Last data filed at 07/08/2022 1300 Gross per 24 hour  Intake 820 ml  Output 500 ml  Net 320 ml    Filed Weights   06/29/22 1532 06/30/22 1232  Weight: 100.7 kg 100.7 kg    Examination:  Alert coherent  S1-S2 slightly tachycardic which is his baseline CTAB no added sound Abdomen soft I did not examine perineum no lower extremity edema  Data Reviewed: personally reviewed   CBC    Component Value Date/Time   WBC 12.0 (H) 07/08/2022 0314   RBC 3.34 (L) 07/08/2022 0314   HGB 9.4 (L) 07/08/2022 0314   HCT 29.7 (L) 07/08/2022 0314    PLT 473 (H) 07/08/2022 0314   MCV 88.9 07/08/2022 0314   MCH 28.1 07/08/2022 0314   MCHC 31.6 07/08/2022 0314   RDW 14.8 07/08/2022 0314   LYMPHSABS 0.9 07/01/2022 0429   MONOABS 1.6 (H) 07/01/2022 0429   EOSABS 0.1 07/01/2022 0429   BASOSABS 0.1 07/01/2022 0429      Latest Ref Rng & Units 07/08/2022    3:14 AM 07/07/2022    3:05 AM 07/06/2022    4:02 AM  CMP  Glucose 70 - 99 mg/dL 409  811  914   BUN 6 - 20 mg/dL 10  10  11    Creatinine 0.61 - 1.24 mg/dL 7.82  9.56  2.13   Sodium 135 - 145 mmol/L 136  139  139   Potassium 3.5 - 5.1 mmol/L 4.0  4.1  4.2   Chloride 98 - 111 mmol/L 98  101  105   CO2 22 - 32 mmol/L 27  27  23    Calcium 8.9 - 10.3 mg/dL 8.1  8.2  8.0      Radiology Studies: No results found.   Scheduled Meds:  acetaminophen  1,000 mg Oral Q6H   amoxicillin-clavulanate  1 tablet Oral Q12H   Chlorhexidine Gluconate Cloth  6 each Topical Daily   clonazePAM  0.5 mg Oral Daily   escitalopram  10 mg Oral QHS   famotidine  20 mg Oral BID   insulin aspart  0-20 Units Subcutaneous TID WC   insulin aspart  0-5 Units Subcutaneous QHS   insulin detemir  20 Units Subcutaneous BID   lip balm   Topical BID   methocarbamol  500 mg Oral QID   Ensure Max Protein  11 oz Oral BID   sodium chloride flush  10-40 mL Intracatheter Q12H   sodium chloride flush  3 mL Intravenous Q12H   Continuous Infusions:  sodium chloride Stopped (07/06/22 0357)     LOS: 8 days   Time spent: 44  Rhetta Mura, MD [facility] To contact the attending provider between 7A-7P or the covering provider during after hours 7P-7A, please log into the web site [email or web address] and access using universal [place] password for that web site. If you do not have the password, please call the hospital operator.  07/08/2022, 2:57 PM

## 2022-07-08 NOTE — Progress Notes (Signed)
4 Days Post-Op   Subjective/Chief Complaint:  1 - Severe Scrotal-Buttocks Soft Tissue Infection - admitted through ER 6/3 with severe buttocks soft tissue infection / abscess and had initial operative debridement 06/30/22. Diabetic with A1c 12's. WCX 6/3 GPC, GNR, pending and placed on rocephin. 2nd stage debridement 07/02/22 and noted some severe scrotal swelling and possilbe tracking towards scrotum. 3rd stage 07/04/22 with near complete resolution of local edema and clean edges but some significant scrotal / perineal skin loss.  Today "[name]" is improving. NO high grade fevers. Doing OK with dressing changes. He did have some trouble coming out of anesthesia recently.    Objective: Vital signs in last 24 hours: Temp:  [97.5 F (36.4 C)-99.1 F (37.3 C)] 98.8 F (37.1 C) (06/11 1223) Pulse Rate:  [73-94] 91 (06/11 1223) Resp:  [15-20] 17 (06/11 1223) BP: (123-138)/(64-85) 127/70 (06/11 1223) SpO2:  [88 %-97 %] 92 % (06/11 1223) Last BM Date : 07/07/22  Intake/Output from previous day: 06/10 0701 - 06/11 0700 In: 820 [P.O.:720; IV Piggyback:100] Out: -  Intake/Output this shift: Total I/O In: 835 [P.O.:835] Out: 500 [Urine:500]  NAD, Pleasant, Family at bedside Non-labored breathing on RA RRR Rt PICC in place c/d/I SNTND COmplex scrotal / perineal / buttosk wound with abtou 12x4cm loss of lateral scrotal / perineal sking and subQ, clean edged.  NO c/c/e  Lab Results:  Recent Labs    07/07/22 0305 07/08/22 0314  WBC 14.7* 12.0*  HGB 9.5* 9.4*  HCT 30.2* 29.7*  PLT 485* 473*   BMET Recent Labs    07/07/22 0305 07/08/22 0314  NA 139 136  K 4.1 4.0  CL 101 98  CO2 27 27  GLUCOSE 211* 266*  BUN 10 10  CREATININE 1.08 1.02  CALCIUM 8.2* 8.1*   PT/INR No results for input(s): "LABPROT", "INR" in the last 72 hours. ABG No results for input(s): "PHART", "HCO3" in the last 72 hours.  Invalid input(s): "PCO2", "PO2"  Studies/Results: No results  found.  Anti-infectives: Anti-infectives (From admission, onward)    Start     Dose/Rate Route Frequency Ordered Stop   07/08/22 1000  amoxicillin-clavulanate (AUGMENTIN) 875-125 MG per tablet 1 tablet        1 tablet Oral Every 12 hours 07/08/22 0834 07/15/22 0959   07/07/22 1500  amoxicillin (AMOXIL) capsule 250 mg        250 mg Oral  Once 07/07/22 1358 07/07/22 1642   07/02/22 1545  metroNIDAZOLE (FLAGYL) IVPB 500 mg  Status:  Discontinued        500 mg 100 mL/hr over 60 Minutes Intravenous Every 12 hours 07/02/22 1452 07/08/22 0834   07/02/22 1530  amoxicillin (AMOXIL) capsule 250 mg  Status:  Discontinued        250 mg Oral  Once 07/02/22 1430 07/02/22 1452   07/02/22 1530  metroNIDAZOLE (FLAGYL) tablet 500 mg  Status:  Discontinued        500 mg Oral Every 12 hours 07/02/22 1436 07/02/22 1452   07/02/22 1433  cefTRIAXone (ROCEPHIN) 2 g in sodium chloride 0.9 % 100 mL IVPB  Status:  Discontinued        2 g 200 mL/hr over 30 Minutes Intravenous Every 24 hours 07/02/22 1434 07/08/22 0834   07/02/22 1430  metroNIDAZOLE (FLAGYL) IVPB 500 mg  Status:  Discontinued        500 mg 100 mL/hr over 60 Minutes Intravenous Every 12 hours 07/02/22 1400 07/02/22 1436   07/02/22 1200  amoxicillin (AMOXIL) capsule 500 mg  Status:  Discontinued        500 mg Oral  Once 07/02/22 1018 07/02/22 1430   07/02/22 1115  cefTRIAXone (ROCEPHIN) 2 g in sodium chloride 0.9 % 100 mL IVPB  Status:  Discontinued        2 g 200 mL/hr over 30 Minutes Intravenous Every 24 hours 07/02/22 1018 07/02/22 1434   07/01/22 2000  vancomycin (VANCOREADY) IVPB 1750 mg/350 mL  Status:  Discontinued        1,750 mg 175 mL/hr over 120 Minutes Intravenous Every 36 hours 06/30/22 1801 07/02/22 1018   07/01/22 0000  ceFEPIme (MAXIPIME) 2 g in sodium chloride 0.9 % 100 mL IVPB  Status:  Discontinued        2 g 200 mL/hr over 30 Minutes Intravenous Every 12 hours 06/30/22 1801 07/02/22 1018   06/30/22 2100  vancomycin  (VANCOREADY) IVPB 750 mg/150 mL  Status:  Discontinued        750 mg 150 mL/hr over 60 Minutes Intravenous Every 12 hours 06/30/22 0718 06/30/22 1801   06/30/22 0800  vancomycin (VANCOREADY) IVPB 2000 mg/400 mL        2,000 mg 200 mL/hr over 120 Minutes Intravenous NOW 06/30/22 0705 06/30/22 1222   06/30/22 0800  ceFEPIme (MAXIPIME) 2 g in sodium chloride 0.9 % 100 mL IVPB  Status:  Discontinued        2 g 200 mL/hr over 30 Minutes Intravenous Every 8 hours 06/30/22 0708 06/30/22 1801   06/29/22 1800  cefTRIAXone (ROCEPHIN) 2 g in sodium chloride 0.9 % 100 mL IVPB        2 g 200 mL/hr over 30 Minutes Intravenous  Once 06/29/22 1747 06/29/22 1910       Assessment/Plan:  Discussed natural history of wound healing with wet to dry packign only as is v. Parital surgical closure to hasten recovery. Either way, looking at weeks-mos of dressing changes, but estimat that parital closure would cut down time by at least 50% be decreasing area needed to granulate. He is interested in partial closure and I agree, prefer spinal if possible given tough emergence from general. Risks, benefits, alternatives, peri-op course disucssed. Tentativley posted fo rtomorrow afternoon, he will think on it over night.    Loletta Parish. 07/08/2022

## 2022-07-08 NOTE — Progress Notes (Signed)
Routine PICC care: site unremarkable. Appears pt is receiving only PRN IV meds. Please consider removal or exchange to single lumen if otherwise appropriate.

## 2022-07-08 NOTE — Progress Notes (Signed)
PT NOTE  PT signing off for mobility, pt amb I'ly with mobility team and family. Will continue to follow for hydro.  Delice Bison, PT  Acute Rehab Dept [facility]) [phone]  07/08/2022

## 2022-07-09 ENCOUNTER — Encounter (HOSPITAL_COMMUNITY)
Admission: EM | Disposition: A | Discharge status: Home Health | Payer: Self-pay | Source: Home / Self Care | Attending: Family Medicine

## 2022-07-09 DIAGNOSIS — N493 Fournier gangrene: Secondary | ICD-10-CM | POA: Diagnosis not present

## 2022-07-09 LAB — CBC
HCT: 30.8 % — ABNORMAL LOW (ref 39.0–52.0)
Hemoglobin: 9.7 g/dL — ABNORMAL LOW (ref 13.0–17.0)
MCH: 27.8 pg (ref 26.0–34.0)
MCHC: 31.5 g/dL (ref 30.0–36.0)
MCV: 88.3 fL (ref 80.0–100.0)
Platelets: 527 10*3/uL — ABNORMAL HIGH (ref 150–400)
RBC: 3.49 MIL/uL — ABNORMAL LOW (ref 4.22–5.81)
RDW: 14.7 % (ref 11.5–15.5)
WBC: 12.3 10*3/uL — ABNORMAL HIGH (ref 4.0–10.5)
nRBC: 0 % (ref 0.0–0.2)

## 2022-07-09 LAB — GLUCOSE, CAPILLARY
Glucose-Capillary: 224 mg/dL — ABNORMAL HIGH (ref 70–99)
Glucose-Capillary: 268 mg/dL — ABNORMAL HIGH (ref 70–99)
Glucose-Capillary: 165 mg/dL — ABNORMAL HIGH (ref 70–99)
Glucose-Capillary: 172 mg/dL — ABNORMAL HIGH (ref 70–99)

## 2022-07-09 SURGERY — EXPLORATION, SCROTUM
Anesthesia: General

## 2022-07-09 MED ORDER — INSULIN ASPART 100 UNIT/ML IJ SOLN
4.0000 [IU] | Freq: Three times a day (TID) | INTRAMUSCULAR | Status: DC
  Administered 2022-07-09 – 2022-07-10 (×3): 4 [IU] via SUBCUTANEOUS

## 2022-07-09 MED ORDER — INSULIN DETEMIR 100 UNIT/ML ~~LOC~~ SOLN
20.0000 [IU] | Freq: Two times a day (BID) | SUBCUTANEOUS | Status: DC
  Administered 2022-07-09 – 2022-07-10 (×2): 20 [IU] via SUBCUTANEOUS
  Filled 2022-07-09 (×3): qty 0.2

## 2022-07-09 MED ORDER — INSULIN ASPART 100 UNIT/ML IJ SOLN: 0.0000 [IU] | Freq: Every day | INTRAMUSCULAR | Status: DC

## 2022-07-09 MED ORDER — MAGNESIUM OXIDE -MG SUPPLEMENT 400 (240 MG) MG PO TABS
800.0000 mg | ORAL_TABLET | Freq: Two times a day (BID) | ORAL | Status: DC
  Administered 2022-07-09 – 2022-07-10 (×3): 800 mg via ORAL
  Filled 2022-07-09 (×3): qty 2

## 2022-07-09 MED ORDER — INSULIN ASPART 100 UNIT/ML IJ SOLN
0.0000 [IU] | Freq: Three times a day (TID) | INTRAMUSCULAR | Status: DC
  Administered 2022-07-09: 4 [IU] via SUBCUTANEOUS
  Administered 2022-07-10 (×2): 7 [IU] via SUBCUTANEOUS

## 2022-07-09 NOTE — Progress Notes (Addendum)
PHYSICAL THERAPY HYDROTHERAPY TREATMENT NOTE     07/09/22 1200  Subjective Assessment  Subjective agreeable to wound care  Date of Onset 06/30/22  Prior Treatments s/p surgical I&D x 2  Evaluation and Treatment  Evaluation and Treatment Procedures Explained to Patient/Family Yes  Evaluation and Treatment Procedures agreed to  Wound / Incision (Open or Dehisced) 07/07/22 Incision - Open Groin Left **PT ONLY** open wound after repeated surgical  I&D x2  Date First Assessed: 07/07/22   Wound Type: Incision - Open  Location: (c) Groin  Location Orientation: Left  Wound Description (Comments): **PT ONLY** open wound after repeated surgical  I&D x2  Present on Admission: No  Dressing Type Gauze;ABD;Mesh briefs;Normal saline moist dressing  Dressing Changed New  Dressing Change Frequency Twice a day  Site / Wound Assessment Granulation tissue;Painful;Red;Yellow  % Wound base Red or Granulating 60%  % Wound base Yellow/Fibrinous Exudate 40%  Peri-wound Assessment Intact  Closure None  Drainage Description No odor  Treatment Debridement (Selective);Hydrotherapy (Pulse lavage);Packing (Saline gauze)  Hydrotherapy  Pulsed Lavage with Suction (psi) 8 psi  Pulsed Lavage with Suction - Normal Saline Used 1000 mL  Pulsed Lavage Tip Tip with splash shield  Pulsed lavage therapy - wound location perineum and L scrotum  Selective Debridement (non-excisional)  Selective Debridement (non-excisional) - Location scrotum, perineum extending to L gluteal area  Selective Debridement (non-excisional) - Tools Used Forceps;Scissors  Selective Debridement (non-excisional) - Tissue Removed yellow and brown slough; necrosing adipose and connective tissue  Wound Therapy - Assess/Plan/Recommendations  Wound Therapy - Clinical Statement pt will benefit from hydrotherapy to facilitate wound healing/closure; decrease bioburden  Factors Delaying/Impairing Wound Healing Diabetes Mellitus;Other (comment)  Hydrotherapy  Plan Debridement;Dressing change;Patient/family education;Pulsatile lavage with suction  Wound Therapy - Frequency  (4x then likely d/c)  Wound Therapy - Follow Up Recommendations dressing changes by family/patient;dressing changes by RN  Wound Therapy Goals - Improve the function of patient's integumentary system by progressing the wound(s) through the phases of wound healing by:  Decrease Necrotic Tissue to 25  Decrease Necrotic Tissue - Progress Progressing toward goal  Increase Granulation Tissue to 75  Increase Granulation Tissue - Progress Progressing toward goal  Decrease Length/Width/Depth by (cm) .2/.2/.3  Decrease Length/Width/Depth - Progress Progressing toward goal  Improve Drainage Characteristics Min  Improve Drainage Characteristics - Progress Progressing toward goal  Patient/Family will be able to  verbalize dressing changes  Patient/Family Instruction Goal - Progress Progressing toward goal  Goals/treatment plan/discharge plan were made with and agreed upon by patient/family Yes  Time For Goal Achievement 2 weeks  Wound Therapy - Potential for Goals Excellent   Faye Ramsay, PT Acute Rehabilitation  Office: [phone]

## 2022-07-09 NOTE — Progress Notes (Signed)
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 Post op on vent----ID consulted narrowed off of vancomycin and placed on ceftriaxone + Flagyl secondary to worsening--plan for discharge on oral Augmentin 6/6 Episode of hypoglycemia, 11L positive for admission  6/7 Self extubated overnight, remains on low dose Precedex and Levo this am  6/8 cardiology consulted 2/2 lateral ST elevation no reciprocal changes bedside echo normal LV RV-dynamic changes they signed off 6/8 to OR - wound irrigated and only minimal debridement necessary. Extubated in the afternoon.  Hospital-Problem based course  Sepsis 2/2 buttock abscess/Fournier's gangrene status post OR repair and multiple debridements Growing group B strep Wound seems stabilized no need to go to OR--patient declined secondary closure by urology Dr. Berneice Heinrich Hydrotherapy  per general surgery should be completed in 2 to 3 days and patient will get a work note and excuse from general surgery at time of discharge Per ID change ceftriaxone->Augmentin , end of treatment 6/17  -pain control with Toradol oxycodone IR Dilaudid and as needed fentanyl for dressing changes as per  general surgery  AKI on admission associated with metabolic acidosis from sepsis/AKI Mild hypokalemia Repeat labs a.m.  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 Fransico Him to let him know CBGs elevated 250 range--- continue Levemir 20 twice daily, add mealtime insulin 4 units 3 times daily  Reciprocal EKG changes Cardiology saw and signed off  Situational depression as above Resumed Lexapro 10 change Klonopin back to 0.5 daily   DVT prophylaxis: Lovenox Code Status: Full  family Communication: Discussed in detail plan of care with wife at the bedside on several days Disposition:  Status is: Inpatient Remains inpatient appropriate because:  Likely can discharge when surgery for as he is stable from their perspective  Subjective:  Pain seems somewhat controlled however patient cannot still lie on his left side  Objective: Vitals:   07/08/22 1223 07/08/22 2115 07/09/22 0525 07/09/22 1229  BP: 127/70 131/75 110/61 127/71  Pulse: 91 100 92 86  Resp: 17 17 17 18   Temp: 98.8 F (37.1 C) 98.7 F (37.1 C) 99 F (37.2 C) 99.2 F (37.3 C)  TempSrc: Oral Oral Oral Oral  SpO2: 92% 96% 90% 96%  Weight:      Height:        Intake/Output Summary (Last 24 hours)  at 07/09/2022 1636 Last data filed at 07/09/2022 1100 Gross per 24 hour  Intake --  Output 1350 ml  Net -1350 ml    Filed Weights   06/29/22 1532 06/30/22 1232  Weight: 100.7 kg 100.7 kg    Examination:  Awake coherent S1-S2 slightly tachycardic which is his baseline CTAB no added sound Abdomen soft I did  not examine perineum   Data Reviewed: personally reviewed   CBC    Component Value Date/Time   WBC 12.3 (H) 07/09/2022 0330   RBC 3.49 (L) 07/09/2022 0330   HGB 9.7 (L) 07/09/2022 0330   HCT 30.8 (L) 07/09/2022 0330   PLT 527 (H) 07/09/2022 0330   MCV 88.3 07/09/2022 0330   MCH 27.8 07/09/2022 0330   MCHC 31.5 07/09/2022 0330   RDW 14.7 07/09/2022 0330   LYMPHSABS 0.9 07/01/2022 0429   MONOABS 1.6 (H) 07/01/2022 0429   EOSABS 0.1 07/01/2022 0429   BASOSABS 0.1 07/01/2022 0429      Latest Ref Rng & Units 07/08/2022    3:14 AM 07/07/2022    3:05 AM 07/06/2022    4:02 AM  CMP  Glucose 70 - 99 mg/dL 578  469  629   BUN 6 - 20 mg/dL 10  10  11    Creatinine 0.61 - 1.24 mg/dL 5.28  4.13  2.44   Sodium 135 - 145 mmol/L 136  139  139   Potassium 3.5 - 5.1 mmol/L 4.0  4.1  4.2   Chloride 98 - 111 mmol/L 98  101  105   CO2 22 - 32 mmol/L 27  27  23    Calcium 8.9 - 10.3 mg/dL 8.1  8.2  8.0      Radiology Studies: No results found.   Scheduled Meds:  acetaminophen  1,000 mg Oral Q6H   amoxicillin-clavulanate  1 tablet Oral Q12H   ascorbic acid  500 mg Oral BID   Chlorhexidine Gluconate Cloth  6 each Topical Daily   clonazePAM  0.5 mg Oral Daily   escitalopram  10 mg Oral QHS   famotidine  20 mg Oral BID   insulin aspart  0-20 Units Subcutaneous TID WC   insulin aspart  0-5 Units Subcutaneous QHS   insulin detemir  20 Units Subcutaneous BID   lip balm   Topical BID   magnesium oxide  800 mg Oral BID   methocarbamol  500 mg Oral QID   multivitamin with minerals  1 tablet Oral Daily   Ensure Max Protein  11 oz Oral BID   sodium chloride flush  10-40 mL Intracatheter Q12H   sodium chloride flush  3 mL Intravenous Q12H   zinc sulfate  220 mg Oral Daily   Continuous Infusions:  sodium chloride Stopped (07/06/22 0357)     LOS: 9 days   Time spent: 25  Rhetta Mura, MD [facility] To contact the attending provider between 7A-7P or the covering provider  during after hours 7P-7A, please log into the web site [email or web address] and access using universal [place] password for that web site. If you do not have the password, please call the hospital operator.  07/09/2022, 4:36 PM

## 2022-07-09 NOTE — Plan of Care (Signed)
  Problem: Education: Goal: Knowledge of General Education information will improve Description: Including pain rating scale, medication(s)/side effects and non-pharmacologic comfort measures Outcome: Not Progressing   Problem: Health Behavior/Discharge Planning: Goal: Ability to manage health-related needs will improve Outcome: Not Progressing   

## 2022-07-09 NOTE — Inpatient Diabetes Management (Signed)
Inpatient Diabetes Program Recommendations  AACE/ADA: New Consensus Statement on Inpatient Glycemic Control (2015)  Target Ranges:  Prepandial:   less than 140 mg/dL      Peak postprandial:   less than 180 mg/dL (1-2 hours)      Critically ill patients:  140 - 180 mg/dL   Lab Results  Component Value Date   GLUCAP 224 (H) 07/09/2022   HGBA1C 12.3 (H) 06/30/2022    Review of Glycemic Control  Latest Reference Range & Units 07/08/22 07:27 07/08/22 12:20 07/08/22 16:44 07/08/22 21:17 07/09/22 07:32  Glucose-Capillary 70 - 99 mg/dL 161 (H) 096 (H) 045 (H) 211 (H) 224 (H)   Diabetes history: DM1 Outpatient Diabetes medications: Toujeo 60 QHS, Humalog 10-20 QID Current orders for Inpatient glycemic control:  Levemir 20 BID Novolog 0-20 TID + HS  Ensure max bid Needs meal coverage insulin since pt is Type 1 and does not make any insulin.  Inpatient Diabetes Program Recommendations:    Add Novolog 6 units TID with meals (Do not give if eating < 50% meal)  Decrease Novolog correction to 0-9 units TID with meals and 0-5 HS (since Type 1 and sensitive to insulin)  Will continue to follow.  Thanks,  Christena Deem RN, MSN, BC-ADM Inpatient Diabetes Coordinator Team Pager [phone] (8a-5p)

## 2022-07-09 NOTE — Progress Notes (Signed)
5 Days Post-Op   Subjective/Chief Complaint: No new complaints today.   Objective: Vital signs in last 24 hours: Temp:  [98.7 F (37.1 C)-99 F (37.2 C)] 99 F (37.2 C) (06/12 0525) Pulse Rate:  [91-100] 92 (06/12 0525) Resp:  [17] 17 (06/12 0525) BP: (110-131)/(61-75) 110/61 (06/12 0525) SpO2:  [90 %-96 %] 90 % (06/12 0525) Last BM Date : 07/09/22  Intake/Output from previous day: 06/11 0701 - 06/12 0700 In: 835 [P.O.:835] Out: 800 [Urine:800] Intake/Output this shift: No intake/output data recorded.  GU: wound is stable and improving with decrease fibrin   Lab Results:  Recent Labs    07/08/22 0314 07/09/22 0330  WBC 12.0* 12.3*  HGB 9.4* 9.7*  HCT 29.7* 30.8*  PLT 473* 527*   BMET Recent Labs    07/07/22 0305 07/08/22 0314  NA 139 136  K 4.1 4.0  CL 101 98  CO2 27 27  GLUCOSE 211* 266*  BUN 10 10  CREATININE 1.08 1.02  CALCIUM 8.2* 8.1*   PT/INR No results for input(s): "LABPROT", "INR" in the last 72 hours. ABG No results for input(s): "PHART", "HCO3" in the last 72 hours.  Invalid input(s): "PCO2", "PO2"   Studies/Results: No results found.  Anti-infectives: Anti-infectives (From admission, onward)    Start     Dose/Rate Route Frequency Ordered Stop   07/08/22 1000  amoxicillin-clavulanate (AUGMENTIN) 875-125 MG per tablet 1 tablet        1 tablet Oral Every 12 hours 07/08/22 0834 07/15/22 0959   07/07/22 1500  amoxicillin (AMOXIL) capsule 250 mg        250 mg Oral  Once 07/07/22 1358 07/07/22 1642   07/02/22 1545  metroNIDAZOLE (FLAGYL) IVPB 500 mg  Status:  Discontinued        500 mg 100 mL/hr over 60 Minutes Intravenous Every 12 hours 07/02/22 1452 07/08/22 0834   07/02/22 1530  amoxicillin (AMOXIL) capsule 250 mg  Status:  Discontinued        250 mg Oral  Once 07/02/22 1430 07/02/22 1452   07/02/22 1530  metroNIDAZOLE (FLAGYL) tablet 500 mg  Status:  Discontinued        500 mg Oral Every 12 hours 07/02/22 1436 07/02/22 1452    07/02/22 1433  cefTRIAXone (ROCEPHIN) 2 g in sodium chloride 0.9 % 100 mL IVPB  Status:  Discontinued        2 g 200 mL/hr over 30 Minutes Intravenous Every 24 hours 07/02/22 1434 07/08/22 0834   07/02/22 1430  metroNIDAZOLE (FLAGYL) IVPB 500 mg  Status:  Discontinued        500 mg 100 mL/hr over 60 Minutes Intravenous Every 12 hours 07/02/22 1400 07/02/22 1436   07/02/22 1200  amoxicillin (AMOXIL) capsule 500 mg  Status:  Discontinued        500 mg Oral  Once 07/02/22 1018 07/02/22 1430   07/02/22 1115  cefTRIAXone (ROCEPHIN) 2 g in sodium chloride 0.9 % 100 mL IVPB  Status:  Discontinued        2 g 200 mL/hr over 30 Minutes Intravenous Every 24 hours 07/02/22 1018 07/02/22 1434   07/01/22 2000  vancomycin (VANCOREADY) IVPB 1750 mg/350 mL  Status:  Discontinued        1,750 mg 175 mL/hr over 120 Minutes Intravenous Every 36 hours 06/30/22 1801 07/02/22 1018   07/01/22 0000  ceFEPIme (MAXIPIME) 2 g in sodium chloride 0.9 % 100 mL IVPB  Status:  Discontinued  2 g 200 mL/hr over 30 Minutes Intravenous Every 12 hours 06/30/22 1801 07/02/22 1018   06/30/22 2100  vancomycin (VANCOREADY) IVPB 750 mg/150 mL  Status:  Discontinued        750 mg 150 mL/hr over 60 Minutes Intravenous Every 12 hours 06/30/22 0718 06/30/22 1801   06/30/22 0800  vancomycin (VANCOREADY) IVPB 2000 mg/400 mL        2,000 mg 200 mL/hr over 120 Minutes Intravenous NOW 06/30/22 0705 06/30/22 1222   06/30/22 0800  ceFEPIme (MAXIPIME) 2 g in sodium chloride 0.9 % 100 mL IVPB  Status:  Discontinued        2 g 200 mL/hr over 30 Minutes Intravenous Every 8 hours 06/30/22 0708 06/30/22 1801   06/29/22 1800  cefTRIAXone (ROCEPHIN) 2 g in sodium chloride 0.9 % 100 mL IVPB        2 g 200 mL/hr over 30 Minutes Intravenous  Once 06/29/22 1747 06/29/22 1910       Assessment/Plan: POD 7, s/p I&D of perirectal abscess, POD 5, s/pdebridement NSTI, Dr. Dwain Sarna -WBC stable -cont abx therapy per ID recs -cx with Group B  strep, ID has seen him -cont BID WD dressing changes and hydrotherapy.  Likely needs just 2 more days and could anticipate likely DC on Thursday or Friday if doing well    FEN -  CM diet, just stool softener, hold miralax to minimize diarrhea VTE - Lovenox ID - maxipime, vanc per ID   Type I diabetes  Letha Cape 07/09/2022

## 2022-07-09 NOTE — Plan of Care (Signed)
Pt requested overnight to consider elective washout and closure today. He declined this morning. Regular diet was provided. Please reach out with questions or concerns.

## 2022-07-10 DIAGNOSIS — N493 Fournier gangrene: Secondary | ICD-10-CM | POA: Diagnosis not present

## 2022-07-10 LAB — CBC WITH DIFFERENTIAL/PLATELET
Abs Immature Granulocytes: 0.56 10*3/uL — ABNORMAL HIGH (ref 0.00–0.07)
Basophils Absolute: 0.1 10*3/uL (ref 0.0–0.1)
Basophils Relative: 1 %
Eosinophils Absolute: 0.4 10*3/uL (ref 0.0–0.5)
Eosinophils Relative: 4 %
HCT: 30.6 % — ABNORMAL LOW (ref 39.0–52.0)
Hemoglobin: 9.7 g/dL — ABNORMAL LOW (ref 13.0–17.0)
Immature Granulocytes: 6 %
Lymphocytes Relative: 26 %
Lymphs Abs: 2.7 10*3/uL (ref 0.7–4.0)
MCH: 28 pg (ref 26.0–34.0)
MCHC: 31.7 g/dL (ref 30.0–36.0)
MCV: 88.2 fL (ref 80.0–100.0)
Monocytes Absolute: 0.8 10*3/uL (ref 0.1–1.0)
Monocytes Relative: 8 %
Neutro Abs: 5.7 10*3/uL (ref 1.7–7.7)
Neutrophils Relative %: 55 %
Platelets: 515 10*3/uL — ABNORMAL HIGH (ref 150–400)
RBC: 3.47 MIL/uL — ABNORMAL LOW (ref 4.22–5.81)
RDW: 14.8 % (ref 11.5–15.5)
WBC: 10.3 10*3/uL (ref 4.0–10.5)
nRBC: 0 % (ref 0.0–0.2)

## 2022-07-10 LAB — GLUCOSE, CAPILLARY
Glucose-Capillary: 228 mg/dL — ABNORMAL HIGH (ref 70–99)
Glucose-Capillary: 237 mg/dL — ABNORMAL HIGH (ref 70–99)

## 2022-07-10 LAB — RENAL FUNCTION PANEL
Albumin: 2.1 g/dL — ABNORMAL LOW (ref 3.5–5.0)
Anion gap: 8 (ref 5–15)
BUN: 13 mg/dL (ref 6–20)
CO2: 31 mmol/L (ref 22–32)
Calcium: 8 mg/dL — ABNORMAL LOW (ref 8.9–10.3)
Chloride: 97 mmol/L — ABNORMAL LOW (ref 98–111)
Creatinine, Ser: 1.11 mg/dL (ref 0.61–1.24)
GFR, Estimated: >60 mL/min (ref >60)
Glucose, Bld: 292 mg/dL — ABNORMAL HIGH (ref 70–99)
Phosphorus: 3.2 mg/dL (ref 2.5–4.6)
Potassium: 4 mmol/L (ref 3.5–5.1)
Sodium: 136 mmol/L (ref 135–145)

## 2022-07-10 MED ORDER — AMOXICILLIN-POT CLAVULANATE 875-125 MG PO TABS: 1.0000 | ORAL_TABLET | Freq: Two times a day (BID) | ORAL | 0 refills | Status: AC

## 2022-07-10 MED ORDER — ASCORBIC ACID 500 MG PO TABS: 500.0000 mg | ORAL_TABLET | Freq: Two times a day (BID) | ORAL | 0 refills | Status: AC

## 2022-07-10 MED ORDER — ZINC SULFATE 220 (50 ZN) MG PO CAPS: 220.0000 mg | ORAL_CAPSULE | Freq: Every day | ORAL | 0 refills | Status: AC

## 2022-07-10 MED ORDER — OXYCODONE HCL 5 MG PO TABS: 5.0000 mg | ORAL_TABLET | ORAL | 0 refills | Status: AC | PRN

## 2022-07-10 MED ORDER — ACETAMINOPHEN 500 MG PO TABS: 1000.0000 mg | ORAL_TABLET | Freq: Four times a day (QID) | ORAL | 0 refills | Status: AC | PRN

## 2022-07-10 MED ORDER — ESCITALOPRAM OXALATE 10 MG PO TABS: 10.0000 mg | ORAL_TABLET | Freq: Every day | ORAL | 0 refills | Status: AC

## 2022-07-10 MED ORDER — METHOCARBAMOL 500 MG PO TABS: 500.0000 mg | ORAL_TABLET | Freq: Three times a day (TID) | ORAL | 0 refills | Status: AC | PRN

## 2022-07-10 NOTE — Discharge Summary (Signed)
Physician Discharge Summary  [name]:[id number] DOB: 10/03/1990 DOA: 06/29/2022  PCP: Roe Rutherford, NP  Admit date: 06/29/2022 Discharge date: 07/10/2022  Admitted From: Home Disposition: Home  Recommendations for Outpatient Follow-up:  Follow up with PCP in 1 week  Outpatient follow-up with general surgery and urology.  Wound care as per general surgery/urology recommendations. Outpatient follow-up with endocrinology Follow up in ED if symptoms worsen or new appear   Home Health: No Equipment/Devices: None  Discharge Condition: Stable CODE STATUS: Full Diet recommendation: Carb modified  Brief/Interim Summary: 32 year old male with history of diabetes mellitus type 1 (used to be on insulin pump in the past), depression presented to [facility] for buttock abscess and was subsequently transferred to [facility].  He was started on IV antibiotics.  General surgery and urology were consulted.  During the hospitalization, he has had 3 surgical interventions with debridement by urology/general surgery.  OR cultures grew group B strep; blood cultures negative so far.  ID consulted and antibiotics changed as per ID.  He is currently on oral Augmentin and ID recommends to continue Augmentin till 07/14/2022.  He is currently hemodynamically stable.  No more surgical interventions planned during this hospitalization.  General surgery and urology have cleared the patient for discharge.  He will be discharged home today.  Discharge Diagnoses:   Sepsis: Present on admission; resolved Foreigners gangrene/buttock abscess -Status post  surgical interventions with debridement by urology/general surgery.  OR cultures grew group B strep; blood cultures negative so far.  ID consulted and antibiotics changed as per ID.  He is currently on oral Augmentin and ID recommends to continue Augmentin till 07/14/2022.  He is currently hemodynamically stable.  No more surgical interventions  planned during this hospitalization.  General surgery and urology have cleared the patient for discharge.  He will be discharged home today on oral Augmentin till 07/14/2022. -Outpatient follow-up with general surgery and urology.  Might need outpatient plastic surgery involvement.  Wound care as per them.  Discharge pain management as per general surgery. -Patient also tolerated hydrotherapy during the hospitalization. -Sepsis has resolved.  Currently hemodynamically stable  AKI: Resolved  Leukocytosis -Resolved  Thrombocytosis -Possibly reactive.  Outpatient follow-up  Anemia of chronic disease -From chronic illnesses.  Hemoglobin stable.  Outpatient follow-up  Diabetes mellitus type 1 poorly controlled with hyper and hypoglycemia during this hospitalization -A1c 15.  Continue carb modified diet.  Continue long-acting and short acting insulin.  Outpatient follow-up with endocrinology.  Reciprocal EKG changes -Cardiology evaluated the patient and has signed off.  Depression--continue Lexapro.  Continue as needed Klonopin.  Outpatient follow-up with PCP.  Discharge Instructions  Discharge Instructions     Diet Carb Modified   Complete by: As directed    Discharge wound care:   Complete by: As directed    As per Surgery recommendations   Increase activity slowly   Complete by: As directed       Allergies as of 07/10/2022       Reactions   Levaquin [levofloxacin] Rash        Medication List     TAKE these medications    acetaminophen 500 MG tablet Commonly known as: TYLENOL Take 2 tablets (1,000 mg total) by mouth every 6 (six) hours as needed.   amoxicillin-clavulanate 875-125 MG tablet Commonly known as: AUGMENTIN Take 1 tablet by mouth every 12 (twelve) hours.   ascorbic acid 500 MG tablet Commonly known as: VITAMIN C Take 1 tablet (500 mg  total) by mouth 2 (two) times daily.   clonazePAM 0.5 MG tablet Commonly known as: KLONOPIN Take 0.5 mg by mouth as  needed for anxiety.   escitalopram 10 MG tablet Commonly known as: LEXAPRO Take 1 tablet (10 mg total) by mouth at bedtime. What changed:  medication strength how much to take   ibuprofen 200 MG tablet Commonly known as: ADVIL Take 400 mg by mouth every 6 (six) hours as needed for moderate pain.   insulin lispro 100 UNIT/ML KwikPen Commonly known as: HUMALOG Inject 10-20 Units into the skin in the morning, at noon, in the evening, and at bedtime. Before meals sliding scale   methocarbamol 500 MG tablet Commonly known as: ROBAXIN Take 1 tablet (500 mg total) by mouth every 8 (eight) hours as needed for muscle spasms.   oxyCODONE 5 MG immediate release tablet Commonly known as: Oxy IR/ROXICODONE Take 1 tablet (5 mg total) by mouth every 4 (four) hours as needed for moderate pain.   TOUJEO SOLOSTAR Purple Sage Inject 60 Units into the skin at bedtime.   zinc sulfate 220 (50 Zn) MG capsule Take 1 capsule (220 mg total) by mouth daily.               Discharge Care Instructions  (From admission, onward)           Start     Ordered   07/10/22 0000  Discharge wound care:       Comments: As per Surgery recommendations   07/10/22 1243            Follow-up Information     [facility]              Follow up in 2 week(s).   Why: They will call you to schedule this appointment.  If you do not hear by mid-day Friday, please call to verify Contact information: [address] [place] [address] [phone]        Emelia Loron, MD Follow up in 6 week(s).   Specialty: General Surgery Contact information: [address] [place] [address] [phone]         Roe Rutherford, NP. Schedule an appointment as soon as possible for a visit in 1 week(s).   Specialty: Adult Health Nurse Practitioner Contact information: [address] [place]  [address] [phone]         [facility]. Schedule an appointment as soon as possible for a visit in 1 week(s).   Contact information: [address] [place] [address] [place] [address] [phone]               Allergies  Allergen Reactions   Levaquin [Levofloxacin] Rash    Consultations: General surgery/urology/ID/PCCM/cardiology   Procedures/Studies: ECHOCARDIOGRAM COMPLETE  Result Date: 07/04/2022    ECHOCARDIOGRAM REPORT   Patient Name:   JAINIL DACOSTA Date of Exam: 07/04/2022 Medical Rec #:  [id number]     Height:       67.0 in Accession #:    [id number]    Weight:       222.0 lb Date of Birth:  1990-11-26     BSA:          2.114 m Patient Age:    32 years      BP:           122/70 mmHg Patient Gender: M  HR:           98 bpm. Exam Location:  Inpatient Procedure: 2D Echo, Cardiac Doppler and Color Doppler Indications:    Dyspnea  History:        Patient has no prior history of Echocardiogram examinations.                 Signs/Symptoms:Fever and acute respiratory failure; Risk                 Factors:Diabetes and abscess, sepsis.  Sonographer:    Wallie Char Referring Phys: [id number] Clarene Critchley [facility]  Sonographer Comments: Echo performed with patient supine and on artificial respirator, patient is obese and Technically challenging study due to limited acoustic windows. Image acquisition challenging due to patient body habitus. IMPRESSIONS  1. Left ventricular ejection fraction, by estimation, is 60 to 65%. The left ventricle has normal function. The left ventricle has no regional wall motion abnormalities. Left ventricular diastolic parameters were normal.  2. Right ventricular systolic function is normal. The right ventricular size is mildly enlarged.  3. The mitral valve is normal in structure. No evidence of mitral valve regurgitation. No evidence of mitral stenosis.  4. The aortic valve is normal in structure. Aortic valve regurgitation is not  visualized. No aortic stenosis is present.  5. The inferior vena cava is normal in size with greater than 50% respiratory variability, suggesting right atrial pressure of 3 mmHg. FINDINGS  Left Ventricle: Left ventricular ejection fraction, by estimation, is 60 to 65%. The left ventricle has normal function. The left ventricle has no regional wall motion abnormalities. The left ventricular internal cavity size was normal in size. There is  no left ventricular hypertrophy. Left ventricular diastolic parameters were normal. Right Ventricle: The right ventricular size is mildly enlarged. No increase in right ventricular wall thickness. Right ventricular systolic function is normal. Left Atrium: Left atrial size was normal in size. Right Atrium: Right atrial size was normal in size. Pericardium: There is no evidence of pericardial effusion. Mitral Valve: The mitral valve is normal in structure. No evidence of mitral valve regurgitation. No evidence of mitral valve stenosis. MV peak gradient, 5.5 mmHg. The mean mitral valve gradient is 3.0 mmHg. Tricuspid Valve: The tricuspid valve is normal in structure. Tricuspid valve regurgitation is trivial. No evidence of tricuspid stenosis. Aortic Valve: The aortic valve is normal in structure. Aortic valve regurgitation is not visualized. No aortic stenosis is present. Aortic valve mean gradient measures 7.0 mmHg. Aortic valve peak gradient measures 12.0 mmHg. Aortic valve area, by VTI measures 2.72 cm. Pulmonic Valve: The pulmonic valve was normal in structure. Pulmonic valve regurgitation is trivial. No evidence of pulmonic stenosis. Aorta: The aortic root is normal in size and structure. Venous: The inferior vena cava is normal in size with greater than 50% respiratory variability, suggesting right atrial pressure of 3 mmHg. IAS/Shunts: No atrial level shunt detected by color flow Doppler.  LEFT VENTRICLE PLAX 2D LVIDd:         4.60 cm     Diastology LVIDs:         3.10 cm      LV e' medial:    9.98 cm/s LV PW:         1.00 cm     LV E/e' medial:  11.8 LV IVS:        0.90 cm     LV e' lateral:   11.50 cm/s LVOT diam:     1.90 cm  LV E/e' lateral: 10.3 LV SV:         78 LV SV Index:   37 LVOT Area:     2.84 cm  LV Volumes (MOD) LV vol d, MOD A2C: 82.3 ml LV vol d, MOD A4C: 78.4 ml LV vol s, MOD A2C: 28.0 ml LV vol s, MOD A4C: 27.4 ml LV SV MOD A2C:     54.3 ml LV SV MOD A4C:     78.4 ml LV SV MOD BP:      53.9 ml RIGHT VENTRICLE             IVC RV Basal diam:  4.20 cm     IVC diam: 1.80 cm RV S prime:     17.50 cm/s TAPSE (M-mode): 2.6 cm LEFT ATRIUM             Index        RIGHT ATRIUM           Index LA diam:        3.70 cm 1.75 cm/m   RA Area:     16.90 cm LA Vol (A2C):   25.2 ml 11.92 ml/m  RA Volume:   46.80 ml  22.14 ml/m LA Vol (A4C):   31.0 ml 14.66 ml/m LA Biplane Vol: 28.9 ml 13.67 ml/m  AORTIC VALVE AV Area (Vmax):    2.70 cm AV Area (Vmean):   2.40 cm AV Area (VTI):     2.72 cm AV Vmax:           173.50 cm/s AV Vmean:          126.000 cm/s AV VTI:            0.286 m AV Peak Grad:      12.0 mmHg AV Mean Grad:      7.0 mmHg LVOT Vmax:         165.50 cm/s LVOT Vmean:        106.500 cm/s LVOT VTI:          0.275 m LVOT/AV VTI ratio: 0.96  AORTA Ao Root diam: 3.10 cm Ao Asc diam:  2.80 cm MITRAL VALVE                TRICUSPID VALVE MV Area (PHT): 3.56 cm     TR Peak grad:   15.7 mmHg MV Area VTI:   3.00 cm     TR Vmax:        198.00 cm/s MV Peak grad:  5.5 mmHg MV Mean grad:  3.0 mmHg     SHUNTS MV Vmax:       1.17 m/s     Systemic VTI:  0.28 m MV Vmean:      74.4 cm/s    Systemic Diam: 1.90 cm MV Decel Time: 213 msec MV E velocity: 118.00 cm/s MV A velocity: 80.60 cm/s MV E/A ratio:  1.46 Arvilla Meres MD Electronically signed by Arvilla Meres MD Signature Date/Time: 07/04/2022/12:21:01 PM    Final    [place] EKG SITE RITE  Result Date: 07/03/2022 If Site Rite image not attached, placement could not be confirmed due to current cardiac rhythm.  DG Chest Port 1  View  Result Date: 07/03/2022 CLINICAL DATA:  Hypoxia EXAM: PORTABLE CHEST 1 VIEW COMPARISON:  07/02/2022 FINDINGS: Endotracheal tube and gastric catheter are again seen and stable. Cardiac shadow is unchanged. Persistent changes of edema are noted bilaterally. No bony abnormality is seen. IMPRESSION: Diffuse pulmonary edema. Electronically Signed  By: Alcide Clever M.D.   On: 07/03/2022 03:06   DG Abd 1 View  Result Date: 07/02/2022 CLINICAL DATA:  Placement of NG tube EXAM: ABDOMEN - 1 VIEW COMPARISON:  01/17/2010 FINDINGS: Tip of NG tube is seen in the region of the antrum of the stomach. Bowel gas pattern is nonspecific. IMPRESSION: Tip of NG tube is seen in the antrum of the stomach. Electronically Signed   By: Ernie Avena M.D.   On: 07/02/2022 15:30   DG CHEST PORT 1 VIEW  Result Date: 07/02/2022 CLINICAL DATA:  Difficulty breathing, endotracheal tube placement EXAM: PORTABLE CHEST 1 VIEW COMPARISON:  Previous studies including the examination done earlier today FINDINGS: Transverse diameter of heart is increased. Diffuse alveolar densities are seen in both lungs which was not seen on the previous examination. Lateral costophrenic angles are indistinct. There is no pneumothorax. Tip of endotracheal tube is 7.5 cm above the carina. NG tube is noted traversing the esophagus with its distal portion in the stomach. IMPRESSION: There is interval appearance of alveolar densities in both lungs suggesting pulmonary edema. Part of this finding may be due to atelectasis. Tip of endotracheal tube is in the lower neck 7.5 cm above the carina. Electronically Signed   By: Ernie Avena M.D.   On: 07/02/2022 15:30   DG CHEST PORT 1 VIEW  Result Date: 07/02/2022 CLINICAL DATA:  Hypoxia. EXAM: PORTABLE CHEST 1 VIEW COMPARISON:  October 05, 2014. FINDINGS: The heart size and mediastinal contours are within normal limits. Both lungs are clear. The visualized skeletal structures are unremarkable.  IMPRESSION: No active disease. Electronically Signed   By: Lupita Raider M.D.   On: 07/02/2022 13:50   CT ABDOMEN PELVIS W CONTRAST  Result Date: 06/29/2022 CLINICAL DATA:  Sepsis EXAM: CT ABDOMEN AND PELVIS WITH CONTRAST TECHNIQUE: Multidetector CT imaging of the abdomen and pelvis was performed using the standard protocol following bolus administration of intravenous contrast. RADIATION DOSE REDUCTION: This exam was performed according to the departmental dose-optimization program which includes automated exposure control, adjustment of the mA and/or kV according to patient size and/or use of iterative reconstruction technique. CONTRAST:  OMNIPAQUE IOHEXOL 300 MG/ML  SOLN COMPARISON:  04/13/2013 FINDINGS: Lower chest: No pleural effusion. No pneumothorax. Patchy atelectasis or infiltrate posteriorly at the left lung base. Hepatobiliary: No focal liver abnormality is seen. No gallstones, gallbladder wall thickening, or biliary dilatation. Pancreas: Unremarkable. No pancreatic ductal dilatation or surrounding inflammatory changes. Spleen: Normal in size without focal abnormality. Adrenals/Urinary Tract: No adrenal mass. Symmetric renal enhancement without focal lesion, urolithiasis, or hydronephrosis. Urinary bladder is distended. Stomach/Bowel: Stomach incompletely distended, unremarkable. A few gas distended small bowel loops in the left mid abdomen, the proximal and distal loops are decompressed and unremarkable. Normal appendix. Colon is partially distended by gas and fecal material, unremarkable. Vascular/Lymphatic: No significant vascular findings are present. No enlarged abdominal or pelvic lymph nodes. Reproductive: Prostate is unremarkable. Other: No ascites.  No free air. 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 Mild vertebral compression deformities T10 and T12, stable since previous. No acute findings. IMPRESSION: 1. No acute abdominal findings. 2. Patchy atelectasis or  infiltrate posteriorly at the left lung base. Electronically Signed   By: Corlis Leak M.D.   On: 06/29/2022 21:58      Subjective: Patient seen and examined at bedside.  Denies any fever, nausea, vomiting.  Wants to go home today.  Discharge Exam: Vitals:   07/10/22 0038 07/10/22 0533  BP: 136/81 116/72  Pulse: 83 90  Resp: 14 15  Temp: 99 F (37.2 C) 98.8 F (37.1 C)  SpO2: 94% 93%    General: Pt is alert, awake, not in acute distress.  On room air. Cardiovascular: rate controlled, S1/S2 + Respiratory: bilateral decreased breath sounds at bases Abdominal: Soft, NT, ND, bowel sounds + Extremities: no edema, no cyanosis    The results of significant diagnostics from this hospitalization (including imaging, microbiology, ancillary and laboratory) are listed below for reference.     Microbiology: Recent Results (from the past 240 hour(s))  Aerobic/Anaerobic Culture w Gram Stain (surgical/deep wound)     Status: None   Collection Time: 06/30/22  1:39 PM   Specimen: Path fluid; Abscess  Result Value Ref Range Status   Specimen Description   Final    ABSCESS PERIRECTAL Performed at [facility], [address], [place], [place] [address]    Special Requests   Final    NONE Performed at [facility], [address], [place], [place] [address]    Gram Stain   Final    NO WBC SEEN MODERATE GRAM POSITIVE COCCI MODERATE GRAM NEGATIVE RODS RARE GRAM POSITIVE RODS    Culture   Final    RARE GROUP B STREP(S.AGALACTIAE)ISOLATED TESTING AGAINST S. AGALACTIAE NOT ROUTINELY PERFORMED DUE TO PREDICTABILITY OF AMP/PEN/VAN SUSCEPTIBILITY. FEW PEPTOSTREPTOCOCCUS ANAEROBIUS Standardized susceptibility testing for this organism is not available. RARE PREVOTELLA BUCCAE BETA LACTAMASE POSITIVE Performed at [facility], [address] [place], [place] [address]    Report Status 07/07/2022 FINAL  Final  Culture, Respiratory w Gram Stain      Status: None   Collection Time: 07/03/22 10:36 AM   Specimen: Tracheal Aspirate; Respiratory  Result Value Ref Range Status   Specimen Description   Final    TRACHEAL ASPIRATE Performed at [facility], [address], [place], [place] [address]    Special Requests   Final    NONE Performed at [facility], [address] [place], [place] [address]    Gram Stain   Final    ABUNDANT WBC PRESENT,BOTH PMN AND MONONUCLEAR NO ORGANISMS SEEN    Culture   Final    NO GROWTH 3 DAYS Performed at [facility], [address] [place], [place] [address]    Report Status 07/06/2022 FINAL  Final     Labs: BNP (last 3 results) Recent Labs    07/02/22 0816  BNP 342.1*   Basic Metabolic Panel: Recent Labs  Lab 07/04/22 1700 07/05/22 0424 07/06/22 0402 07/07/22 0305 07/08/22 0314 07/10/22 0234  NA  --  140 139 139 136 136  K  --  3.4* 4.2 4.1 4.0 4.0  CL  --  107 105 101 98 97*  CO2  --  24 23 27 27 31   GLUCOSE  --  118* 205* 211* 266* 292*  BUN  --  13 11 10 10 13   CREATININE  --  0.94 1.11 1.08 1.02 1.11  CALCIUM  --  7.2* 8.0* 8.2* 8.1* 8.0*  MG 1.7 1.9 1.9 1.5* 1.5*  --   PHOS 2.9 3.0 2.9 2.7 2.8 3.2   Liver Function Tests: Recent Labs  Lab 07/10/22 0234  ALBUMIN 2.1*   No results for input(s): "LIPASE", "AMYLASE" in the last 168 hours. No results for input(s): "AMMONIA" in the last 168 hours. CBC: Recent Labs  Lab 07/06/22 0402 07/07/22 0305 07/08/22 0314 07/09/22 0330 07/10/22 0234  WBC 15.4* 14.7* 12.0* 12.3* 10.3  NEUTROABS  --   --   --   --  5.7  HGB 9.7* 9.5* 9.4* 9.7* 9.7*  HCT 30.6* 30.2* 29.7* 30.8* 30.6*  MCV 89.7 89.3 88.9 88.3 88.2  PLT 474* 485* 473* 527* 515*   Cardiac Enzymes: No results for input(s): "CKTOTAL", "CKMB", "CKMBINDEX", "TROPONINI" in the last 168 hours. BNP: Invalid input(s): "POCBNP" CBG: Recent Labs  Lab 07/09/22 1226 07/09/22 1645 07/09/22 2019 07/10/22 0804  07/10/22 1122  GLUCAP 268* 165* 172* 228* 237*   D-Dimer No results for input(s): "DDIMER" in the last 72 hours. Hgb A1c No results for input(s): "HGBA1C" in the last 72 hours. Lipid Profile No results for input(s): "CHOL", "HDL", "LDLCALC", "TRIG", "CHOLHDL", "LDLDIRECT" in the last 72 hours. Thyroid function studies No results for input(s): "TSH", "T4TOTAL", "T3FREE", "THYROIDAB" in the last 72 hours.  Invalid input(s): "FREET3" Anemia work up No results for input(s): "VITAMINB12", "FOLATE", "FERRITIN", "TIBC", "IRON", "RETICCTPCT" in the last 72 hours. Urinalysis    Component Value Date/Time   COLORURINE YELLOW 06/29/2022 1630   APPEARANCEUR CLEAR 06/29/2022 1630   LABSPEC 1.018 06/29/2022 1630   PHURINE 5.0 06/29/2022 1630   GLUCOSEU >=500 (A) 06/29/2022 1630   HGBUR MODERATE (A) 06/29/2022 1630   BILIRUBINUR NEGATIVE 06/29/2022 1630   KETONESUR NEGATIVE 06/29/2022 1630   PROTEINUR 100 (A) 06/29/2022 1630   UROBILINOGEN 0.2 03/11/2013 1530   NITRITE NEGATIVE 06/29/2022 1630   LEUKOCYTESUR NEGATIVE 06/29/2022 1630   Sepsis Labs Recent Labs  Lab 07/07/22 0305 07/08/22 0314 07/09/22 0330 07/10/22 0234  WBC 14.7* 12.0* 12.3* 10.3   Microbiology Recent Results (from the past 240 hour(s))  Aerobic/Anaerobic Culture w Gram Stain (surgical/deep wound)     Status: None   Collection Time: 06/30/22  1:39 PM   Specimen: Path fluid; Abscess  Result Value Ref Range Status   Specimen Description   Final    ABSCESS PERIRECTAL Performed at [facility], [address] [place], [place] [address]    Special Requests   Final    NONE Performed at [facility], [address], [place], [place] [address]    Gram Stain   Final    NO WBC SEEN MODERATE GRAM POSITIVE COCCI MODERATE GRAM NEGATIVE RODS RARE GRAM POSITIVE RODS    Culture   Final    RARE GROUP B STREP(S.AGALACTIAE)ISOLATED TESTING AGAINST S. AGALACTIAE NOT ROUTINELY PERFORMED  DUE TO PREDICTABILITY OF AMP/PEN/VAN SUSCEPTIBILITY. FEW PEPTOSTREPTOCOCCUS ANAEROBIUS Standardized susceptibility testing for this organism is not available. RARE PREVOTELLA BUCCAE BETA LACTAMASE POSITIVE Performed at [facility], [address], [place], [place] [address]    Report Status 07/07/2022 FINAL  Final  Culture, Respiratory w Gram Stain     Status: None   Collection Time: 07/03/22 10:36 AM   Specimen: Tracheal Aspirate; Respiratory  Result Value Ref Range Status   Specimen Description   Final    TRACHEAL ASPIRATE Performed at [facility], [address], [place], [place] [address]    Special Requests   Final    NONE Performed at [facility], [address] [place], [place] [address]    Gram Stain   Final    ABUNDANT WBC PRESENT,BOTH PMN AND MONONUCLEAR NO ORGANISMS SEEN    Culture   Final    NO GROWTH 3 DAYS Performed at [facility], [address] [place], [place] [address]    Report Status 07/06/2022 FINAL  Final     Time coordinating discharge: 35 minutes  SIGNED:   Glade Lloyd, MD  [facility] 07/10/2022, 12:44 PM

## 2022-07-10 NOTE — Discharge Instructions (Signed)
WOUND CARE: - midline dressing to be changed daily - supplies: saline, gauze, scissors, tape  - remove dressing and all packing carefully, moistening with sterile saline as needed to avoid packing/internal dressing sticking to the wound. - clean edges of skin around the wound with water/gauze, making sure there is no tape debris or leakage left on skin that could cause skin irritation or breakdown. - dampen and clean gauze with sterile saline and pack wound from wound base to skin level, making sure to take note of any possible areas of wound tracking, tunneling and packing appropriately. Wound can be packed loosely. Trim gauze to size if a whole gauze is not required. - cover wound with a dry gauze and secure with tape.  - write the date/time on the dry dressing/tape to better track when the last dressing change occurred. - change dressing as needed if leakage occurs, wound gets contaminated, or patient requests to shower. - patient may shower daily with wound open (i.e. remove all packing) and following the shower the wound should be dried and a clean dressing placed.

## 2022-07-10 NOTE — Progress Notes (Signed)
6 Days Post-Op   Subjective/Chief Complaint: No new complaints today.   Objective: Vital signs in last 24 hours: Temp:  [98.2 F (36.8 C)-99.2 F (37.3 C)] 98.8 F (37.1 C) (06/13 0533) Pulse Rate:  [83-104] 90 (06/13 0533) Resp:  [14-18] 15 (06/13 0533) BP: (116-136)/(62-81) 116/72 (06/13 0533) SpO2:  [93 %-96 %] 93 % (06/13 0533) Last BM Date : 07/09/22  Intake/Output from previous day: 06/12 0701 - 06/13 0700 In: 1203 [P.O.:1200; I.V.:3] Out: 2525 [Urine:2525] Intake/Output this shift: No intake/output data recorded.  GU: wound is stable and improving with decrease fibrin    Lab Results:  Recent Labs    07/09/22 0330 07/10/22 0234  WBC 12.3* 10.3  HGB 9.7* 9.7*  HCT 30.8* 30.6*  PLT 527* 515*   BMET Recent Labs    07/08/22 0314 07/10/22 0234  NA 136 136  K 4.0 4.0  CL 98 97*  CO2 27 31  GLUCOSE 266* 292*  BUN 10 13  CREATININE 1.02 1.11  CALCIUM 8.1* 8.0*   PT/INR No results for input(s): "LABPROT", "INR" in the last 72 hours. ABG No results for input(s): "PHART", "HCO3" in the last 72 hours.  Invalid input(s): "PCO2", "PO2"   Studies/Results: No results found.  Anti-infectives: Anti-infectives (From admission, onward)    Start     Dose/Rate Route Frequency Ordered Stop   07/08/22 1000  amoxicillin-clavulanate (AUGMENTIN) 875-125 MG per tablet 1 tablet        1 tablet Oral Every 12 hours 07/08/22 0834 07/15/22 0959   07/07/22 1500  amoxicillin (AMOXIL) capsule 250 mg        250 mg Oral  Once 07/07/22 1358 07/07/22 1642   07/02/22 1545  metroNIDAZOLE (FLAGYL) IVPB 500 mg  Status:  Discontinued        500 mg 100 mL/hr over 60 Minutes Intravenous Every 12 hours 07/02/22 1452 07/08/22 0834   07/02/22 1530  amoxicillin (AMOXIL) capsule 250 mg  Status:  Discontinued        250 mg Oral  Once 07/02/22 1430 07/02/22 1452   07/02/22 1530  metroNIDAZOLE (FLAGYL) tablet 500 mg  Status:  Discontinued        500 mg Oral Every 12 hours 07/02/22 1436  07/02/22 1452   07/02/22 1433  cefTRIAXone (ROCEPHIN) 2 g in sodium chloride 0.9 % 100 mL IVPB  Status:  Discontinued        2 g 200 mL/hr over 30 Minutes Intravenous Every 24 hours 07/02/22 1434 07/08/22 0834   07/02/22 1430  metroNIDAZOLE (FLAGYL) IVPB 500 mg  Status:  Discontinued        500 mg 100 mL/hr over 60 Minutes Intravenous Every 12 hours 07/02/22 1400 07/02/22 1436   07/02/22 1200  amoxicillin (AMOXIL) capsule 500 mg  Status:  Discontinued        500 mg Oral  Once 07/02/22 1018 07/02/22 1430   07/02/22 1115  cefTRIAXone (ROCEPHIN) 2 g in sodium chloride 0.9 % 100 mL IVPB  Status:  Discontinued        2 g 200 mL/hr over 30 Minutes Intravenous Every 24 hours 07/02/22 1018 07/02/22 1434   07/01/22 2000  vancomycin (VANCOREADY) IVPB 1750 mg/350 mL  Status:  Discontinued        1,750 mg 175 mL/hr over 120 Minutes Intravenous Every 36 hours 06/30/22 1801 07/02/22 1018   07/01/22 0000  ceFEPIme (MAXIPIME) 2 g in sodium chloride 0.9 % 100 mL IVPB  Status:  Discontinued  2 g 200 mL/hr over 30 Minutes Intravenous Every 12 hours 06/30/22 1801 07/02/22 1018   06/30/22 2100  vancomycin (VANCOREADY) IVPB 750 mg/150 mL  Status:  Discontinued        750 mg 150 mL/hr over 60 Minutes Intravenous Every 12 hours 06/30/22 0718 06/30/22 1801   06/30/22 0800  vancomycin (VANCOREADY) IVPB 2000 mg/400 mL        2,000 mg 200 mL/hr over 120 Minutes Intravenous NOW 06/30/22 0705 06/30/22 1222   06/30/22 0800  ceFEPIme (MAXIPIME) 2 g in sodium chloride 0.9 % 100 mL IVPB  Status:  Discontinued        2 g 200 mL/hr over 30 Minutes Intravenous Every 8 hours 06/30/22 0708 06/30/22 1801   06/29/22 1800  cefTRIAXone (ROCEPHIN) 2 g in sodium chloride 0.9 % 100 mL IVPB        2 g 200 mL/hr over 30 Minutes Intravenous  Once 06/29/22 1747 06/29/22 1910       Assessment/Plan: POD 8, s/p I&D of perirectal abscess, POD 6, s/pdebridement NSTI, Dr. Dwain Sarna -WBC stable -cont abx therapy per ID recs, to  stop by 6/17 -cx with Group B strep, ID has seen him -wound has cleaned up very  nicely with hydrotherapy and dressing changes -patient would prefer to not return to OR so nothing further to add urologically or from a plastics standpoint.  D/w plastics on the phone today.  They reviewed his imaging and his case with me. -will arrange for outpatient follow up between the wound care center as well as [facility]. -I have written a work note for him to be out at least 4 weeks to start, but he may require longer.  He does not have short-term disability -Rx for pain medications have been sent to his pharmacy. -awaiting to hear from [facility] if we will be able to get [facility] arranged.  If not, wife has been shown how to do his wound care. -d/w primary service as well    FEN -  CM diet, just stool softener, hold miralax to minimize diarrhea VTE - Lovenox ID - Augmentin through 6/17   Type I diabetes  Letha Cape, PA-C 07/10/2022

## 2022-07-10 NOTE — TOC Progression Note (Signed)
Transition of Care [facility]) - Progression Note    Patient Details  Name: [name] MRN: [id number] Date of Birth: [birth year]  Transition [facility] CM/SW Contact  Larrie Kass, LCSW Phone Number: 07/10/2022, 1:29 PM  Clinical Narrative:   CSW received consult regarding Home Health services, CSW has reached out to:  [facility]-declined [facility]-declined [facility]-declined [facility] home health -declined [facility]- declined [facility]. declined  CSW spoke with pt and his wife to inform him of the barriers to [facility] services. CSW informed pt he will need to follow up with the wound care center. Pt will need to be taught dressing changes before d/c. No further Toc needs TOC sign off.     Expected Discharge Plan and Services         Expected Discharge Date: 07/10/22                                     Social Determinants of Health (SDOH) Interventions SDOH Screenings   Food Insecurity: No Food Insecurity (06/30/2022)  Housing: Low Risk  (06/30/2022)  Transportation Needs: No Transportation Needs (06/30/2022)  Utilities: Not At Risk (06/30/2022)  Tobacco Use: Low Risk  (07/06/2022)    Readmission Risk Interventions     No data to display

## 2022-07-10 NOTE — Progress Notes (Signed)
   PT NOTE HYDROTHERAPY TX 07/10/22 1300  Subjective Assessment  Subjective agreeable to wound care  Date of Onset 06/30/22   07/10/22--wound is progressing well. Notable incr granulation tissue and decreasing slough. Pt reports minimal exudate. Educated pt and wife on observing peri-wound and avoiding maceration, dressing changes, fluff packing, wound healing stages. Pt and wife feel they can manage dressing changes at home, would benefit from [facility] care checks. No further need for pulsed lavage, pt will likely d/c today   Prior Treatments s/p surgical I&D x 2  Evaluation and Treatment  Evaluation and Treatment Procedures Explained to Patient/Family Yes  Evaluation and Treatment Procedures agreed to  Wound / Incision (Open or Dehisced) 07/07/22 Incision - Open Groin Left **PT ONLY** open wound after repeated surgical  I&D x2  Date First Assessed: 07/07/22   Wound Type: Incision - Open  Location: (c) Groin  Location Orientation: Left  Wound Description (Comments): **PT ONLY** open wound after repeated surgical  I&D x2  Present on Admission: No  Wound Image   Dressing Type Gauze (Comment);ABD;Mesh briefs;Normal saline moist dressing  Dressing Change Frequency Daily  Site / Wound Assessment Granulation tissue;Painful;Red;Yellow  % Wound base Red or Granulating 70%  % Wound base Yellow/Fibrinous Exudate 30%  Peri-wound Assessment Intact  Closure None  Drainage Description No odor  Treatment Debridement (Selective);Packing (Saline gauze);Hydrotherapy (Pulse lavage)  Hydrotherapy  Pulsed Lavage with Suction (psi) 8 psi (8-12psi)  Pulsed Lavage with Suction - Normal Saline Used 1000 mL  Pulsed Lavage Tip Tip with splash shield  Pulsed lavage therapy - wound location perineum and L scrotum  Selective Debridement (non-excisional)  Selective Debridement (non-excisional) - Location scrotum, perineum extending to L gluteal area  Selective Debridement (non-excisional) - Tools Used Other  (comment) (gauze sweep)  Selective Debridement (non-excisional) - Tissue Removed loose yellow slough  Wound Therapy - Assess/Plan/Recommendations  Wound Therapy - Clinical Statement pt will benefit from hydrotherapy to facilitate wound healing/closure,  decr bioburden  Wound Therapy - Functional Problem List painful mobility  Factors Delaying/Impairing Wound Healing Diabetes Mellitus;Other (comment)  Hydrotherapy Plan Debridement;Dressing change;Patient/family education;Pulsatile lavage with suction  Wound Therapy - Frequency  (4x then likely d/c)  Wound Therapy - Follow Up Recommendations dressing changes by family/patient;dressing changes by RN  Wound Therapy Goals - Improve the function of patient's integumentary system by progressing the wound(s) through the phases of wound healing by:  Decrease Necrotic Tissue to 25  Decrease Necrotic Tissue - Progress Progressing toward goal  Increase Granulation Tissue to 75  Increase Granulation Tissue - Progress Progressing toward goal  Decrease Length/Width/Depth by (cm) .2/.2/.3  Decrease Length/Width/Depth - Progress Progressing toward goal  Improve Drainage Characteristics Min  Improve Drainage Characteristics - Progress Progressing toward goal  Patient/Family will be able to  verbalize dressing changes  Patient/Family Instruction Goal - Progress Progressing toward goal  Goals/treatment plan/discharge plan were made with and agreed upon by patient/family Yes  Time For Goal Achievement 2 weeks  Wound Therapy - Potential for Goals Excellent   Delice Bison, PT  Acute Rehab Dept [facility]) [phone]  07/10/2022

## 2022-07-10 NOTE — Plan of Care (Signed)
  Problem: Education: Goal: Knowledge of General Education information will improve Description: Including pain rating scale, medication(s)/side effects and non-pharmacologic comfort measures Outcome: Adequate for Discharge   Problem: Health Behavior/Discharge Planning: Goal: Ability to manage health-related needs will improve Outcome: Progressing   Problem: Clinical Measurements: Goal: Ability to maintain clinical measurements within normal limits will improve Outcome: Adequate for Discharge Goal: Will remain free from infection Outcome: Adequate for Discharge Goal: Diagnostic test results will improve Outcome: Adequate for Discharge Goal: Respiratory complications will improve Outcome: Adequate for Discharge Goal: Cardiovascular complication will be avoided Outcome: Adequate for Discharge   Problem: Activity: Goal: Risk for activity intolerance will decrease Outcome: Adequate for Discharge   Problem: Nutrition: Goal: Adequate nutrition will be maintained Outcome: Adequate for Discharge   Problem: Coping: Goal: Level of anxiety will decrease Outcome: Adequate for Discharge   Problem: Elimination: Goal: Will not experience complications related to bowel motility Outcome: Adequate for Discharge Goal: Will not experience complications related to urinary retention Outcome: Adequate for Discharge   Problem: Pain Managment: Goal: General experience of comfort will improve Outcome: Adequate for Discharge   Problem: Safety: Goal: Ability to remain free from injury will improve Outcome: Adequate for Discharge   Problem: Skin Integrity: Goal: Risk for impaired skin integrity will decrease Outcome: Adequate for Discharge   Problem: Education: Goal: Ability to describe self-care measures that may prevent or decrease complications (Diabetes Survival Skills Education) will improve Outcome: Adequate for Discharge Goal: Individualized Educational Video(s) Outcome: Adequate for  Discharge   Problem: Coping: Goal: Ability to adjust to condition or change in health will improve Outcome: Adequate for Discharge   Problem: Fluid Volume: Goal: Ability to maintain a balanced intake and output will improve Outcome: Adequate for Discharge   Problem: Health Behavior/Discharge Planning: Goal: Ability to identify and utilize available resources and services will improve Outcome: Adequate for Discharge Goal: Ability to manage health-related needs will improve Outcome: Adequate for Discharge   Problem: Metabolic: Goal: Ability to maintain appropriate glucose levels will improve Outcome: Adequate for Discharge   Problem: Nutritional: Goal: Maintenance of adequate nutrition will improve Outcome: Adequate for Discharge Goal: Progress toward achieving an optimal weight will improve Outcome: Adequate for Discharge   Problem: Skin Integrity: Goal: Risk for impaired skin integrity will decrease Outcome: Adequate for Discharge   Problem: Tissue Perfusion: Goal: Adequacy of tissue perfusion will improve Outcome: Adequate for Discharge

## 2022-08-06 ENCOUNTER — Ambulatory Visit (HOSPITAL_BASED_OUTPATIENT_CLINIC_OR_DEPARTMENT_OTHER): Payer: BLUE CROSS/BLUE SHIELD | Admitting: Physician Assistant
